# Patient Record
Sex: Male | Born: 1935 | Race: White | Hispanic: No | Marital: Married | Smoking: Former smoker
Health system: Southern US, Community
[De-identification: ages and names within clinical notes are randomized; demographics above are authoritative.]

## PROBLEM LIST (undated history)

## (undated) DIAGNOSIS — N189 Chronic kidney disease, unspecified: Secondary | ICD-10-CM

## (undated) DIAGNOSIS — C801 Malignant (primary) neoplasm, unspecified: Secondary | ICD-10-CM

## (undated) DIAGNOSIS — I10 Essential (primary) hypertension: Secondary | ICD-10-CM

## (undated) DIAGNOSIS — C7800 Secondary malignant neoplasm of unspecified lung: Secondary | ICD-10-CM

## (undated) DIAGNOSIS — C159 Malignant neoplasm of esophagus, unspecified: Secondary | ICD-10-CM

## (undated) DIAGNOSIS — C61 Malignant neoplasm of prostate: Secondary | ICD-10-CM

## (undated) DIAGNOSIS — Z5189 Encounter for other specified aftercare: Secondary | ICD-10-CM

## (undated) DIAGNOSIS — M1990 Unspecified osteoarthritis, unspecified site: Secondary | ICD-10-CM

## (undated) DIAGNOSIS — C649 Malignant neoplasm of unspecified kidney, except renal pelvis: Secondary | ICD-10-CM

## (undated) DIAGNOSIS — D649 Anemia, unspecified: Secondary | ICD-10-CM

## (undated) HISTORY — DX: Malignant neoplasm of esophagus, unspecified: C15.9

## (undated) HISTORY — PX: COLON SURGERY: SHX602

## (undated) HISTORY — PX: BLADDER SURGERY: SHX569

## (undated) HISTORY — PX: VASCULAR SURGERY: SHX849

## (undated) HISTORY — PX: JOINT REPLACEMENT: SHX530

## (undated) HISTORY — DX: Chronic kidney disease, unspecified: N18.9

## (undated) HISTORY — PX: APPENDECTOMY: SHX54

## (undated) HISTORY — DX: Malignant (primary) neoplasm, unspecified: C80.1

## (undated) HISTORY — PX: ARCUATE KERATECTOMY: SHX212

## (undated) HISTORY — DX: Encounter for other specified aftercare: Z51.89

## (undated) HISTORY — PX: NEPHRECTOMY: SHX65

## (undated) HISTORY — PX: PROSTATE BIOPSY: SHX241

## (undated) HISTORY — DX: Unspecified osteoarthritis, unspecified site: M19.90

## (undated) HISTORY — DX: Essential (primary) hypertension: I10

---

## 1999-03-30 ENCOUNTER — Encounter: Payer: Self-pay | Admitting: Orthopaedic Surgery

## 1999-04-02 ENCOUNTER — Inpatient Hospital Stay (HOSPITAL_COMMUNITY): Admission: RE | Admit: 1999-04-02 | Discharge: 1999-04-06 | Payer: Self-pay | Admitting: Orthopaedic Surgery

## 1999-04-06 ENCOUNTER — Inpatient Hospital Stay (HOSPITAL_COMMUNITY)
Admission: RE | Admit: 1999-04-06 | Discharge: 1999-04-11 | Payer: Self-pay | Admitting: Physical Medicine & Rehabilitation

## 1999-04-09 ENCOUNTER — Encounter: Payer: Self-pay | Admitting: Physical Medicine & Rehabilitation

## 1999-04-16 ENCOUNTER — Encounter
Admission: RE | Admit: 1999-04-16 | Discharge: 1999-05-25 | Payer: Self-pay | Admitting: Physical Medicine & Rehabilitation

## 1999-08-20 ENCOUNTER — Encounter: Payer: Self-pay | Admitting: Orthopaedic Surgery

## 1999-08-20 ENCOUNTER — Inpatient Hospital Stay (HOSPITAL_COMMUNITY): Admission: RE | Admit: 1999-08-20 | Discharge: 1999-08-26 | Payer: Self-pay | Admitting: Orthopaedic Surgery

## 1999-09-09 ENCOUNTER — Encounter: Admission: RE | Admit: 1999-09-09 | Discharge: 1999-10-07 | Payer: Self-pay | Admitting: Orthopaedic Surgery

## 2000-09-19 ENCOUNTER — Encounter: Payer: Self-pay | Admitting: Orthopaedic Surgery

## 2000-09-22 ENCOUNTER — Inpatient Hospital Stay (HOSPITAL_COMMUNITY): Admission: RE | Admit: 2000-09-22 | Discharge: 2000-09-27 | Payer: Self-pay | Admitting: Orthopaedic Surgery

## 2000-10-06 ENCOUNTER — Ambulatory Visit (HOSPITAL_COMMUNITY): Admission: RE | Admit: 2000-10-06 | Discharge: 2000-10-06 | Payer: Self-pay | Admitting: Orthopaedic Surgery

## 2001-03-30 ENCOUNTER — Other Ambulatory Visit: Admission: RE | Admit: 2001-03-30 | Discharge: 2001-03-30 | Payer: Self-pay | Admitting: Urology

## 2001-03-30 ENCOUNTER — Encounter (INDEPENDENT_AMBULATORY_CARE_PROVIDER_SITE_OTHER): Payer: Self-pay | Admitting: Specialist

## 2001-04-10 ENCOUNTER — Encounter: Admission: RE | Admit: 2001-04-10 | Discharge: 2001-04-10 | Payer: Self-pay | Admitting: Urology

## 2001-04-10 ENCOUNTER — Encounter: Payer: Self-pay | Admitting: Urology

## 2001-04-14 ENCOUNTER — Ambulatory Visit: Admission: RE | Admit: 2001-04-14 | Discharge: 2001-07-13 | Payer: Self-pay | Admitting: Radiation Oncology

## 2001-04-27 ENCOUNTER — Encounter: Admission: RE | Admit: 2001-04-27 | Discharge: 2001-04-27 | Payer: Self-pay | Admitting: Urology

## 2001-04-27 ENCOUNTER — Encounter: Payer: Self-pay | Admitting: Urology

## 2001-06-12 ENCOUNTER — Encounter: Payer: Self-pay | Admitting: Urology

## 2001-06-12 ENCOUNTER — Ambulatory Visit (HOSPITAL_COMMUNITY): Admission: RE | Admit: 2001-06-12 | Discharge: 2001-06-12 | Payer: Self-pay | Admitting: Urology

## 2001-06-27 ENCOUNTER — Encounter: Admission: RE | Admit: 2001-06-27 | Discharge: 2001-06-27 | Payer: Self-pay | Admitting: Urology

## 2001-06-27 ENCOUNTER — Encounter: Payer: Self-pay | Admitting: Urology

## 2001-07-28 ENCOUNTER — Ambulatory Visit (HOSPITAL_BASED_OUTPATIENT_CLINIC_OR_DEPARTMENT_OTHER): Admission: RE | Admit: 2001-07-28 | Discharge: 2001-07-28 | Payer: Self-pay | Admitting: Urology

## 2001-08-18 ENCOUNTER — Ambulatory Visit: Admission: RE | Admit: 2001-08-18 | Discharge: 2001-11-16 | Payer: Self-pay | Admitting: Radiation Oncology

## 2003-03-15 ENCOUNTER — Encounter: Payer: Self-pay | Admitting: Orthopaedic Surgery

## 2003-03-15 ENCOUNTER — Encounter: Admission: RE | Admit: 2003-03-15 | Discharge: 2003-03-15 | Payer: Self-pay | Admitting: Orthopaedic Surgery

## 2003-04-17 ENCOUNTER — Encounter: Payer: Self-pay | Admitting: Vascular Surgery

## 2003-04-18 ENCOUNTER — Ambulatory Visit (HOSPITAL_COMMUNITY): Admission: RE | Admit: 2003-04-18 | Discharge: 2003-04-18 | Payer: Self-pay | Admitting: Vascular Surgery

## 2003-04-26 ENCOUNTER — Encounter: Payer: Self-pay | Admitting: Vascular Surgery

## 2003-04-26 ENCOUNTER — Encounter (INDEPENDENT_AMBULATORY_CARE_PROVIDER_SITE_OTHER): Payer: Self-pay | Admitting: Specialist

## 2003-04-26 ENCOUNTER — Inpatient Hospital Stay (HOSPITAL_COMMUNITY): Admission: RE | Admit: 2003-04-26 | Discharge: 2003-05-02 | Payer: Self-pay | Admitting: Vascular Surgery

## 2003-04-27 ENCOUNTER — Encounter: Payer: Self-pay | Admitting: Vascular Surgery

## 2003-05-07 ENCOUNTER — Encounter: Payer: Self-pay | Admitting: Vascular Surgery

## 2003-05-07 ENCOUNTER — Inpatient Hospital Stay (HOSPITAL_COMMUNITY): Admission: EM | Admit: 2003-05-07 | Discharge: 2003-05-22 | Payer: Self-pay | Admitting: Emergency Medicine

## 2003-05-09 ENCOUNTER — Encounter: Payer: Self-pay | Admitting: Vascular Surgery

## 2003-05-11 ENCOUNTER — Encounter: Payer: Self-pay | Admitting: Vascular Surgery

## 2003-05-13 ENCOUNTER — Encounter: Payer: Self-pay | Admitting: *Deleted

## 2003-05-16 ENCOUNTER — Encounter: Payer: Self-pay | Admitting: Vascular Surgery

## 2003-05-17 ENCOUNTER — Encounter: Payer: Self-pay | Admitting: Vascular Surgery

## 2008-06-25 ENCOUNTER — Encounter (INDEPENDENT_AMBULATORY_CARE_PROVIDER_SITE_OTHER): Payer: Self-pay | Admitting: General Surgery

## 2008-06-25 ENCOUNTER — Inpatient Hospital Stay (HOSPITAL_COMMUNITY): Admission: RE | Admit: 2008-06-25 | Discharge: 2008-06-30 | Payer: Self-pay | Admitting: General Surgery

## 2008-07-24 ENCOUNTER — Encounter: Payer: Self-pay | Admitting: General Surgery

## 2008-07-25 ENCOUNTER — Ambulatory Visit (HOSPITAL_COMMUNITY): Admission: RE | Admit: 2008-07-25 | Discharge: 2008-07-25 | Payer: Self-pay | Admitting: General Surgery

## 2010-04-02 ENCOUNTER — Ambulatory Visit (HOSPITAL_COMMUNITY): Admission: RE | Admit: 2010-04-02 | Discharge: 2010-04-02 | Payer: Self-pay | Admitting: Urology

## 2010-05-05 ENCOUNTER — Ambulatory Visit: Payer: Self-pay | Admitting: Cardiovascular Disease

## 2010-05-25 ENCOUNTER — Ambulatory Visit (HOSPITAL_COMMUNITY): Admission: RE | Admit: 2010-05-25 | Discharge: 2010-05-25 | Payer: Self-pay | Admitting: Urology

## 2010-06-04 ENCOUNTER — Inpatient Hospital Stay (HOSPITAL_COMMUNITY): Admission: RE | Admit: 2010-06-04 | Discharge: 2010-06-11 | Payer: Self-pay | Admitting: Urology

## 2010-06-04 ENCOUNTER — Encounter (INDEPENDENT_AMBULATORY_CARE_PROVIDER_SITE_OTHER): Payer: Self-pay | Admitting: Urology

## 2010-10-15 ENCOUNTER — Other Ambulatory Visit: Payer: Self-pay | Admitting: Urology

## 2010-10-15 ENCOUNTER — Encounter (HOSPITAL_COMMUNITY): Payer: Medicare Other

## 2010-10-15 ENCOUNTER — Other Ambulatory Visit (HOSPITAL_COMMUNITY): Payer: Self-pay | Admitting: Urology

## 2010-10-15 ENCOUNTER — Ambulatory Visit (HOSPITAL_COMMUNITY)
Admission: RE | Admit: 2010-10-15 | Discharge: 2010-10-15 | Disposition: A | Discharge status: Home / Self Care | Payer: Medicare Other | Source: Ambulatory Visit | Attending: Urology | Admitting: Urology

## 2010-10-15 DIAGNOSIS — Z7982 Long term (current) use of aspirin: Secondary | ICD-10-CM | POA: Insufficient documentation

## 2010-10-15 DIAGNOSIS — I10 Essential (primary) hypertension: Secondary | ICD-10-CM | POA: Insufficient documentation

## 2010-10-15 DIAGNOSIS — Z8546 Personal history of malignant neoplasm of prostate: Secondary | ICD-10-CM | POA: Insufficient documentation

## 2010-10-15 DIAGNOSIS — Z01818 Encounter for other preprocedural examination: Secondary | ICD-10-CM | POA: Insufficient documentation

## 2010-10-15 DIAGNOSIS — Z8553 Personal history of malignant neoplasm of renal pelvis: Secondary | ICD-10-CM | POA: Insufficient documentation

## 2010-10-15 DIAGNOSIS — Z01811 Encounter for preprocedural respiratory examination: Secondary | ICD-10-CM

## 2010-10-15 DIAGNOSIS — Z79899 Other long term (current) drug therapy: Secondary | ICD-10-CM | POA: Insufficient documentation

## 2010-10-15 DIAGNOSIS — E7800 Pure hypercholesterolemia, unspecified: Secondary | ICD-10-CM | POA: Insufficient documentation

## 2010-10-15 DIAGNOSIS — Z01812 Encounter for preprocedural laboratory examination: Secondary | ICD-10-CM | POA: Insufficient documentation

## 2010-10-15 LAB — CBC
HCT: 40.2 % (ref 39.0–52.0)
MCV: 88.4 fL (ref 78.0–100.0)
RBC: 4.55 MIL/uL (ref 4.22–5.81)
RDW: 13.9 % (ref 11.5–15.5)
WBC: 6.5 10*3/uL (ref 4.0–10.5)

## 2010-10-15 LAB — BASIC METABOLIC PANEL
BUN: 18 mg/dL (ref 6–23)
Chloride: 104 mEq/L (ref 96–112)
Glucose, Bld: 92 mg/dL (ref 70–99)
Potassium: 4.2 mEq/L (ref 3.5–5.1)

## 2010-10-22 ENCOUNTER — Ambulatory Visit (HOSPITAL_COMMUNITY)
Admission: RE | Admit: 2010-10-22 | Discharge: 2010-10-22 | Disposition: A | Discharge status: Home / Self Care | Payer: Medicare Other | Source: Ambulatory Visit | Attending: Urology | Admitting: Urology

## 2010-10-22 ENCOUNTER — Other Ambulatory Visit: Payer: Self-pay | Admitting: Urology

## 2010-10-22 DIAGNOSIS — R82998 Other abnormal findings in urine: Secondary | ICD-10-CM | POA: Insufficient documentation

## 2010-10-22 DIAGNOSIS — I10 Essential (primary) hypertension: Secondary | ICD-10-CM | POA: Insufficient documentation

## 2010-10-22 DIAGNOSIS — Z8559 Personal history of malignant neoplasm of other urinary tract organ: Secondary | ICD-10-CM | POA: Insufficient documentation

## 2010-10-23 NOTE — Op Note (Signed)
[name], [name]              ACCOUNT NO.:  [id number]  MEDICAL RECORD NO.:  [id number]           PATIENT TYPE:  O  LOCATION:  DAYL                         FACILITY:  [facility]  PHYSICIAN:  [name], MD      DATE OF BIRTH:  [birth year]  DATE OF PROCEDURE:  10/22/2010 DATE OF DISCHARGE:                              OPERATIVE REPORT   PREOPERATIVE DIAGNOSES: 1. 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 2. Positive urine cytology.  POSTOPERATIVE DIAGNOSES: 1. 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 2. Positive urine cytology.  PROCEDURES: 1. Cystoscopy. 2. Right retrograde pyelography with interpretation. 3. Site selected bladder biopsies.  SURGEON:  [name], MD  ANESTHESIA:  General.  COMPLICATIONS:  None.  ESTIMATED BLOOD LOSS:  Minimal.  INTRAOPERATIVE FINDINGS:  Right retrograde pyelography demonstrated a normal-caliber ureter and renal collecting system without evidence for filling defects.  HISTORY/INDICATION:  [name] is an 77 year old gentleman with a history of high-grade urothelial carcinoma of the left renal pelvis status post a left nephroureterectomy.  He recently underwent surveillance evaluation with cystoscopy which did not appear to be remarkable and with no obvious recurrent bladder tumors.  However, his bladder washing for cytology was positive for malignancy.  I, therefore, recommended further evaluation at this time with the above procedures. The potential risks, complications, and alternative treatment options were discussed in detail and informed consent was obtained.  DESCRIPTION OF PROCEDURE:  The patient was taken to the operating room and general anesthetic was administered.  He was given preoperative antibiotics, placed in the dorsal lithotomy position, and prepped and draped in the usual sterile fashion.  Next, a preoperative time-out was performed.  Cystourethroscopy was then performed  which revealed a normal anterior urethra.  The prostatic urethra was notable for bilateral lateral lobe hypertrophy with some neovascularity within the prostatic urethra.  Inspection of the bladder systematically revealed no clear bladder tumors.  However, there was an area that did demonstrate a very small, approximately 0.5-cm area with possible early papillary tumor recurrence.  No other abnormal mucosal abnormalities were identified. Attention then turned to the right ureteral orifice which was in the normal anatomic position.  It was cannulated with a 6-French ureteral catheter and Omnipaque contrast was injected.  This revealed no abnormal findings of the ureteral or renal collecting system.  The left ureteral orifice was notably absent secondary to his prior nephroureterectomy. At this point, attention returned to the bladder.  The cold cup biopsy forceps were used to remove the previously mentioned small possible papillary recurrence.  Additional site selected biopsies were then performed from the left trigone near the site of his previous ureteral orifice, posterior bladder, and right bladder.  The papillary recurrence of the area of possible concern with papillary recurrence was noted on the left side of the bladder.  All of these areas were then carefully fulgurated with no remaining abnormal mucosa identified.  The bladder was emptied and reinspected and hemostasis continued to be excellent. The bladder was, therefore, emptied and the procedure ended.  He tolerated the procedure well and without complications.  He was able  to be awakened and transferred to the recovery unit in satisfactory condition.     [name], MD     LB/MEDQ  D:  10/22/2010  T:  10/23/2010  Job:  [id number]  Electronically Signed by [name] MD on 10/23/2010 11:14:58 PM

## 2010-10-28 LAB — CBC
HCT: 31.5 % — ABNORMAL LOW (ref 39.0–52.0)
HCT: 41.3 % (ref 39.0–52.0)
HCT: 43 % (ref 39.0–52.0)
Hemoglobin: 12.2 g/dL — ABNORMAL LOW (ref 13.0–17.0)
Hemoglobin: 14.7 g/dL (ref 13.0–17.0)
MCH: 30.8 pg (ref 26.0–34.0)
MCH: 31.3 pg (ref 26.0–34.0)
MCHC: 34 g/dL (ref 30.0–36.0)
MCV: 89.3 fL (ref 78.0–100.0)
MCV: 89.6 fL (ref 78.0–100.0)
Platelets: 251 10*3/uL (ref 150–400)
Platelets: 254 10*3/uL (ref 150–400)
RBC: 3.53 MIL/uL — ABNORMAL LOW (ref 4.22–5.81)
RBC: 3.97 MIL/uL — ABNORMAL LOW (ref 4.22–5.81)
RBC: 4.72 MIL/uL (ref 4.22–5.81)
RDW: 14 % (ref 11.5–15.5)
WBC: 4.1 10*3/uL (ref 4.0–10.5)
WBC: 5.1 10*3/uL (ref 4.0–10.5)
WBC: 7.1 10*3/uL (ref 4.0–10.5)

## 2010-10-28 LAB — COMPREHENSIVE METABOLIC PANEL
ALT: 24 U/L (ref 0–53)
ALT: 23 U/L (ref 0–53)
Alkaline Phosphatase: 51 U/L (ref 39–117)
BUN: 7 mg/dL (ref 6–23)
BUN: 9 mg/dL (ref 6–23)
CO2: 25 mEq/L (ref 19–32)
Calcium: 8.9 mg/dL (ref 8.4–10.5)
Chloride: 107 mEq/L (ref 96–112)
GFR calc non Af Amer: >60 mL/min (ref >60)
Glucose, Bld: 116 mg/dL — ABNORMAL HIGH (ref 70–99)
Glucose, Bld: 97 mg/dL (ref 70–99)
Potassium: 3.6 mEq/L (ref 3.5–5.1)
Sodium: 136 mEq/L (ref 135–145)
Sodium: 139 mEq/L (ref 135–145)
Total Bilirubin: 0.7 mg/dL (ref 0.3–1.2)
Total Protein: 4.4 g/dL — ABNORMAL LOW (ref 6.0–8.3)
Total Protein: 6.7 g/dL (ref 6.0–8.3)

## 2010-10-28 LAB — SURGICAL PCR SCREEN
MRSA, PCR: NEGATIVE
MRSA, PCR: NEGATIVE
Staphylococcus aureus: NEGATIVE

## 2010-10-28 LAB — BASIC METABOLIC PANEL
BUN: 13 mg/dL (ref 6–23)
BUN: 9 mg/dL (ref 6–23)
CO2: 26 mEq/L (ref 19–32)
CO2: 28 mEq/L (ref 19–32)
CO2: 28 mEq/L (ref 19–32)
CO2: 30 mEq/L (ref 19–32)
CO2: 32 mEq/L (ref 19–32)
Calcium: 8 mg/dL — ABNORMAL LOW (ref 8.4–10.5)
Calcium: 8.1 mg/dL — ABNORMAL LOW (ref 8.4–10.5)
Chloride: 101 mEq/L (ref 96–112)
Chloride: 103 mEq/L (ref 96–112)
Chloride: 104 mEq/L (ref 96–112)
Chloride: 105 mEq/L (ref 96–112)
Chloride: 93 mEq/L — ABNORMAL LOW (ref 96–112)
Chloride: 97 mEq/L (ref 96–112)
Chloride: 99 mEq/L (ref 96–112)
Creatinine, Ser: 1.2 mg/dL (ref 0.4–1.5)
Creatinine, Ser: 1.23 mg/dL (ref 0.4–1.5)
Creatinine, Ser: 1.26 mg/dL (ref 0.4–1.5)
GFR calc Af Amer: >60 mL/min (ref >60)
GFR calc Af Amer: >60 mL/min (ref >60)
GFR calc Af Amer: >60 mL/min (ref >60)
GFR calc Af Amer: >60 mL/min (ref >60)
GFR calc Af Amer: >60 mL/min (ref >60)
GFR calc non Af Amer: 50 mL/min — ABNORMAL LOW (ref >60)
GFR calc non Af Amer: >60 mL/min (ref >60)
Glucose, Bld: 114 mg/dL — ABNORMAL HIGH (ref 70–99)
Glucose, Bld: 132 mg/dL — ABNORMAL HIGH (ref 70–99)
Potassium: 3.6 mEq/L (ref 3.5–5.1)
Potassium: 3.6 mEq/L (ref 3.5–5.1)
Potassium: 4.2 mEq/L (ref 3.5–5.1)
Potassium: 4.2 mEq/L (ref 3.5–5.1)
Potassium: 4.4 mEq/L (ref 3.5–5.1)
Potassium: 4.1 mEq/L (ref 3.5–5.1)
Sodium: 130 mEq/L — ABNORMAL LOW (ref 135–145)
Sodium: 132 mEq/L — ABNORMAL LOW (ref 135–145)
Sodium: 132 mEq/L — ABNORMAL LOW (ref 135–145)
Sodium: 135 mEq/L (ref 135–145)
Sodium: 136 mEq/L (ref 135–145)
Sodium: 138 mEq/L (ref 135–145)

## 2010-10-28 LAB — ABO/RH: ABO/RH(D): A POS

## 2010-10-28 LAB — HEMOGLOBIN AND HEMATOCRIT, BLOOD
HCT: 35.6 % — ABNORMAL LOW (ref 39.0–52.0)
HCT: 36.9 % — ABNORMAL LOW (ref 39.0–52.0)
Hemoglobin: 12.2 g/dL — ABNORMAL LOW (ref 13.0–17.0)

## 2010-10-28 LAB — MAGNESIUM
Magnesium: 2 mg/dL (ref 1.5–2.5)
Magnesium: 2 mg/dL (ref 1.5–2.5)

## 2010-10-28 LAB — TYPE AND SCREEN: Antibody Screen: NEGATIVE

## 2010-10-28 LAB — POCT I-STAT 4, (NA,K, GLUC, HGB,HCT): Glucose, Bld: 114 mg/dL — ABNORMAL HIGH (ref 70–99)

## 2010-12-29 NOTE — Op Note (Signed)
NAMEYOSIEL, THIEME              ACCOUNT NO.:  [id number]   MEDICAL RECORD NO.:  [id number]          PATIENT TYPE:  INP   LOCATION:  5123                         FACILITY:  [facility]   PHYSICIAN:  Ollen Gross. Vernell Morgans, M.D. DATE OF BIRTH:  [birth year]   DATE OF PROCEDURE:  DATE OF DISCHARGE:                               OPERATIVE REPORT   PREOPERATIVE DIAGNOSIS:  Right colon mass x2.   POSTOPERATIVE DIAGNOSIS:  Right colon mass x2.   PROCEDURES:  Exploratory laparotomy, lysis of adhesions, right colectomy  with primary anastomosis.   SURGEON:  Ollen Gross. Vernell Morgans, MD   ASSISTANT:  1. Alfonse Ras, MD  2. Lorne Skeens. Hoxworth, MD   ANESTHESIA:  General endotracheal.   DESCRIPTION OF PROCEDURE:  After informed consent was obtained, the  patient was brought to the operating room and placed in the supine  position on the operating table.  After an induction of general  anesthesia, the patient's abdomen was prepped with Betadine and draped  in the usual sterile manner.  A midline incision was made with a 10-  blade knife through the patient's old incision.  This incision was  carried down through the skin and the subcutaneous tissue sharply with  electrocautery until the abdominal wall fascia was identified.  The  patient did have some small hernias along the most of the upper portion  of the incision.  The fascia was incised sharply with electrocautery.  The preperitoneal space was probed bluntly with hemostat until we were  able to identify the abdominal cavity.  We opened the peritoneum.  The  rest of the incision was then opened under direct vision.  The patient  had some omental adhesions up high, which were taken down sharply with  electrocautery.  Along the lower portion of the incision, the patient  had some small bowel adhesions, which were taken down sharply with  Metzenbaum scissors.  Once this was accomplished, we were able to then  identify the right colon.  The right  colon and small intestine were  mobilized by incising the retroperitoneal attachment along the white  line of Toldt.  Once we were able to do this, the patient also had some  distal small bowel adhesions down the pelvis.  These were also freed up  sharply with the Metzenbaum scissors.  Once this was accomplished, we  were able to bring the terminal ileum and right colon up into the wound.  The 2 areas were tattooed and the other area were identified by  palpation.  A site was chosen at the terminal ileum for division of the  small bowel.  The mesentery at this point was opened sharply with  electrocautery.  The GIA 75 staple was placed across the small bowel, at  this point clamped and fired thereby dividing the bowel between staple  lines.  A site was then chosen at the hepatic flexure for division of  the colon.  The mesentery at this point was opened sharply with  electrocautery.  GIA 75 stapler was placed across the colon at this  point, clamped and fired  thereby dividing the colon between staple  lines.  The mesentery to the right colon was then taken down by  combination with use of the LigaSure, and then the main vessels were  clamped with Kelly clamps, divided and doubly ligated with 2-0 silk  ties.  Once this was accomplished, a right colon was removed.  It was  taken on the back table and opened.  The 2 lesions were identified and  then it was sent to pathology for further evaluation.  The small bowel  and transverse colon approximated easily.  They were held in close  approximation with 2-0 silk stitches.  Small enterotomies were made at  the antimesenteric border of the small bowel and colon near the staple  lines.  These were opened with a hemostat.  Each limb of a GIA 75  stapler was then placed down the appropriate limb of small bowel and  colon and clamped and fired, thereby creating a nice widely patent  enteroenterostomy.  The common enterotomy was closed with a TA-60   stapler.  The mesenteric defect was closed with interrupted 2-0 silk  stitches.  The anastomosis was widely patent and appeared to be  hemostatic and healthy without any tension.  The abdomen was then  irrigated with copious amounts of saline.  At this point, the hernia  sacs were excised sharply with electrocautery.  The fascial edges were  identified.  The subcutaneous dissection was then performed on top of  the fascia to mobilize the fascial edges, so that they would meet at the  midline easily without any tension.  Once this was accomplished, the  fascia of the anterior abdominal wall was closed with 2 running #1  looped PDS sutures.  The subcutaneous tissue was irrigated with copious  amounts of saline and Betadine.  The subcutaneous tissue was then closed  with a running 2-0 Vicryl stitch, and the skin was closed with staples.  Betadine ointment and sterile dressings were applied.  The patient  tolerated the procedure well.  At the end of the case, all needle,  sponge, and instrument counts were correct.  The patient was then awaken  and taken to recovery room in stable condition.      Ollen Gross. Vernell Morgans, M.D.  Electronically Signed     PST/MEDQ  D:  06/25/2008  T:  06/26/2008  Job:  [id number]

## 2011-01-01 NOTE — Op Note (Signed)
NAME:  JEREMIE, GIANGRANDE NO.:  [id number]   MEDICAL RECORD NO.:  [id number]                   PATIENT TYPE:  INP   LOCATION:  2307                                 FACILITY:  [facility]   PHYSICIAN:  Quita Skye. Hart Rochester, M.D.               DATE OF BIRTH:  14-Sep-1926   DATE OF PROCEDURE:  04/26/2003  DATE OF DISCHARGE:                                 OPERATIVE REPORT   PREOPERATIVE DIAGNOSIS:  Infrarenal abdominal aortic aneurysm.   POSTOPERATIVE DIAGNOSIS:  Infrarenal abdominal aortic aneurysm.   OPERATION:  1. Resection and grafting of infrarenal abdominal aortic aneurysm with     insertion of an aorto-bi-common iliac graft using an 18 x 9 mm Hemashield     Dacron graft.  2. Reimplantation of accessory lower right renal artery into aortic graft.   SURGEON:  Quita Skye. Hart Rochester, M.D.   FIRST ASSISTANT:  Coral Ceo, P.A.   ANESTHESIA:  General endotracheal.   DESCRIPTION OF PROCEDURE:  The patient was taken to the operating room,  placed in the supine position, at which time satisfactory general  endotracheal anesthesia was administered.  A Swan-Ganz catheter and radial  arterial line were inserted by anesthesia.  The abdomen and groins were  prepped with Betadine scrub and solution and draped in the routine sterile  manner.  A midline incision was made in the abdomen and carried down from  the xiphoid to just above the pubis, carried down through subcutaneous  tissue and linea alba using the Bovie.  The peritoneal cavity was entered  and thoroughly explored.  The stomach, duodenum, small bowel, and colon were  unremarkable.  The liver was smooth with no palpable masses.  The  gallbladder appeared normal, and no stones were palpable.  The transverse  colon was elevated and the intestines were reflected to the right side,  retroperitoneum incised, exposing the aorta from the renal arteries to the  bifurcation.  The control of the aneurysm was obtained  proximal and distal  to the main renal arteries, and there was a second renal artery on the right  which arose from the aneurysm and was about 2.5 mm in size and was  preserved.  The neck of the aneurysm was very short with an extensive  prominent bulge extending to the patient's left side, extending up to within  a centimeter of the left renal artery.  Both common iliac arteries had some  mild posterior plaquing but were widely patent and not aneurysmal.  The  inferior mesenteric artery was occluded.  The patient was given 25 g of  mannitol and then heparinized.  The aorta was occluded distal to the main  renal arteries and both common iliac arteries occluded with vascular clamps.  The aneurysm was opened anteriorly, a large amount of laminated thrombus  removed, and the lumbars oversewn with 2-0 silk figure-of-eight sutures from  within.  The neck of the  aneurysm was transected, preserving the accessory  right renal artery for later reimplantation.  An 18 x 9 mm Hemashield Dacron  graft was anastomosed end-to-end to the aortic stump using continuous 3-0  Prolene, buttressing this with a strip of felt.  This was checked for leaks,  and none were present.  Following this, end-to-end anastomoses were done  between the limbs and the common iliac arteries bilaterally with 5-0  Prolene.  The left leg was opened initially, followed by the right leg, with  no significant hypotension.  The accessory right renal artery was then  slightly spatulated and anastomosed end-to-side to the anterolateral aspect  of the proximal portion of the aortic graft after opening it with an 11  blade and enlarging it with the 4 mm punch.  After this was completed,  clamps were released and there was an excellent pulse in all vessels.  Protamine was given to reverse the heparin.  Following adequate hemostasis,  wounds were irrigated  with saline, closed in layers with Vicryl and clips, sterile dressing  applied, and  the patient taken to the recovery room in satisfactory  condition.  The patient received three units of blood from the [facility],  no blood from the blood bank.  He had an excellent urinary output and was  stable hemodynamically throughout the case.                                               Quita Skye Hart Rochester, M.D.    JDL/MEDQ  D:  04/26/2003  T:  04/27/2003  Job:  [id number]

## 2011-01-01 NOTE — Procedures (Signed)
[place]  Patient:    [name], [name]                     MRN: [id number] Proc. Date: 09/22/00 Adm. Date:  [id number] Attending:  Randolm Idol CC:         [name]. [name], M.D.  Department of Anesthesia   Procedure Report  HISTORY:  [name] is a 77 year old white male with a diagnosis of severe degenerative joint disease, who is scheduled for a left total knee replacement and for whom epidural analgesia in the postoperative has been requested as part of medical-surgical management, explained, understood, and accepted preoperatively.  DESCRIPTION OF PROCEDURE:  At the termination of surgery, while still under general anesthesia, [name] is turned to the left lateral decubitus position.  His back was prepped with Betadine and draped in the usual sterile fashion.  Using midline approach and loss of resistance technique, the epidural tap was accomplished at the T12-L1 interspace with a 17-gauge Tuohy needle.  After aspiration for blood and CSF were negative, a total of 10 cc of 1% lidocaine with epinephrine and 100 mcg of fentanyl was introduced with no problems.  This was followed by the passage of a peridural catheter 10 cm cephalad.  The catheter was then checked for patency and affixed to the patients back.  The patient was then returned to the supine position, awakened, and brought to the postanesthesia care unit, where his peridural catheter will be connected to an infusion pump with a mixture of fentanyl 5 mcg/cc and Marcaine one-sixteenth percent at an initial rate of 12 cc/hr., to be adjusted as needed.  There were no complications.  He did well and will be followed in the usual fashion. DD:  09/22/00 TD:  09/23/00 Job: [id number]

## 2011-01-01 NOTE — H&P (Signed)
[place]. [facility]  Patient:    [name], [name]                  MRN: [id number] Adm. Date:  09/22/00 Attending:  [name]. [name], M.D. Dictator:   Arnoldo Morale, P.A.                         History and Physical  DATE OF BIRTH: 02/08/1927  CHIEF COMPLAINT: Left knee pain for the last two to three years.  HISTORY OF PRESENT ILLNESS: This patient is a 77 year old white male, a patient of Dr. Serita Grit, who presents with a history of a right total knee in August 2000 and right total hip in January 2001 by Dr. [name].  His current complaint is about a two to three year history of left knee pain.  He has no history of any injury or prior surgery on the left knee and the pain was kind of gradual in onset and has been getting progressively worse.  The pain is described as a grinding dull type of pain that is present with any weightbearing on the leg but otherwise intermittent.  The pain seems to be concentrated across the joint line and does not seem to radiate.  The pain increases with any weightbearing and nothing really seems to alleviate the pain.  He is currently taking Celebrex 400 mg q.d. with moderate relief of his pain.  The knee does pop and grind, and he does wear a pullover sleeve to give him some stability when he walks.  The knee feels like it might give way at times but otherwise it has no swelling, there is no night pain, or locking. He is currently riding an exercise bike to help strengthen his lower extremities.  He does not require the use of any assistive devices to walk. He has received a cortisone shot in the left knee in the past and that provided really no relief.  ALLERGIES:  1. PENICILLIN caused a rash.  2. MORPHINE caused extreme jitteryness.  CURRENT MEDICATIONS:  1. Norvasc 5 mg 1 tablet p.o. q.d.  2. Zocor 10 mg 1 tablet p.o. q.p.m.  3. Celebrex 200 mg 1 tablet p.o. b.i.d.  4. Vitamin E 400 IU 1 tablet p.o.  q.d.  5. Vitamin Allbee with C 1 tablet p.o. q.d.  PAST MEDICAL HISTORY:  1. He has been treated for hypercholesterolemia for several years.  2. He has been diagnosed with hypertension the last three to six months, and     that is being treated by Dr. Andrey Campanile.  3. He does have a history of osteoarthritis.  He denies any history of diabetes mellitus, thyroid disease, hiatal hernia, peptic ulcer disease, heart disease, asthma, or any other medical condition other than noted previously.  PAST SURGICAL HISTORY:  1. Appendectomy in 1939 by Dr. Corinda Gubler.  2. Right vein stripping in 1968 by Dr. Langston Masker.  3. 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 Janann August.  4. Left vein stripping in 1988 by Dr. Maple Hudson.  5. Right total knee arthroplasty by Dr. [name]. Whitfield in August 2000.  6. Right total hip arthroplasty by Dr. [name]. Whitfield in January 2001.  SOCIAL HISTORY: The patient has a 20-40 pack year history of cigarette smoking and quit in 1984.  He drinks alcohol, about one to two ounces of alcohol about four to five times a week.  He does not use  any drugs.  He is married and lives with his wife in a one-story house, with one to two steps into the main entrance.  He has one daughter alive and well.  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  His medical doctor is Dr. Margrett Rud (phone number [phone]).  FAMILY HISTORY: His mother died at the age of 6 with congestive heart failure.  His father died at the age of 92 with just generalized decline in health after a hip fracture.  He has one brother, age 101, alive and well, and has one brother age 67 who passed away due to complications from diabetes.  He has one daughter, age 19, alive and well with no major medical problems.  REVIEW OF SYSTEMS: He does wear glasses.  He has a partial plate on both the upper and lower jaw line.  He complains of frequent sinus congestion and  nasal drip.  He has had an elevated PSA several times in the past and most recently his PSA was elevated at 4.6.  He has seen Dr. Andrey Campanile for that and Dr. Andrey Campanile has referred him to Dr. Patsi Sears, who will see the patient in March 2002. He does complain of some osteoarthritis in the right thumb.  He denies any other problems and all other systems are negative and noncontributory at this time.  PHYSICAL EXAMINATION:  GENERAL: This patient is a well-developed, well-nourished, elderly white male in no acute distress.  He walks without a limp.  Mood and affect are appropriate.  He talks easily with the examiner.  He is accompanied by his wife.  VITAL SIGNS: Height 6 feet 0 inches.  Weight 227 pounds.  BMI 30.  Temperature 98.4 degrees Fahrenheit, pulse 96, respirations 16, blood pressure 136/96.  HEENT: Head normocephalic, atraumatic, without frontal or maxillary sinus tenderness to palpation.  Conjunctivae pink.  Sclerae anicteric.  Pupils are about 2 mm bilaterally, PERRLA.  EOMI.  Funduscopic examination shows visible red reflex with normal retinal vasculature, optic disc and cup.  No visible external ear deformities.  Hearing grossly intact.  Tympanic membranes pearly gray bilaterally with good light reflex.  Nose and nasal septum midline. Nasal mucosa pink and moist without exudate or polyp noted.  Buccal mucosa pink and moist, with good dentition.  Pharynx without erythema or exudate. Tongue and uvula are midline.  Tongue without fasciculations and uvula rises equally with phonation.  NECK: No visible masses or lesions noted.  Trachea midline.  No palpable lymphadenopathy.  No thyromegaly.  Carotids +2 bilaterally without bruits. Full range of motion.  Nontender to palpation along the cervical spine.  CARDIOVASCULAR: Heart rate and rhythm are regular.  S1 and S2 present without rubs, clicks, or murmurs noted.  RESPIRATORY: Respirations are even and unlabored.  Breath sounds are  clear to auscultation bilaterally without rales or wheezes noted.  ABDOMEN: Rounded abdominal contour.  Bowel sounds present x 4 quadrants.  Soft, nontender to palpation, without hepatosplenomegaly or CVA tenderness. Nontender to palpation along the entire length of the vertebral column. Femoral pulses are +2 bilaterally.  MUSCULOSKELETAL: No obvious deformities of the bilateral upper extremities, with full range of motion of these extremities without pain.  He does have some crepitus with range of motion at both shoulders.  Radial pulses are +2 bilaterally.  He has full range of motion of his ankles and toes bilaterally, with DP and PT pulses +2.  His right hip has a well-healed incision line and  he is nontender to palpation in the right groin.  He has full flexion and extension, and painless internal and external rotation of the right hip; this is full.  He has full range of motion of the left hip, with full extension. Pretty much equal and nontender internal and external rotation of the left hip.  No tenderness to palpation about the left groin.  The right knee has a well-healed midline incision without redness or ecchymosis noted.  He has full extension and is able to flex the right knee to about 120 degrees.  He is nontender to palpation along the joint line and has minimal crepitus with range of motion of the right knee.  The left knee has no obvious deformity of the skin.  He has minimal tenderness to palpation along the joint line and a moderate amount of crepitus with range of motion of the left knee.  There is no effusion at this time.  He has full extension and about 130 degrees of flexion of the left knee.  There is no instability of the collateral ligaments at this time.  NEUROLOGIC: The patient is alert and oriented x 3.  Cranial nerves 2-12 are grossly intact.  Strength is 5/5 in the bilateral upper and lower extremities, with normal muscle tone and bulk.  Sensation is  intact to light touch.  Deep tendon reflexes are 2+ in bilateral upper and lower extremities.  Rapid alternating movements intact.  RADIOLOGIC FINDINGS: X-rays taken of the left knee in December 2001 showed bone-on-bone contact in the medial compartment of the left knee, with large osteophytes noted even laterally.  It also appeared he had about five degrees of a varus deformity at that time.  IMPRESSION:  1. End-stage osteoarthritis of left knee, with history of right total knee     and right total hip replacement.  2. Hypertension.  3. Hypercholesterolemia.  4. Elevated prostatic specific antigen.  PLAN: Mr. Minor will be admitted to [facility] on September 22, 2000 and will undergo a left total knee arthroplasty by Dr. [name]. Whitfield.  He will undergo all the routine preoperative laboratory tests and studies prior to this procedure.  If he has any medical problems while he is hospitalized we will consult Dr. Corine Shelter office. DD:  09/20/00 TD:  09/21/00 Job: [id number]

## 2011-01-01 NOTE — Discharge Summary (Signed)
NAME:  [name], [name] NO.:  [id number]   MEDICAL RECORD NO.:  [id number]                   PATIENT TYPE:  INP   LOCATION:  2013                                 FACILITY:  [facility]   PHYSICIAN:  Quita Skye. Hart Rochester, M.D.               DATE OF BIRTH:  07/06/1927   DATE OF ADMISSION:  04/26/2003  DATE OF DISCHARGE:                                 DISCHARGE SUMMARY   ANTICIPATED DATE OF DISCHARGE:  May 02, 2003.   HISTORY OF THE PRESENT ILLNESS:  This is a 77 year old male who is referred  by Dr. Patsi Sears for evaluation of abdominal aortic aneurysm.  The patient  has a history of prostate cancer and has had an abdominal CT for follow up  at which time he was found to have an abdominal aortic aneurysm in September  2002 measuring 4.6 cm in diameter.  The patient has been asymptomatic.  He  has been followed with routine ultrasound.  Recently the size has increased  to 5.3 x 4.98 cm.  Aortogram revealed a large infrarenal abdominal aortic  aneurysm with three-vessel runoff.  Dr. Hart Rochester has recommended repair and he  is admitted this hospitalization for the procedure.   PAST MEDICAL HISTORY:  1. Prostate cancer status post radiation treatment with seed implants.  2. Hypertension.  3. Hypercholesterolemia.  4. Osteoarthritis.  5. Right torn rotator cuff.   PAST SURGICAL HISTORY:  1. Appendectomy.  2. Bilateral greater saphenous vein ligation and stripping.  3. Bilateral total knee replacement; right in 2000 and left in 2002.  4. Right hip replacement in 2001.  5. Lithotripsy.  6. Removal of a growth from his parotid gland in 1968.   CURRENT MEDICATIONS:  1. Norvasc 2.5 mg daily.  2. Zocor 10 mg daily.  3. Flomax 0.5 mg every other day.  4. Vitamin E 400 IU daily.  5. Vitamin C 500 mg daily.  6. Tylenol p.r.n.   ALLERGIES:  PENICILLIN causes a rash.   FAMILY HISTORY:  Please see the history and physical done at the time of  admission.   SOCIAL HISTORY:  Please see the history and physical done at the time of  admission.   REVIEW OF SYSTEMS:  Please see the history and physical done at the time of  admission.   PHYSICAL EXAMINATION:  Please see the history and physical done at the time  of admission.   HOSPITAL COURSE:  The patient was admitted electively and on April 26, 2003 was taken to the operating room where he underwent the following  procedures:  1. Resection and grafting of an infrarenal abdominal aortic aneurysm with     insertion of an aorto-bi common iliac graft using an 18 x 9 mm Hemashield     Dacron graft.  2. Reimplantation of accessory lower right renal artery into the aortic     graft.   The patient tolerated  the procedure well and was taken to the surgical  intensive care unit in stable condition.   Postoperative Hospital Course:  The patient initially had some postoperative  confusion in the intensive care unit, but this stabilized over time with the  limiting of analgesics.  He was extubated postoperatively without  difficulty.  Laboratory values have been monitored and he does have a very  mild anemia with most recent hemoglobin and hematocrit 10.8 and 30.9  respectively.  Electrolytes, BUN and creatinine have been stable.  He has  been slowly advanced with regards to diet.  His activity level has been  slowly advanced and he is responding well.  His incisions are healing well  without signs of infection.  His vascular exam is stable.  He is overall  felt to be tentatively stable for discharge in the morning of May 02, 2003 pending morning rounds reevaluation.   DISCHARGE MEDICATIONS:  Medications on discharge are as preoperatively.  Additionally, for pain Tylox 1-2 every four to six hours as needed.   DISCHARGE INSTRUCTIONS:  The patient will receive written instructions with  regards to medications, activities, diet, wound care, and follow up.   FOLLOW UP:  Follow up will  include Dr. Hart Rochester in two weeks.  Staple removal  at the [facility] next week.   DISCHARGE CONDITION:  Condition on discharge is stable and improving.   DISCHARGE DIAGNOSIS:  Abdominal aortic aneurysm now status post resection  and grafting with aorto-bi-iliac bypass.   OTHER DIAGNOSES:  1. Prostate cancer.  2. Hypertension.  3. Hypercholesterolemia.  4.     Osteoarthritis.  5. History of right torn rotator cuff.  6. Previous surgeries as mentioned.        Rowe Clack, P.A.-C.                    Quita Skye Hart Rochester, M.D.    Sherryll Burger  D:  05/01/2003  T:  05/01/2003  Job:  [id number]   cc:   Vale Haven. Andrey Campanile, M.D.  [address]  [place]  [place] [address]  Fax: [phone]

## 2011-01-01 NOTE — Op Note (Signed)
[place]  Patient:    [name]                      MRN: [id number] Proc. Date: 08/20/99 Adm. Date:  [id number] Attending:  Randolm Idol                           Operative Report  PREOPERATIVE DIAGNOSIS:  End-stage osteoarthritis, right hip.  POSTOPERATIVE DIAGNOSIS:  End-stage osteoarthritis, right hip.  PROCEDURE:  Right total hip replacement.  SURGEON:  [name]. [name], M.D.  ASSISTANT:  Jerolyn Shin. Tresa Res, M.D.; Arnoldo Morale, P.A.-C  ANESTHESIA:  General orotracheal.  COMPLICATIONS:  None.  COMPONENTS:  A 58 mm outer diameter Dupuy; 100 series acetabular shell with 10-degree polyethylene inner liner +4 Adept.  A 13.5 standard Prodigy femoral component, with a 28 mm hip ball +12 mm neck.  DESCRIPTION OF PROCEDURE:  With the patient lying comfortably on the operating table and under general orotracheal anesthesia, Foley catheter was inserted. The patient was then placed in lateral decubitus position with the right side up. he patient was secured to the operating table with Innomed hip system.  The right hip was then prepped with Dura-Prep at the iliac crest circumferentially to below the knees.  Sterile draping was performed.  A routine Southern incision was utilized in very sharp dissection carried down through the subcutaneous tissue; gross bleeders were Bovie coagulated.  The iliotibial bend was identified and incised along the length of the skin incision. Retractors were inserted deeper.  The hip was then internally rotated, the short external rotators were identified and incised from the attachment of the posterior aspect of the acetabulum.  ______ structures were tagged with 0 Tycron suture.  The capsule was identified and incised along the length of the femoral neck and  head.  There was approximately 5-7 cc of clear, yellow joint infusion.  There was also a moderate amount of  synovitis.  Synovectomy was performed.  The head was then dislocated posteriorly and an anastomotomy utilizing the Dupuy calcar angle.  The greater trochanteric notch was identified as ______ ______ ______ was performed to 13 mm to accept a 13.5 mm prosthesis.  Rasping was then performed sequentially to 13.5 mm medium-modified aspect; and then using the 12 and then he 13.5 mm standard femoral components.  The calcar reamer was used to append the appropriate calcar angle.  Acetabular retractors were inserted.  Labrum was resected.  Reaming was performed to 57 mm to accept a 58 mm prosthesis; we had circumferential bleeding.  There ere several large cysts that were curettaged and then filled with cancellous bone from the femoral head.  Large posterior osteophytes and inferior osteophytes were removed.  We trialed several acetabular liners and felt that the 58 was an excellent fit.  So, we tapped this in place with excellent position.  We then used the 10-degree standard polyethylene liner.  The femoral rasp was reinserted and we trialed several neck lengths; starting with 8 and progressing to a 12 and a 15.  We still had some toggling in the system. The patient was probably happened and short of preoperatively, so we had to make up  some length.  Accordingly the femoral neck and head were removed.  We tried the  deep acetabulum, which gave [place] an extra 4 mm in length; secured this to the acetabulum.  We then reinserted the femoral neck and head.  We still felt it was was unstable, and accordingly used the extra 15-degrees of anteversion with a 12 neck; still felt that we had some instability, particularly with full flexion internal rotation.  Accordingly, the femoral components were removed as well as the acetabular liner.  We then repositioned the acetabular shell in slightly more flexion to give [place] more buildup posteriorly.  Again, we had excellent fit, it was nice and  stable.  We again inserted the deepened acetabular liner (i.e., +4), and then reinserted the Prodigy femoral neck and head with the 28 mm ball at 12 mm length.  This was a perfectly stable construct and there was no impingement; did not toggle and we felt that we had reestablished equality in leg length.  All the trial components were removed.  The joint was copiously irrigated with saline and antibiotic solution.  The +4 acetabular liner was then impacted, preceded by the apex eliminator.  The Prodigy stem was then impacted, flushed in the calcar, and we trialed the +12 neck.  We felt that there was no toggling, it ws perfectly stable in flexion and extension.  Accordingly, the +12 neck with the 28 mm hip ball was impacted on the femoral neck. The entire construct was reduced, and again we checked full range of motion, flexion and extension, internal and external rotation -- felt there was excellent stability.  The wound was then irrigated with saline and antibiotic solution.  The capsule as closed anatomically with 0 Tycron.  The short external rotators were reestablished with the same material.  The iliotibial bend was closed with a running 0 Vicryl.  The subcutaneous tissue was closed in two layers with 0 and 2.0 Vicryl.  The skin was closed with skin clips.  A sterile, bulky dressing was applied.  The patient was then placed in he supine position.  Leg lengths appeared to be symmetrical.  The patient tolerated the procedure well without complications.DD:  08/20/99 TD:  08/20/99 Job: [id number]

## 2011-01-01 NOTE — Op Note (Signed)
[facility]  Patient:    [name], [name] Visit Number: [id number] MRN: [id number]          Service Type: NES Location: NESC Attending Physician:  Laqueta Jean Dictated by:   Vonzell Schlatter Patsi Sears, M.D. Proc. Date: 07/28/01 Admit Date:  07/28/2001   CC:         Billie Lade, M.D.   Operative Report  PREOPERATIVE DIAGNOSIS:  Adenocarcinoma of the prostate.  POSTOPERATIVE DIAGNOSIS:  Adenocarcinoma of the prostate plus incidental large bladder stone.  OPERATION:  Iodine seed implantation and cystoscopy and basket extraction of large bladder stone.  SURGEON:  Sigmund I. Patsi Sears, M.D.  ANESTHESIA:  General (LMA).  PREPARATION:  After appropriate preanesthesia, the patient was brought to the operating room and placed on the operating table in the dorsal supine position where general LMA was introduced.  He was then replaced in the dorsolithotomy position where the pubis was prepped with Betadine solution and draped in the usual fashion.  DESCRIPTION OF PROCEDURE:  The patient underwent implantation of I-125 seeds. The patient had 62 seeds implanted, but cystoscopy at the end of the case revealed a strand of four seeds within the bladder and urethra, and this was removed.  Cystoscopy also revealed a rather large bladder stone, measuring approximately 2 cm.  This was basket extracted, with difficulty, because the stent could not be brought out through the urethra well.  It was eventually manipulated through the urethra and following this extraction, a Foley catheter was placed.  The patient was awakened and taken to the recovery room after B&O suppository given and also IV Toradol given.  He was in stable condition. Dictated by:   Vonzell Schlatter Patsi Sears, M.D. Attending Physician:  Laqueta Jean DD:  07/28/01 TD:  07/28/01 Job: [id number]

## 2011-01-01 NOTE — H&P (Signed)
NAME:  [name], [name] NO.:  [id number]   MEDICAL RECORD NO.:  [id number]                   PATIENT TYPE:  INP   LOCATION:                                       FACILITY:  [facility]   PHYSICIAN:  Quita Skye. Hart Rochester, M.D.               DATE OF BIRTH:  [birth year]   DATE OF ADMISSION:  04/26/2003  DATE OF DISCHARGE:                                HISTORY & PHYSICAL   CHIEF COMPLAINT:  Abdominal aortic aneurysm.   HISTORY OF PRESENT ILLNESS:  The patient is a 77 year old white male who was  referred by Lynelle Smoke I. Patsi Sears, M.D. for evaluation of an abdominal  aortic aneurysm. The patient has a history of prostate cancer and is status  post radiation therapy as well as radioactive seed implants.  In 2002,  during a routine follow-up and screening CT of the abdomen and pelvis, he  was found to have a 4.6 cm diameter abdominal aortic aneurysm.  He was  referred to Dr. Hart Rochester at that time and has been followed with routine  serial ultrasounds.  Most recently, his ultrasound showed an aneurysm  measuring 5.3 x 4.98 cm which is an increase in size since his last  examination six months ago. He denies any abdominal pain, back pain,  hematochezia, melena, nausea, vomiting, diarrhea, or constipation.  Dr.  Hart Rochester recommended proceeding with an aortogram and this was performed on  April 18, 2003.  He was found to have a large infrarenal abdominal aortic  aneurysm with normal three vessel runoff.  It was recommended that in light  of the increase in the size of the aneurysm, that he proceed at this time  with surgical repair.   PAST MEDICAL HISTORY:  1. Prostate cancer, status post radiation therapy as well as radioactive     seed implants.  2. Hypertension.  3. Hypercholesterolemia.  4. Osteoarthritis.  5. Torn right rotator cuff, awaiting recovery from his aneurysm surgery so     he can proceed with repair.   PAST SURGICAL HISTORY:  1. Appendectomy in  1940.  2. Bilateral greater saphenous vein ligation and stripping of the right in     1975, the left in 1988.  3. Right total knee replacement in 2000.  4. Left total knee replacement in 2002.  5. Right hip replacement in 2001.  6. Lithotripsy.  7. Removal of a growth from his parotid gland in 1968.   CURRENT MEDICATIONS:  1. Norvasc 2.5 mg daily.  2. Zocor 10 mg daily.  3. Flomax 0.4 mg every other day.  4. Vitamin E 400 INTERNATIONAL UNITS daily.  5. Vitamin C daily.  6. Tylenol p.r.n.   ALLERGIES:  PENICILLIN which causes a rash.   FAMILY HISTORY:  His mother died at age 72 of congestive heart failure.  His  father died at age 72 with complications following a hip fracture. His  brother died  at age 47 of type 1 juvenile onset diabetes mellitus.   SOCIAL HISTORY:  He is married and has two children.  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  He smokes  one to two packs of cigarettes per day x30 years. He quit in 1984.  He  reports consuming three or four mixed alcoholic beverages per week.   REVIEW OF SYSTEMS:  See history of present illness for pertinent positives  and negatives. Also he denies fevers, chills, weight loss, visual changes,  amaurosis fugax, TIA symptoms, dysphagia, chest pain, palpitations,  shortness of breath, dyspnea on exertion, orthopnea, paroxysmal nocturnal  dyspnea, reflux symptoms, hematuria, dysuria, lower extremity edema,  claudication symptoms, depression, anxiety, intolerance to heat or cold.  He  does have a history of vertigo and has flare-ups periodically. He does have  some nocturia. He has joint pain from his osteoarthritis, especially in his  feet.   PHYSICAL EXAMINATION:  VITAL SIGNS: Blood pressure 138/84, pulse 84 and  regular, respirations 16 and unlabored.  GENERAL:  This is an obese white male in no acute distress.  HEENT:  Normocephalic and atraumatic.  Pupils equal, round, and reactive  to  light and accommodation.  Extraocular movements intact.  TM's and canals  clear.  Nares patent bilaterally.  Oropharynx is clear with upper and lower  partials in place.  NECK:  Supple without lymphadenopathy, thyromegaly, or carotid bruits.  HEART:  Regular rate and rhythm without murmurs, rubs, or gallops.  LUNGS:  Clear to auscultation.  ABDOMEN:  Soft, obese, nontender, and nondistended with active bowel sounds  in all quadrants. There is a pulsatile midline mass.  EXTREMITIES:  He has well-healed bilateral longitudinal knee scars from  previous knee replacements.  No cyanosis, clubbing, or edema.  He has 2+  femoral, dorsalis pedis, and posterior tibial pulses bilaterally.  NEUROLOGY:  Cranial nerves II-XII grossly intact.  Gait is within normal  limits. Motor and sensory are intact. He is alert and oriented x3.   ASSESSMENT:  This is a 77 year old white male who presents with an  asymptomatic, but enlarging, abdominal aortic aneurysm.  He will be admitted  to [facility] on April 26, 2003, and will undergo abdominal  aortic aneurysm repair by Quita Skye. Hart Rochester, M.D.      Coral Ceo, P.A.                        Quita Skye Hart Rochester, M.D.    GC/MEDQ  D:  04/24/2003  T:  04/24/2003  Job:  [id number]   cc:   Vale Haven. Andrey Campanile, M.D.  [address]  [place]  [place] [address]  Fax: [phone]   [name]. [name], M.D.  [address] [place]  [place] [address]  Fax: [phone]   Sigmund I. Patsi Sears, M.D.  [address]  [place]  [place] [address]  Fax: [phone]

## 2011-01-01 NOTE — Discharge Summary (Signed)
[place]. [facility]  Patient:    [name]                      MRN: [id number] Adm. Date:  [id number] Disc. Date: [id number] Attending:  Randolm Idol Dictator:   Jamelle Rushing, P.A.                           Discharge Summary  ADMISSION DIAGNOSES: 1. Osteoarthritis of right hip and left knee. 2. Hypercholesterolemia.  DISCHARGE DIAGNOSES: 1. Status post right total hip hemiarthroplasty. 2. Osteoarthritis of left knee. 3. Urinary retention. 4. Hypercholesterolemia.  HISTORY OF PRESENT ILLNESS:  This is a 77 year old white male who has been having right hip pain for approximately the last year. The right hip pain has significantly worsened after having his right total hip arthroplasty performed n August of 2000. The patient states he does have popping and grinding in the right hip. He describes the pain as an aching sensation deep into the groin with some  burning over the lateral aspect of the hip. The patient does have increased difficulty in ambulating with longer periods of time on his feet. The patient does have difficulty getting in and out of the car. The patient does have difficulty  going to sleep and is awakened frequently due to his hip pain. The patient denies any paresthesia in his lower extremities. The patient denies any injury to the ip. The patient states Celebrex does minimal amount of improvement.  DRUG ALLERGIES:  PENICILLIN causing a rash. MORPHINE causing extreme jitteriness.  CURRENT MEDICATIONS: 1. Zocor 10 mg p.o. q.d. 2. Celebrex 200 mg p.o. b.i.d. 3. Glucosamine 3000 mg p.o. q.d.  OPERATIVE PROCEDURES:  On January 4, the patient was taken to the OR by [name]. [name], M.D., assisted by Arnoldo Morale, P.A.C. and Jerolyn Shin. Tresa Res, M.D. Under general anesthesia, patient had a right total hip replacement performed without any complications. The patient tolerated the surgical procedure well and  was transferred to the recovery room in good condition.  COMPLICATIONS:  None.  CONSULTATIONS:  On January 4, the following routine consults were requested: Physical therapy, occupational therapy, rehab, care management, pharmacy for Coumadin protocol.  HOSPITAL COURSE:  On August 20, 1999, the patient was admitted to [facility] under the care of [name]. [name], M.D. The patient was taken to the OR where a right total hip arthroplasty was performed. There were no complications, and patient was transferred to the recovery room and then to the orthopedic floor in good condition.  Postop day #1 and 2, the patient progressed very nicely with just complaint of ain in his right hip being worse than when he had his right knee replaced. The patient denied any shortness of breath or chest pain, but the PCA pump did seem to be improving his discomfort. The patient was also using some oral Percocet p.r.n. he patients vital signs were stable. The patients tmax was 102.5. H&H was 11.5 and  33.3. The patients right hip dressing was intact, and distal leg was neuromotorvascularly intact. The patient was to start physical therapy at partial weightbearing. The patient did have his PCA discontinued on postop day #2 and was placed on OxyContin CR.  On postop day #3, the patient complained of unable to urinate. The patient did ave some increased temperature. It was 101.6 and had no other complaints of shortness of breath, chest pain. His H&H  remained stable. His wound had a moderate amount of serosanguineous discharge but no sign of erythema or infection in his hip. The patient was changed from Cleocin over to Levaquin, and a urinalysis was sent to  rule out any infection. The patient was placed on Urecholine 10 mg p.o. b.i.d. Postop day #4, the patient was complaining of feeling like he had no energy but  denied any shortness of breath, chest pain, nausea, fevers, or chills.  He was improving slightly on his urinary retention but still was having a little bit of problems. The patients temperature max was 100.5 and otherwise vital signs stable. Being that the patients symptoms did seem to be improving, we were going to continue him on the current treatment plan and continue with the physical therapy.  Postop day #5, the patient had absolutely no complaints. He was feeling quite a bit a better. He had very little leg or hip pain. Vital signs were stable. He was afebrile. His right hip dressing had a moderate amount of serosanguineous discharge but no erythema. No Homans signs and distal leg was neuromotorvascularly intact. The patient had been able to void without any difficulty, and his temperature had been remaining normal for at least the last 24 hours. The patient was going to continue on current plans for one more day and plan for discharge on postop day #6.  Postop day #6, the patient had absolutely no complaints. He was feeling a lot better. He had voided, had had a bowel movement. Vital signs were stable. His PT was 19.2, with a 2.2 INR. The patients wound was benign. His distal leg was neuromotorvascularly, and he was progressing very well with physical therapy so he was discharged to home on this date. The patient did have home health physical therapy and home health nurse to check wound and remove staples in one week and to draw PT blood times.  DISCHARGE INSTRUCTIONS: 1. Coumadin dosing per pharmacy. 2. Levaquin 500 mg p.o. q.d. x 4 more days. 3. OxyContin CR 10 mg take one tablet every 12 hours. 4. OxyIR 5 mg one or two tablets every four hours p.r.n. pain. 5. Colace 100 mg p.o. b.i.d.  WOUND CARE:  Keep wound clean and dry.  INSTRUCTIONS:  Home health nurse to draw prothrombin times on January 15, 22, and 29, and February 5. The patient is to receive home health physical therapy three times a week.  FOLLOW-UP:  The patient is to  call [phone] for a follow-up appointment with [name]. Whitfield, M.D. in two to three weeks.  LABORATORY DATA:  On admission, the patients CBC was within normal limits. January  5, WBCs were 11.7, hemoglobin of 11.5, hematocrit of 33.3, with 213,000 platelets. On January 7, WBCs were 12, hemoglobin 11.3, hematocrit of 32.5, with 255,000 platelets. PT on January 10 was 19.7, with 2.2 INR. Routine chemistries on admission were totally normal. Urinalysis on January 7 shows 21 to 50 RBCs and bacteria many. All other values normal. Urine culture was negative after one day.  DISCHARGE MEDICATIONS: 1. Coumadin per pharmacy dosing. 2. Urecholine 12.5 mg p.o. b.i.d. 3. Levaquin 500 mg p.o. q.d. 4. OxyContin CR 10 mg p.o. q.12h. 5. Zocor 10 mg p.o. q.d. 6. OxyIR 5 mg one to two tablets p.o. q.3-4h. p.r.n. pain.  CONDITION ON DISCHARGE:  Improved and good.  DISPOSITION:  Discharged to home. DD:  09/15/99 TD:  09/15/99 Job: [id number]

## 2011-01-01 NOTE — Discharge Summary (Signed)
[name]. [facility]  Patient:    [name], [place]                     MRN: [id number] Adm. Date:  [id number] Disc. Date: [id number] Attending:  Randolm Idol Dictator:   Arnoldo Morale, P.A. CC:         Vale Haven. Andrey Campanile, M.D.   Discharge Summary  ADMISSION DIAGNOSES: 1. End-stage osteoarthritis left knee, with history of right total knee, right    total hip replacements. 2. Hypertension. 3. Hypercholesterolemia. 4. Elevated prostate-specific antigen.  DISCHARGE DIAGNOSES: 1. End-stage osteoarthritis left knee, with history of right total knee, right    total hip replacements. 2. Hypertension. 3. Hypercholesterolemia. 4. Elevated prostate-specific antigen. 5. Postoperative constipation.  PROCEDURES:  On September 22, 2000, Mr. [name] underwent a left total knee arthroplasty by Dr. [name]. [place], assisted by Arnoldo Morale, P.A.C.  COMPLICATIONS:  None.  CONSULTATIONS: 1. Pharmacy consult for Coumadin therapy September 22, 2000. 2. Anesthesia and rehabilitation medicine consults on September 23, 2000, in    addition to PT consult. 3. Occupational therapy consult on September 26, 2000.  HISTORY OF PRESENT ILLNESS:  This 77 year old white male patient presented to Dr. [name] with a history of right total knee and right total hip in January 2001.  He has now had left knee pain for the last two to three years that has been getting progressively worse.  It is a grinding, dull type of pain present with any weightbearing but otherwise intermittent.  It is ______ across the joint line without radiation, and it increases with weightbearing, and nothing alleviates it.  The knee pops and grinds, and he has to wear a pullover sleeve to give him some stability when he walks, and it feels like the knee might give way.  He has failed conservative treatment at this time and is presenting for a left total knee arthroplasty.  HOSPITAL COURSE:  Mr. [name]  tolerated his surgical procedure well without immediate postoperative complications.  He was subsequently transferred to 5000.  On postoperative day #1 t-max was 100, vital signs were stable.  The pain was well controlled with epidural pain medicine.  Hemoglobin was 12, hematocrit 35.9, and his leg was neurovascularly intact.  He was started on PT per protocol.  On postoperative day #2 he remained afebrile, vital signs stable.  His epidural was discontinued, and he was started on p.o. pain medications.  His incision was well approximated with staples, and his Hemovac was discontinued. Hemoglobin 11.2, hematocrit 32.2.  He continued to make good progress, with a normal postoperative course over the next several days.  He had some difficulty with constipation, and that was treated with EOC and LOC.  On September 27, 2000, he was doing well enough for discharge home.  DISCHARGE INSTRUCTIONS: 1. He is to resume all prehospitalization medications and diet, except for    his Celebrex. 2. Additional medications include:    a. OxyIR 5 mg 1-2 p.o. q.4h. p.r.n. for pain, 45, with no refill.    b. Skelaxin 400 mg 1-2 p.o. q.6h. p.r.n. for spasm, 40, with no refill.    c. Coumadin 1 tablet p.o. q.d., with the dose to be adjusted per [facility]       Pharmacy.    d. He is to also use Senokot-S p.r.n. constipation. 3. He is to be partial weightbearing, 50% or less, on the left leg with use    of the walker.  He  may shower after no drainage from the knee for two days. 4. He is arranged for home health physical therapy, R.N., and pharmacy for    Coumadin monitoring per [facility]. 5. He is to notify Dr. [name] if temperature greater than 101.5, chills,    pain unrelieved by pain medications, or foul-smelling drainage from the    wound. 6. He is to follow up with Dr. [name] in our office in approximately 7-10    days, and he needs to call [phone] to set up that appointment.  He  stated good understanding of these instructions and was subsequently discharged home.  LABORATORY DATA:  On September 23, 2000, hemoglobin 12, hematocrit 35.9.  On September 24, 2000, hemoglobin 11.2, hematocrit 32.2.  On September 25, 2000, hemoglobin 10, hematocrit 28.7, and on September 26, 2000, hemoglobin 10.5, hematocrit 30.5.  On September 24, 2000, PT 15.5, INR 1.4.  On September 27, 2000, PT 17.7, INR 1.8.  On September 23, 2000, sodium 134, glucose 117, calcium 8.2.  On September 24, 2000, sodium 134, glucose 133, calcium 8.3.  On September 25, 2000, sodium 132, potassium 3.9, chloride 99, CO2 26, glucose 122, BUN 6, creatinine 0.8, and calcium 8.1.  All other laboratory studies were within normal limits. DD:  10/14/00 TD:  10/16/00 Job: [id number]

## 2011-01-01 NOTE — Op Note (Signed)
NAME:  [name], [name] NO.:  [id number]   MEDICAL RECORD NO.:  [id number]                   PATIENT TYPE:  OIB   LOCATION:  2899                                 FACILITY:  [facility]   PHYSICIAN:  Quita Skye. Hart Rochester, M.D.               DATE OF BIRTH:  1926/11/11   DATE OF PROCEDURE:  04/18/2003  DATE OF DISCHARGE:  04/18/2003                                 OPERATIVE REPORT   PREOPERATIVE DIAGNOSIS:  Infrarenal abdominal aortic aneurysm.   SURGEON:  Quita Skye. Hart Rochester, M.D.   PROCEDURE:  Biplane abdominal aortogram with bilateral lower extremity  runoff via right common femoral approach.   SURGEON:  Quita Skye. Hart Rochester, M.D.   ANESTHESIA:  Local Xylocaine.   CONTRAST:  175 mL.   COMPLICATIONS:  None.   DESCRIPTION OF PROCEDURE:  The patient was taken to the [facility]  Peripheral Endovascular Lab and placed in the supine position, at which time  both groins were prepped with Betadine scrub and solution and draped in a  routine sterile manner.  After infiltration with 1% Xylocaine with  epinephrine, the right common femoral artery was entered percutaneously.  A  guide wire was passed into the suprarenal aorta under fluoroscopic guidance.  A 5 French sheath and dilator were passed over the guide wire.  The dilator  was removed, and a standard graduated pigtail catheter positioned in the  suprarenal aorta.  A flush abdominal aortogram was performed, injecting 30  mL of contrast at 20 mL per second.  This revealed the aorta to be widely  patent with pared renal arteries bilaterally.  These arose at the same  level, and there was no evidence of any significant stenosis.  There was a  saccular infrarenal abdominal aortic aneurysm.  There was a prominent bulge  at the patient's left side, about 1 cm distal to the lowest renal artery and  a long neck below this with the aneurysm extending down to the bifurcation.  In the inferior mesenteric artery was noted to fill  very slowly.  Both  common, internal, and external iliac arteries appeared normal and widely  patent.  The lateral aortogram was then performed, injecting 30 mL of  contrast at 20 mL per second, and this revealed the superior mesenteric  artery to be widely patent proximally.  There was a prominent posterior  bulge in the infrarenal location as well with some slight anterior deviation  of the neck.  The catheter was then withdrawn into the terminal aorta and  bilateral lower extremity runoff performed, injecting 88 mL of contrast at 8  mL per second.  This revealed both external iliacs, common femorals,  profunda femorals, superficial femorals, and popliteal arteries to be large  and widely patent with no abnormalities.  On the right side, the anterior  tibial artery was widely patent.  There appeared to be a localized stenosis  at the level  of the tibioperoneal trunk on the right side at its origin.  The posterior tibial and peroneal arteries, however, were widely patent on  the right with good vessels distally.  On the left, there appeared to be  normal three-vessel runoff.  Having tolerated the procedure well, the  pigtail catheter was removed over the guide wire, the sheath removed, and  adequate compression applied.  No complications ensued.    FINDINGS:  1. Large infrarenal saccular abdominal aortic aneurysm.  2. Pared bilateral renal arteries.  3. Normal iliac arteries and distal runoff.                                               Quita Skye Hart Rochester, M.D.    JDL/MEDQ  D:  04/18/2003  T:  04/19/2003  Job:  [id number]

## 2011-01-01 NOTE — Discharge Summary (Signed)
[place]  Patient:    [name], [name]                     MRN: [id number] Adm. Date:  [id number] Disc. Date: [id number] Attending:  Randolm Idol Dictator:   Arnoldo Morale, P.A. CC:         Vale Haven. Andrey Campanile, M.D.   Discharge Summary  ADMITTING DIAGNOSES: 1. End-stage osteoarthritis left knee with history of right total knee and    right total hip replacement. 2. Hypertension. 3. Hypercholesterolemia. 4. Elevated prostatic specific antigen.  DISCHARGE DIAGNOSES: 1. End-stage osteoarthritis left knee with history of right total knee and    right total hip replacement. 2. Hypertension. 3. Hypercholesterolemia. 4. Elevated prostatic specific antigen. 5. Constipation.  SURGICAL PROCEDURE:  September 22, 2000 [name] underwent a left total knee arthroplasty by Dr. [name]. [place], assisted by Arnoldo Morale, P.A.C.  COMPLICATIONS:  None.  CONSULTATIONS: 1. Pharmacy consult for Coumadin therapy. Anesthesia consult for postop    epidural. 2. Rehab medicine and physical therapy consult September 23, 2000. 3. Occupational therapy consult September 26, 2000.  HISTORY OF PRESENT ILLNESS:  This 77 year old white male patient of Dr. Serita Grit presented with a two to three year history of left knee pain. He has a history of a right total hip and a right total knee in the last two years. The pain has been gradual in onset and getting progressively worse. It is a grinding dull type of pain present with any weight bearing on the leg, but otherwise intermittent. The pain is located across the joint line without radiation and increases with any weight bearing and nothing seems to alleviate the pain. The knee pops and grinds and he wears pull over sleeve to help support it, because it feels like it might give way at times. He has had no relief from p.o. anti-inflammatories, or even intra-articular cortisone shots. Because of this he is  presenting for a left total knee arthroplasty.  HOSPITAL COURSE:  [name] tolerated his surgical procedure well without immediate postoperative complications. A postop epidural was placed and he was subsequently transferred to 5000. Postop day one temperature maximum was 100, vital signs were stable. Hemoglobin was 12, hematocrit 35.9. He was started on PT per protocol. His postoperative course, from this time on, was completely within normal limits. He remained basically afebrile with a temperature maximum of 100.6. He was successfully switched from epidural to p.o. pain medications. He was continued on therapy and made very good progress. He was ready for discharge home on February 12. He did have some difficulty with constipation that was relieved with a laxative.  On February 12 temperature maximum was 100.1, vital signs stable, left knee incision approximated with staples without drainage. PT 17.7 and INR 1.8.  DISCHARGE INSTRUCTIONS: 1. He is to resume all pre-hospitalization medications and diet, except for    Celebrex. 2. Additional medications include, Oxy IR 5 mg one to two p.o. q.4h. p.r.n.    for pain 45 with no refill, Skelaxin 400 mg one to two tablets p.o. q.6h.    p.r.n. spasms 40 with no refill, Coumadin with the dose to be determined by    pharmacy one tablet p.o. q.d. 3. He is instructed he may use Senokot S as directed on the box, p.r.n. for    constipation. 4. Partial weight bearing 50% or less on the left leg with use of a walker. 5. He may shower after  he has had no drainage from his knee incision for two    days. 6. He is arranged for home health physical therapy and R.N. and pharmacy for    Coumadin monitoring per [facility] 7. He is to notify Dr. [name] of a temperature greater than 101.5, chills,    pain not relieved by pain medications or foul smelling drainage from the    wound.  FOLLOWUP:  He is to followup with Dr. [name] in  our office in about 7 to 10 days and he needs to call [phone] to set up that appointment.  LABORATORY DATA:  On September 23, 2000 hemoglobin was 12, hematocrit 35.9. On February 9 hemoglobin was 11.2, hematocrit 32.2. On February 10 hemoglobin was 10, hematocrit 28.6. On February 11 hemoglobin was 10.5, hematocrit 30.5. On September 19, 2000 PT was 12.6, INR 1 and PTT 24. On September 27, 2000 PT was 17.8, INR 1.8. On February 8 sodium was 134, potassium 4, glucose 117, calcium 8.2. On February 9 sodium was 134, potassium 2.5, glucose 133, calcium 8.3. On February 10 the sodium was 132, potassium 3.9, glucose 122 and calcium 8.1. All other laboratory studies were within normal limits.  Chest x-ray on September 19, 2000 showed a tortuous aorta and thoracic spondylosis. No other disease. DD:  09/27/00 TD:  09/27/00 Job: [id number]

## 2011-01-01 NOTE — Discharge Summary (Signed)
NAME:  [name], [name]                        ACCOUNT NO.:  [id number]   MEDICAL RECORD NO.:  [id number]                   PATIENT TYPE:  INP   LOCATION:  2025                                 FACILITY:  [facility]   PHYSICIAN:  Quita Skye. Hart Rochester, M.D.               DATE OF BIRTH:  April 13, 1927   DATE OF ADMISSION:  05/07/2003  DATE OF DISCHARGE:  05/22/2003                                 DISCHARGE SUMMARY   ADMISSION DIAGNOSES:  Ileus status post abdominal aortic aneurysm repair.   PAST MEDICAL HISTORY:  1. Prostate cancer status post radiation therapy as well as radioactive seed     implants.  2. Hypertension.  3. Hypercholesterolemia.  4. Osteoarthritis.  5. Torn rotator cuff.  6. Abdominal aortic aneurysm status post AAA repair on April 26, 2003.   PAST SURGICAL HISTORY:  1. Appendectomy.  2. Bilateral greater saphenous vein ligation and stripping of right in 1975     and left in 1988.  3. Right total knee replacement in 2000.  4. Left total knee replacement in 2002.  5. Right hip replacement in 2001.  6. Lithotripsy.  7. Removal of growth from his parotid gland in 1968.   ALLERGIES:  PENICILLIN which causes a rash.   DISCHARGE DIAGNOSES:  Ileus status post abdominal aortic aneurysm repair.   BRIEF HISTORY:  This is a 77 year old white male status post abdominal  aortic aneurysm repair by Dr. Hart Rochester on April 26, 2003. The patient was  discharged home in stable condition on May 02, 2003. The patient  presented on May 07, 2003 feeling poorly and complaining of weakness,  nausea, and vomiting. The patient had had a bowel movement that morning  after a suppository. The patient was evaluated by Dr. Arbie Cookey. On physical  exam, the patient was in no acute distress. The abdomen had hyperactive  bowel sounds, but the incision was stable. Femoral and popliteal pulses were  2+ bilaterally. KUB x-ray revealed an ileus versus early small-bowel  obstruction. It was Dr.  Bosie Helper opinion that the patient should be admitted  for IV hydration and kept NPO for resolution of an ileus. On hospital day  #3, the abdomen was distended and mildly tender. There were few bowel sounds  present. A NG tube was inserted, and two liters of gastric content were  removed. The patient was started on TNA as he had been kept NPO since  admission. On hospital day #7, the patient dislodged his NG tube. As the NG  drainage had continued to decrease, it was not reinserted. The patient's  diet was advanced to clear liquids on hospital day #8. An abdominal x-ray  taken the following day showed multiple loops of small bowel with air fluid  levels. The patient had not experienced any nausea or vomiting; however, the  patient was kept NPO at that time. The patient's diet tolerance continued to  improve daily. All lab values  remained within normal limits and stable  throughout the hospital stay. On the day prior to discharge, the patient was  afebrile, and vital signs were stable. He was feeling very well. He was  tolerating a liquid diet with no nausea. He was having flatus but no bowel  movement. Lungs were clear, and the patient was ambulating well in the  hallway. The ileus has slowly resolved. He was tolerating a soft diet at  this time. As long as the patient continues to progress and has no further  complications, we anticipate him to be stable for discharge on May 22, 2003.   LABORATORY DATA:  CBC on May 20, 2003:  White count 4.4, hemoglobin 10.8,  hematocrit 32.2, platelets 233. BNP on May 21, 2003:  Sodium 138,  potassium 4.0, BUN 11, creatinine 0.7, glucose 149. Prealbumin on May 21, 2003 is 15.7, magnesium 1.8, and phosphorous 3.9.   CONDITION ON DISCHARGE:  Improved.   DISCHARGE MEDICATIONS:  The patient is to resume his home medications of  Norvasc 2.5 mg q.d., Zocor 10 mg q.d.  Flomax 0.4 mg every other day, vitamin E 400 mg q.d. Vitamin C 500 mg q.d.    PAIN MANAGEMENT:  Tylenol 500 mg one to two p.o. q.4-6h. p.r.n. pain.   ACTIVITY:  No heavy lifting, pulling or pushing. No strenuous activity, and  the patient is to continue daily breathing and walking exercises.   DIET:  Resume home diet.   WOUND CARE:  Shower daily and clean the incision with soap and water.   SPECIAL INSTRUCTIONS:  The patient is to call the CVTS office with any  problems or questions.   FOLLOW UP:  Follow up with Dr. Hart Rochester on June 04, 2003 at 10:30 a.m.      Pecola Leisure, PA                      Quita Skye. Hart Rochester, M.D.    AY/MEDQ  D:  05/21/2003  T:  05/22/2003  Job:  [id number]   cc:   Vale Haven. Andrey Campanile, M.D.  [address]  [place]  [place] [address]  Fax: [phone]

## 2011-01-01 NOTE — Op Note (Signed)
[place]  Patient:    TODDY, BOYD                     MRN: [id number] Proc. Date: 09/22/00 Adm. Date:  [id number] Attending:  Randolm Idol                           Operative Report  PREOPERATIVE DIAGNOSIS:  Osteoarthritis, left knee.  POSTOPERATIVE DIAGNOSIS:  Osteoarthritis, left knee.  OPERATION PERFORMED:  Left total knee replacement.  SURGEON:  [name]. [name], M.D.  ASSISTANT:  Arnoldo Morale, P.A.  ANESTHESIA:  General orotracheal.  COMPLICATIONS:  None.  COMPONENTS:  Depuy LCS complete large cemented femoral component, large plus or #5 rotating tibial platform with a 12.5 mm bridging bearing and a cruciate design metal backed cemented patella.  DESCRIPTION OF PROCEDURE:  With the patient comfortable on the operating table and under general orotracheal anesthesia, a Foley catheter was inserted by the nursing staff.  A left lower extremity tourniquet was then applied along the proximal thigh.  The leg was then prepped with Betadine scrub and then DuraPrep and from the tourniquet to the midfoot.  Sterile draping was performed.  With the extremity still elevated, it was Esmarch exsanguinated with the proximal tourniquet at 350 mmHg.  A midline longitudinal incision was made centered over the patella by sharp dissection and carried down to subcutaneous tissue.  The first layer of capsule was incised.  There was a small prepatellar bursa that was excised. A median parapatellar incision was made for the deep capsule.  There was a clear yellow effusion.  The patella was everted 180 degrees, the knee flexed to 90 degrees.  There was complete absence of articular cartilage along the medial compartment involving both medial femoral and medial tibial plateau. There was a fixed varus position.  The medial release was performed which allowed the knee to be placed in neutral position.  Preoperatively, we had measured a large  femoral component and either a large + or a large ++, i.e. #4 or #5 tibial tray.  The #5 was confirmed intraoperatively.  The appropriate jigs were then applied to the femur and the tibia to make the appropriate femoral and tibial cuts.  MCL and LCL remained intact throughout the procedure.  PCL and ACL were sacrificed.  10 mm flexion extension gap was symmetrical.  4 degree distal femoral valgus cut was made.  Osteophytes were removed from the posterior femur as well as along the medial tibial plateau.  Lamina spreaders were inserted to remove medial and lateral menisci and remnants of ACL and PCL.  The tibial jig was then applied to obtain the center hole.  The trial components were then inserted including the #5 LCS complete rotating tibial platform.  We trialed the 10 mm bridging bearing and then followed by the femoral component.  We thought that we had a little bit of opening with varus and valgus stress.  Accordingly, the 12.5 mm bridging bearing was inserted at which point, we had perfectly symmetrical varus and valgus position.  The patella was then prepared by removing 11 mm of bone leaving 16 mm of patella thickness.  The cruciate backed jig was then applied to obtain the appropriate patellar cut.  The patellar component was applied without methacrylate with an excellent fit and tracked midline without need for lateral release.  The trial components were removed.  The joint was then copiously  irrigated with jet saline and then antibiotic solution.  Each of the components was then carefully applied with polymethyl methacrylate.  A patella clamp was applied to the patella.  Extraneous methacrylate was removed.  After complete maturation of the methacrylate, the joint was inspected.  There were a few areas of extraneous methacrylate that were removed with the osteotome.  The joint was again irrigated with jet saline antibiotic solution.  The tourniquet was deflated.  Gross  bleeders were Bovie coagulated.  Hemovac was inserted. Deep capsule was closed with interrupted #1 Tycron.  Superficial capsule closed with a running 0 Vicryl, the subcutaneous with 2-0 Vicryl and the skin closed with skin clips.  Sterile bulky dressing was applied followed by an Ace bandage and a knee immobilizer.  The patient is to have an epidural catheter postoperatively for pain control.  There were no problems during anesthesia. DD:  09/22/00 TD:  09/23/00 Job: [id number]

## 2011-01-01 NOTE — Discharge Summary (Signed)
NAMELORIK, GUO              ACCOUNT NO.:  [id number]   MEDICAL RECORD NO.:  [id number]          PATIENT TYPE:  INP   LOCATION:  5122                         FACILITY:  [facility]   PHYSICIAN:  Ollen Gross. Vernell Morgans, M.D. DATE OF BIRTH:  [birth year]   DATE OF ADMISSION:  06/25/2008  DATE OF DISCHARGE:  06/30/2008                               DISCHARGE SUMMARY   Mr. [name] is a 77 year old gentleman who had 2 right colon masses.  He  was brought to the operating room on June 25, 2008, and underwent a  right colectomy.  He tolerated the operation well.  He was on the  enteric protocol.  On postop day 1, he was allowed some sips of clears.  On postop day 2, his Foley was discontinued.  He was up, ambulating.  On  postop day 3, he appeared as though he may be having a little bit of an  ileus, and Reglan was added.  On postop day 4, his diet was advanced.  He continued to improve, and on postop day 5, he was tolerating his diet  and ambulating without difficulty.  His pain was under good control, and  he was ready for discharge home, as well as he was given a prescription  for pain medicine, and he was to resume his home meds.   ACTIVITIES:  No heavy lifting.   DIET:  As tolerated.   FINAL DIAGNOSIS:  Two right colon masses.   Follow up with Dr. Carolynne Edouard in a week for staple removal, and he is  discharged home.      Ollen Gross. Vernell Morgans, M.D.  Electronically Signed     PST/MEDQ  D:  08/22/2008  T:  08/22/2008  Job:  [id number]

## 2011-01-21 ENCOUNTER — Other Ambulatory Visit: Payer: Self-pay | Admitting: Urology

## 2011-01-21 ENCOUNTER — Encounter (HOSPITAL_COMMUNITY): Payer: Medicare Other

## 2011-01-21 LAB — BASIC METABOLIC PANEL
CO2: 27 mEq/L (ref 19–32)
Calcium: 9.6 mg/dL (ref 8.4–10.5)
Glucose, Bld: 89 mg/dL (ref 70–99)
Potassium: 4.4 mEq/L (ref 3.5–5.1)
Sodium: 132 mEq/L — ABNORMAL LOW (ref 135–145)

## 2011-01-25 ENCOUNTER — Other Ambulatory Visit: Payer: Self-pay | Admitting: Urology

## 2011-01-25 ENCOUNTER — Observation Stay (HOSPITAL_COMMUNITY)
Admission: RE | Admit: 2011-01-25 | Discharge: 2011-01-26 | Disposition: A | Discharge status: Home / Self Care | Payer: Medicare Other | Source: Ambulatory Visit | Attending: Urology | Admitting: Urology

## 2011-01-25 DIAGNOSIS — C649 Malignant neoplasm of unspecified kidney, except renal pelvis: Principal | ICD-10-CM | POA: Insufficient documentation

## 2011-01-25 DIAGNOSIS — N329 Bladder disorder, unspecified: Secondary | ICD-10-CM | POA: Insufficient documentation

## 2011-01-25 DIAGNOSIS — Z79899 Other long term (current) drug therapy: Secondary | ICD-10-CM | POA: Insufficient documentation

## 2011-01-25 DIAGNOSIS — I10 Essential (primary) hypertension: Secondary | ICD-10-CM | POA: Insufficient documentation

## 2011-01-25 DIAGNOSIS — Z01818 Encounter for other preprocedural examination: Secondary | ICD-10-CM | POA: Insufficient documentation

## 2011-02-04 NOTE — Discharge Summary (Signed)
  [name], [name] NO.:  [id number]  MEDICAL RECORD NO.:  [id number]  LOCATION:  1415                         FACILITY:  [facility]  PHYSICIAN:  [name], MD      DATE OF BIRTH:  September 09, 1926  DATE OF ADMISSION:  01/25/2011 DATE OF DISCHARGE:  01/26/2011                              DISCHARGE SUMMARY   ADMISSION DIAGNOSES: 1. 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 2. Positive urine cytology and hematuria with possible bladder mass.  DISCHARGE DIAGNOSES: 1. 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 2. Positive urine cytology and hematuria with possible bladder mass.  HISTORY AND PHYSICAL:  For full details, please see admission history and physical.  Briefly, Mr. [name] is an 77 year old gentleman with a history of urothelial carcinoma of the left renal pelvis, status post a left nephroureterectomy.  He recently has been found to have a positive urine cytology and intermittent gross hematuria.  He has undergone recent evaluation including cystoscopy, which demonstrated a possible recurrence within the bladder, near the bladder neck, and has been recommended to undergo transurethral resection of this area for further diagnostic and possible therapeutic purposes.  HOSPITAL COURSE:  On January 25, 2011, he was taken to the operating room and underwent cystoscopy and transurethral resection of the lesion within his bladder and prostatic urethra.  He subsequently was transferred to a regular hospital room and placed on continuous bladder irrigation overnight.  His continuous bladder irrigation was able to be quickly weaned off and his catheter was left indwelling overnight and removed the following morning.  He was then given a voiding trial. Although he was only able to void small amounts and a subsequent postvoid residual urine was elevated.  A Foley catheter was therefore replaced with 800 cc of grossly clear urine returned.   It was decided to let him be discharged from the hospital with his indwelling catheter.  DISPOSITION:  Home.  DISCHARGE MEDICATIONS:  He will resume his regular home medications.  He has been provided a prescription for ciprofloxacin for antibiotic prophylaxis for a couple of days.  FOLLOWUP:  He will follow up later this week for a voiding trial in the office.    [name], MD    LB/MEDQ  D:  01/27/2011  T:  01/27/2011  Job:  [id number]  Electronically Signed by [name] MD on 02/04/2011 11:34:18 PM

## 2011-02-04 NOTE — Op Note (Signed)
[name], [name] NO.:  [id number]  MEDICAL RECORD NO.:  [id number]  LOCATION:  DAYL                         FACILITY:  [facility]  PHYSICIAN:  [name], MD      DATE OF BIRTH:  27-Dec-1926  DATE OF PROCEDURE:  01/25/2011 DATE OF DISCHARGE:                              OPERATIVE REPORT   PREOPERATIVE DIAGNOSES: 1. Urothelial carcinoma of the left renal pelvis. 2. Bladder neck lesion.  POSTOPERATIVE DIAGNOSES: 1. Urothelial carcinoma of the left renal pelvis. 2. Bladder neck lesion.  PROCEDURES: 1. Cystoscopy. 2. Transurethral resection of bladder neck lesion.  SURGEON:  [name], MD  ANESTHESIA:  General.  COMPLICATIONS:  None.  ESTIMATED BLOOD LOSS:  Minimal.  INDICATIONS:  [name] is an 77 year old gentleman with a history of high-grade urothelial carcinoma of the left renal pelvis status post a left nephroureterectomy in November 2011.  In the spring of 2012, he had recurrent hematuria and underwent evaluation including cystoscopy with no identifiable lesions.  He did have atypical tumor markers with a suspicious cytology and positive FISH analysis prompting evaluation in the operating room including multiple bladder biopsies and right retrograde pyelography.  His evaluation at that time was unremarkable. He has continued to have some intermittent gross hematuria and on his most recent evaluation in the office he was noted to have possible tumor recurrence at the left bladder neck.  Based on his prior abnormal tumor markers, recurrent hematuria, and abnormal cystoscopic findings, it was recommended that he undergo transurethral resection of this area for further diagnostic and possible therapeutic purposes.  The potential risks, complications, and alternative treatment options were discussed in detail and informed consent obtained.  DESCRIPTION OF PROCEDURE:  The patient was taken to the operating room and a general anesthetic was  administered.  He was given preoperative antibiotics, placed in the dorsal lithotomy position, and prepped and draped in the usual sterile fashion.  Next a preoperative time-out was performed.  Cystourethroscopy was then performed which revealed a normal anterior urethra.  Prostatic urethra was notable for a mildly high bladder neck and bilobar hypertrophy.  Inspection of the bladder revealed the right ureteral orifice to be in its expected anatomic location.  At the left bladder neck from approximately 2 to 12 o'clock, there was an abnormal growth of tissue which was erythematous and possibly consistent with recurrence of his urothelial carcinoma.  In addition, there was edema extending from the bladder neck to the left hemitrigone where the ureter had previously been resected.  No other identifiable lesions were noted throughout the bladder.  The urethra was then serially dilated with Sissy Hoff sounds up to 30-French and a 28- French resectoscope sheath was placed into the bladder under direct vision.  Using loop resection, the aforementioned abnormal tissue and the left hemi trigone and bladder neck was resected in its entirety until no abnormal tissue was identified.  All of these chips were removed from the bladder.  The prostatic urethra and bladder neck was then examined and hemostasis was achieved with electrocautery.  The bladder was then emptied and reinspected to ensure hemostasis.  A 20- French three-way Foley catheter was then inserted into the bladder and  placed on continuous bladder irrigation.  The patient tolerated the procedure well and without complications.  He was able to be awakened and transferred to recovery in satisfactory condition.     [name], MD     LB/MEDQ  D:  01/25/2011  T:  01/25/2011  Job:  [id number]  Electronically Signed by [name] MD on 02/04/2011 11:34:15 PM

## 2011-02-11 ENCOUNTER — Other Ambulatory Visit: Payer: Self-pay | Admitting: Urology

## 2011-02-11 ENCOUNTER — Ambulatory Visit (HOSPITAL_COMMUNITY)
Admission: RE | Admit: 2011-02-11 | Discharge: 2011-02-11 | Disposition: A | Discharge status: Home / Self Care | Payer: Medicare Other | Source: Ambulatory Visit | Attending: Urology | Admitting: Urology

## 2011-02-11 DIAGNOSIS — Z905 Acquired absence of kidney: Secondary | ICD-10-CM | POA: Insufficient documentation

## 2011-02-11 DIAGNOSIS — C679 Malignant neoplasm of bladder, unspecified: Secondary | ICD-10-CM | POA: Insufficient documentation

## 2011-02-11 DIAGNOSIS — Z01812 Encounter for preprocedural laboratory examination: Secondary | ICD-10-CM | POA: Insufficient documentation

## 2011-02-11 LAB — BASIC METABOLIC PANEL
CO2: 27 mEq/L (ref 19–32)
Glucose, Bld: 96 mg/dL (ref 70–99)
Potassium: 4.6 mEq/L (ref 3.5–5.1)
Sodium: 136 mEq/L (ref 135–145)

## 2011-02-13 NOTE — Op Note (Signed)
  [name], [name] NO.:  [id number]  MEDICAL RECORD NO.:  [id number]  LOCATION:  DAYL                         FACILITY:  [facility]  PHYSICIAN:  [name], MD      DATE OF BIRTH:  04-25-1927  DATE OF PROCEDURE:  02/11/2011 DATE OF DISCHARGE:                              OPERATIVE REPORT   PREOPERATIVE DIAGNOSIS:  Urothelial carcinoma of the bladder.  POSTOPERATIVE DIAGNOSIS:  Urothelial carcinoma of the bladder.  PROCEDURES: 1. Cystoscopy. 2. Transurethral resection of bladder tumor (2.5 cm).  SURGEON:  [name], MD  ANESTHESIA:  General.  COMPLICATIONS:  None.  ESTIMATED BLOOD LOSS:  Minimal.  INDICATIONS:  [name] is an 77 year old gentleman who has a history of high-grade urothelial carcinoma of the left renal pelvis status post nephroureterectomy.  He was recently found to have a recurrence of high- grade urothelial carcinoma within the bladder and underwent transurethral resection which demonstrated a high-grade T1 urothelial carcinoma.  He is brought back to the operating room today for restaging purposes.  His initial tumor measured approximately 3 cm.  The potential risks, complications, and alternative treatment options were discussed in detail and informed consent was obtained.  DESCRIPTION OF PROCEDURE:  The patient was taken to the operating room and a general anesthetic was administered.  He was given preoperative antibiotics, placed in the dorsal lithotomy position, and prepped and draped in the usual sterile fashion.  Next preoperative time-out was performed.  Cystourethroscopy was then performed with the 12- and 70- degree lenses.  The patient's prior defect within the prostatic urethra at the area of the bladder neck and within the bladder on the left lateral wall was identified.  No other abnormalities were identified within the bladder.  Clear efflux of urine was seen from the right ureteral orifice.  The urethra was  then dilated serially with Sissy Hoff sounds up to 30-French and a 28-French resectoscope sheath was placed into the bladder under direct vision.  The previous tumor site was identified and was then resected with loop cutting resection with deep resections into the muscularis propria.  Hemostasis was achieved with electrocautery and all specimens were removed from the bladder.  The bladder was then emptied and reinspected and hemostasis appeared excellent.  It was not felt that the patient would require a urethral catheter.  He tolerated the procedure well and without complications.  He was able to be awakened and transferred to recovery unit in satisfactory condition.     [name], MD     LB/MEDQ  D:  02/11/2011  T:  02/11/2011  Job:  [id number]  Electronically Signed by [name] MD on 02/13/2011 10:18:26 PM

## 2011-05-19 LAB — DIFFERENTIAL
Basophils Absolute: 0
Basophils Absolute: 0.1
Basophils Relative: 0
Eosinophils Absolute: 0
Eosinophils Absolute: 0.2
Eosinophils Relative: 4
Lymphocytes Relative: 14
Lymphocytes Relative: 26
Lymphs Abs: 1
Lymphs Abs: 1
Monocytes Absolute: 0.7
Monocytes Absolute: 1.2 — ABNORMAL HIGH
Monocytes Relative: 17 — ABNORMAL HIGH
Neutro Abs: 4
Neutro Abs: 4.7
Neutrophils Relative %: 67

## 2011-05-19 LAB — COMPREHENSIVE METABOLIC PANEL
ALT: 23
AST: 30
Albumin: 4
Alkaline Phosphatase: 52
CO2: 28
Chloride: 103
Creatinine, Ser: 0.74
GFR calc Af Amer: >60
GFR calc non Af Amer: >60
Potassium: 4.2
Sodium: 138
Total Bilirubin: 1.2

## 2011-05-19 LAB — CBC
Hemoglobin: 11.6 — ABNORMAL LOW
Hemoglobin: 15
MCHC: 33.8
MCHC: 35.8
MCV: 92.9
Platelets: 185
RBC: 3.67 — ABNORMAL LOW
RBC: 4.7
WBC: 5.1
WBC: 6

## 2011-05-19 LAB — BASIC METABOLIC PANEL
BUN: 5 — ABNORMAL LOW
Calcium: 8 — ABNORMAL LOW
Calcium: 8.2 — ABNORMAL LOW
Chloride: 101
Creatinine, Ser: 0.82
GFR calc Af Amer: >60
GFR calc Af Amer: >60
GFR calc non Af Amer: >60
Potassium: 3.8
Sodium: 134 — ABNORMAL LOW

## 2011-05-19 LAB — CEA: CEA: 0.5

## 2011-05-21 LAB — CULTURE, ROUTINE-ABSCESS: Culture: NO GROWTH

## 2011-06-15 ENCOUNTER — Encounter (HOSPITAL_COMMUNITY): Payer: Medicare Other

## 2011-06-15 ENCOUNTER — Encounter (HOSPITAL_COMMUNITY): Payer: Self-pay

## 2011-06-15 DIAGNOSIS — N189 Chronic kidney disease, unspecified: Secondary | ICD-10-CM

## 2011-06-15 HISTORY — DX: Chronic kidney disease, unspecified: N18.9

## 2011-06-15 LAB — SURGICAL PCR SCREEN
MRSA, PCR: NEGATIVE
Staphylococcus aureus: NEGATIVE

## 2011-06-15 LAB — BASIC METABOLIC PANEL
CO2: 27 mEq/L (ref 19–32)
Calcium: 9.7 mg/dL (ref 8.4–10.5)
Sodium: 138 mEq/L (ref 135–145)

## 2011-06-15 LAB — CBC
MCH: 28.3 pg (ref 26.0–34.0)
Platelets: 296 10*3/uL (ref 150–400)
RBC: 4.7 MIL/uL (ref 4.22–5.81)
WBC: 8.6 10*3/uL (ref 4.0–10.5)

## 2011-06-21 ENCOUNTER — Other Ambulatory Visit: Payer: Self-pay | Admitting: Urology

## 2011-06-21 ENCOUNTER — Ambulatory Visit (HOSPITAL_COMMUNITY): Payer: Medicare Other | Admitting: Anesthesiology

## 2011-06-21 ENCOUNTER — Other Ambulatory Visit: Payer: Self-pay

## 2011-06-21 ENCOUNTER — Ambulatory Visit (HOSPITAL_COMMUNITY)
Admission: RE | Admit: 2011-06-21 | Discharge: 2011-06-22 | Disposition: A | Discharge status: Home / Self Care | Payer: Medicare Other | Source: Ambulatory Visit | Attending: Urology | Admitting: Urology

## 2011-06-21 ENCOUNTER — Encounter (HOSPITAL_COMMUNITY): Payer: Self-pay | Admitting: *Deleted

## 2011-06-21 ENCOUNTER — Encounter (HOSPITAL_COMMUNITY)
Admission: RE | Disposition: A | Discharge status: Home / Self Care | Payer: Self-pay | Source: Ambulatory Visit | Attending: Urology

## 2011-06-21 ENCOUNTER — Encounter (HOSPITAL_COMMUNITY): Payer: Self-pay | Admitting: Anesthesiology

## 2011-06-21 DIAGNOSIS — Z01810 Encounter for preprocedural cardiovascular examination: Secondary | ICD-10-CM | POA: Insufficient documentation

## 2011-06-21 DIAGNOSIS — C679 Malignant neoplasm of bladder, unspecified: Secondary | ICD-10-CM | POA: Insufficient documentation

## 2011-06-21 DIAGNOSIS — N35919 Unspecified urethral stricture, male, unspecified site: Secondary | ICD-10-CM | POA: Insufficient documentation

## 2011-06-21 DIAGNOSIS — C61 Malignant neoplasm of prostate: Secondary | ICD-10-CM | POA: Insufficient documentation

## 2011-06-21 DIAGNOSIS — I10 Essential (primary) hypertension: Secondary | ICD-10-CM | POA: Insufficient documentation

## 2011-06-21 DIAGNOSIS — Z01812 Encounter for preprocedural laboratory examination: Secondary | ICD-10-CM | POA: Insufficient documentation

## 2011-06-21 DIAGNOSIS — E7800 Pure hypercholesterolemia, unspecified: Secondary | ICD-10-CM | POA: Insufficient documentation

## 2011-06-21 DIAGNOSIS — Z79899 Other long term (current) drug therapy: Secondary | ICD-10-CM | POA: Insufficient documentation

## 2011-06-21 DIAGNOSIS — C659 Malignant neoplasm of unspecified renal pelvis: Secondary | ICD-10-CM | POA: Insufficient documentation

## 2011-06-21 DIAGNOSIS — Z7982 Long term (current) use of aspirin: Secondary | ICD-10-CM | POA: Insufficient documentation

## 2011-06-21 HISTORY — PX: TRANSURETHRAL RESECTION OF PROSTATE: SHX73

## 2011-06-21 HISTORY — PX: CYSTOSCOPY WITH URETHRAL DILATATION: SHX5125

## 2011-06-21 HISTORY — PX: TRANSURETHRAL RESECTION OF BLADDER TUMOR: SHX2575

## 2011-06-21 HISTORY — PX: CYSTOSCOPY: SHX5120

## 2011-06-21 SURGERY — CYSTOSCOPY
Anesthesia: General | Site: Urethra

## 2011-06-21 MED ORDER — LIDOCAINE HCL 1 % IJ SOLN
INTRAMUSCULAR | Status: DC | PRN
  Administered 2011-06-21: 100 mg via INTRADERMAL

## 2011-06-21 MED ORDER — PHENYLEPHRINE HCL 10 MG/ML IJ SOLN
INTRAMUSCULAR | Status: DC | PRN
  Administered 2011-06-21 (×4): 80 ug via INTRAVENOUS

## 2011-06-21 MED ORDER — PROMETHAZINE HCL 25 MG/ML IJ SOLN: 6.2500 mg | INTRAMUSCULAR | Status: DC | PRN

## 2011-06-21 MED ORDER — STERILE WATER FOR IRRIGATION IR SOLN
Status: DC | PRN
  Administered 2011-06-21: 30 mL

## 2011-06-21 MED ORDER — FENTANYL CITRATE 0.05 MG/ML IJ SOLN
INTRAMUSCULAR | Status: DC | PRN
  Administered 2011-06-21: 25 ug via INTRAVENOUS
  Administered 2011-06-21: 50 ug via INTRAVENOUS
  Administered 2011-06-21: 25 ug via INTRAVENOUS

## 2011-06-21 MED ORDER — CIPROFLOXACIN IN D5W 400 MG/200ML IV SOLN
INTRAVENOUS | Status: DC | PRN
  Administered 2011-06-21: 400 mg via INTRAVENOUS

## 2011-06-21 MED ORDER — INDIGOTINDISULFONATE SODIUM 8 MG/ML IJ SOLN
INTRAMUSCULAR | Status: DC | PRN
  Administered 2011-06-21 (×2): 5 mL via INTRAVENOUS

## 2011-06-21 MED ORDER — LACTATED RINGERS IV SOLN
INTRAVENOUS | Status: DC
  Administered 2011-06-21 (×2): via INTRAVENOUS

## 2011-06-21 MED ORDER — ONDANSETRON HCL 4 MG/2ML IJ SOLN
INTRAMUSCULAR | Status: DC | PRN
  Administered 2011-06-21: 4 mg via INTRAVENOUS

## 2011-06-21 MED ORDER — SODIUM CHLORIDE 0.9 % IR SOLN
Status: DC | PRN
  Administered 2011-06-21: 19:00:00
  Administered 2011-06-21: 6000 mL

## 2011-06-21 MED ORDER — GLYCINE 1.5 % IR SOLN
Status: DC | PRN
  Administered 2011-06-21: 6000 mL
  Administered 2011-06-21: 19:00:00

## 2011-06-21 MED ORDER — INDIGOTINDISULFONATE SODIUM 8 MG/ML IJ SOLN
INTRAMUSCULAR | Status: AC
  Filled 2011-06-21: qty 5

## 2011-06-21 MED ORDER — PROPOFOL 10 MG/ML IV EMUL
INTRAVENOUS | Status: DC | PRN
  Administered 2011-06-21: 180 mg via INTRAVENOUS

## 2011-06-21 MED ORDER — FENTANYL CITRATE 0.05 MG/ML IJ SOLN: 25.0000 ug | INTRAMUSCULAR | Status: DC | PRN

## 2011-06-21 MED ORDER — HYDROCODONE-ACETAMINOPHEN 5-500 MG PO CAPS: 1.0000 | ORAL_CAPSULE | ORAL | Status: AC | PRN

## 2011-06-21 SURGICAL SUPPLY — 27 items
BAG URINE DRAINAGE (UROLOGICAL SUPPLIES) ×6 IMPLANT
BAG URO CATCHER STRL LF (DRAPE) ×3 IMPLANT
BLADE SURG 15 STRL LF DISP TIS (BLADE) IMPLANT
BLADE SURG 15 STRL SS (BLADE)
CATH FOLEY 3WAY 30CC 22FR (CATHETERS) ×3 IMPLANT
CATH HEMA 3WAY 30CC 22FR COUDE (CATHETERS) ×3 IMPLANT
CLOTH BEACON ORANGE TIMEOUT ST (SAFETY) ×3 IMPLANT
DRAPE CAMERA CLOSED 9X96 (DRAPES) ×3 IMPLANT
ELECT REM PT RETURN 9FT ADLT (ELECTROSURGICAL) ×3
ELECTRODE REM PT RTRN 9FT ADLT (ELECTROSURGICAL) ×2 IMPLANT
EVACUATOR MICROVAS BLADDER (UROLOGICAL SUPPLIES) ×3 IMPLANT
GLOVE BIOGEL M STRL SZ7.5 (GLOVE) ×3 IMPLANT
GLOVE SURG SS PI 8.0 STRL IVOR (GLOVE) ×3 IMPLANT
GLYCINE 1.5% IRRIG UROMATIC (IV SOLUTION) ×18 IMPLANT
GOWN STRL NON-REIN LRG LVL3 (GOWN DISPOSABLE) ×6 IMPLANT
GUIDEWIRE STR DUAL SENSOR (WIRE) ×3 IMPLANT
HOLDER FOLEY CATH W/STRAP (MISCELLANEOUS) IMPLANT
KIT ASPIRATION TUBING (SET/KITS/TRAYS/PACK) ×3 IMPLANT
LOOP CUTTING STRAIGHT (MISCELLANEOUS) IMPLANT
LOOPS CUTTING 24FR (ELECTROSURGICAL) ×3 IMPLANT
LOOPS RESECTOSCOPE DISP (ELECTROSURGICAL) ×3 IMPLANT
MANIFOLD NEPTUNE II (INSTRUMENTS) ×3 IMPLANT
PACK CYSTO (CUSTOM PROCEDURE TRAY) ×3 IMPLANT
SUT ETHILON 3 0 PS 1 (SUTURE) IMPLANT
SYR 30ML LL (SYRINGE) ×3 IMPLANT
SYRINGE IRR TOOMEY STRL 70CC (SYRINGE) ×3 IMPLANT
TUBING CONNECTING 10 (TUBING) ×3 IMPLANT

## 2011-06-21 NOTE — Op Note (Addendum)
Preoperative diagnosis: 1. Bladder cancer  Postoperative diagnosis:  1. Bladder cancer 2. Urethral stricture  Procedure:  1. Cystoscopy 2. Transurethral resection of bladder tumor (bladder neck 2-5 cm) 3. Dilation of urethral stricture  Surgeon: Moody Bruins. M.D.  Anesthesia: General  Complications: None  Intraoperative findings 1. Erythema and edematous tissue at left bladder neck  EBL: Minimal  Specimens: 1. Bladder neck and prostate  Disposition of specimens: Pathology  Indication: Mr. [name] is a patient who has a history of bladder cancer. After reviewing the management options for treatment, he elected to proceed with the above surgical procedure(s). We have discussed the potential benefits and risks of the procedure, side effects of the proposed treatment, the likelihood of the patient achieving the goals of the procedure, and any potential problems that might occur during the procedure or recuperation. Informed consent has been obtained.  Description of procedure:  The patient was taken to the operating room and general anesthesia was induced.  The patient was placed in the dorsal lithotomy position, prepped and draped in the usual sterile fashion, and preoperative antibiotics were administered. A preoperative time-out was performed.   Cystourethroscopy was performed with a 22 Fr cystoscope.  The patient's urethra was examined and demonstrated non-healing tissue within the prostatic urethra and trabeculation throughout the bladder. There was noted to be edematous and erythematous tissue at the bladder neck on the left side concerning for possible tumor recurrence.   Attempts to place a resectoscope were initially unsuccessful due to a bulbar urethral stricture.  While the 22 Fr scope passed easily, a 28 Fr and then a 26 Fr were unable to be placed. The urethral stricture area was ballooned with an Ultraxx nephrostomy balloon dilator and attempts to replace the  26 Fr resectoscope were again unsuccessful.  Finally, a 0.38 guide wire was placed into the bladder and Heyman dilators were passed over the wire serially dilating the strictured area to 28 Fr. The 26 Fr resectoscope was then able to be placed into the bladder. The bladder was then re-examined after the resectoscope was placed.   Using loop cautery resection, the entire abnormal area of the bladder neck and the non-healing tissue within the prostatic urethra was resected and removed for permanent pathologic analysis.   Hemostasis was then achieved with the loop cautery and the bladder was emptied and reinspected with no further bleeding noted at the end of the procedure.  All specimens were confirmed to have been removed from the bladder.   The bladder was then emptied and a 22 Fr 3 way catheter was placed over a wire and the procedure ended.  The patient appeared to tolerate the procedure well and without complications.  The patient was able to be awakened and transferred to the recovery unit in satisfactory condition.

## 2011-06-21 NOTE — Anesthesia Preprocedure Evaluation (Addendum)
Anesthesia Evaluation  Patient identified by MRN, date of birth, ID band Patient awake    Reviewed: Allergy & Precautions, H&P , NPO status , Patient's Chart, lab work & pertinent test results, reviewed documented beta blocker date and time   Airway Mallampati: II TM Distance: >3 FB Neck ROM: Full    Dental  (+) Partial Upper, Partial Lower, Caps and Dental Advisory Given   Pulmonary neg pulmonary ROS,    + decreased breath sounds      Cardiovascular hypertension, Pt. on medications Regular Normal Denies cardiac symptoms   Neuro/Psych Negative Neurological ROS  Negative Psych ROS   GI/Hepatic negative GI ROS, Neg liver ROS,   Endo/Other  Negative Endocrine ROS  Renal/GU Renal diseaseBladder lesion S/p left nephrectomy  Genitourinary negative   Musculoskeletal negative musculoskeletal ROS (+)   Abdominal   Peds negative pediatric ROS (+)  Hematology negative hematology ROS (+)   Anesthesia Other Findings Front caps  Reproductive/Obstetrics negative OB ROS                           Anesthesia Physical Anesthesia Plan  ASA: II  Anesthesia Plan: General   Post-op Pain Management:    Induction: Intravenous  Airway Management Planned: LMA  Additional Equipment:   Intra-op Plan:   Post-operative Plan: Extubation in OR  Informed Consent: I have reviewed the patients History and Physical, chart, labs and discussed the procedure including the risks, benefits and alternatives for the proposed anesthesia with the patient or authorized representative who has indicated his/her understanding and acceptance.   Dental advisory given  Plan Discussed with: CRNA and Surgeon  Anesthesia Plan Comments:         Anesthesia Quick Evaluation

## 2011-06-21 NOTE — Anesthesia Postprocedure Evaluation (Signed)
  Anesthesia Post-op Note  Patient: [name]  Procedure(s) Performed:  CYSTOSCOPY; TRANSURETHRAL RESECTION OF BLADDER TUMOR (TURBT); TRANSURETHRAL RESECTION OF THE PROSTATE (TURP); CYSTOSCOPY WITH URETHRAL DILATATION  Patient Location: PACU  Anesthesia Type: General  Level of Consciousness: oriented and sedated  Airway and Oxygen Therapy: Patient Spontanous Breathing and Patient connected to nasal cannula oxygen  Post-op Pain: mild  Post-op Assessment: Patient's Cardiovascular Status Stable, Respiratory Function Stable and Patent Airway  Post-op Vital Signs: stable  Complications: No apparent anesthesia complications

## 2011-06-21 NOTE — H&P (Signed)
History of Present Illness  [name] is an 77 year old with the following urologic history:  1) Prostate cancer: He is s/p treatment with brachytherapy in December 2002. He has had no evidence for cancer recurrence since treatment.  2) Urothelial carcinoma of the left renal pelvis: He is s/p an open left nephroureterectomy on 06/04/10 for pT2 Nx Mx, high grade urothelial carcinoma of the left renal pelvis.  His baseline global renal function was normal with a creatinine of 0.9 and eGFR of 86 ml/min.  Feb 2012: Positive cytology Mar 2012: Bladder biopsies and right retrograde pyelography negative for malignancy June 2012: TUR of bladder neck lesion - High grade T1 urothelial carcinoma July 2012: Restaging TUR negative for malignancy  3) BPH: He does have gross hematuria intermittently felt to be in part related to BPH. Current treatment: Finasteride (began April 2012)  ** Pertinent medical history includes a history of a AAA repair and colon resection for a non-malignant polyp. 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 He has also undergone bilateral total knee replacements and a right total hip replacement. Both of his parents lived into their early to mid 16s. He has previously seen Dr. Leodis Sias in the past for cardiac evaluation prior to his AAA repair. He did smoke 1 PPD for 25 years and quit in 1984.   Past Medical History Problems  1. History of  Aneurysm Of The Abdominal Aorta 441.4 2. History of  Hypercholesterolemia 272.0 3. History of  Hypertension 401.9 4. History of  Nephrolithiasis V13.01 5. History of  Osteoarthritis V13.4 6. History of  Prostate Cancer V10.46 7. 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 History of  Aortic Aneurysm Repair 2. History of  Cystoscopy With Biopsy 3. History of  Cystoscopy With Fulguration Medium Lesion (2-5cm) 4. History of  Cystoscopy  With Fulguration Small Lesion (5-68mm) 5. History of  Cystoscopy With Insertion Of Ureteral Stent Left 6. History of  Cystoscopy With Pyeloscopy Left 7. History of  Knee Replacement Bilateral 8. History of  Lithotripsy Left 9. History of  Nephrectomy With Separate Ureterectomy 10. History of  Nephrectomy With Total Ureterectomy Left 11. History of  Partial Colectomy 12. History of  Prostate Place Interstitial Dev For Radiation Guide Multiple 13. History of  Total Hip Replacement Right  Current Meds 1. AmLODIPine Besylate 10 MG Oral Tablet; Therapy: (Recorded:27Oct2010) to 2. Aspirin 81 MG Oral Tablet; Therapy: (Recorded:27Oct2010) to 3. Detrol LA 4 MG Oral Capsule Extended Release 24 Hour; TAKE 1 CAPSULE DAILY; Therapy:  02Jul2012 to (Evaluate:01Aug2012)  Requested for: 02Jul2012; Last Rx:02Jul2012 4. Finasteride 5 MG Oral Tablet; Take 1 tablet by mouth every day; Therapy: 12Apr2012 to  (Evaluate:09Oct2012)  Requested for: 12Apr2012; Last Rx:12Apr2012 5. Fish Oil CAPS; Therapy: (Recorded:27Oct2010) to 6. PARoxetine HCl TB24; Therapy: (Recorded:27Oct2010) to 7. Vitamin B-12 TABS; Therapy: (Recorded:27Oct2010) to 8. Zocor 40 MG Oral Tablet; Therapy: (Recorded:27Oct2010) to  Allergies Medication  1. Penicillins  Family History Problems  1. Family history of  Father Deceased At Age ____ 90 2. Family history of  Mother Deceased At Age ____ 110 Denied  3. Family history of  Bladder Cancer 4. Family history of  Kidney Cancer 5. Family history of  Prostate Cancer  Social History Problems  1. Former Smoker smoked x 29yrs, quit 26 yrs ago 2. Marital History - Currently Married 3. Occupation: retired Denied  4. History of  Alcohol Use  Vitals Vital Signs [Data Includes: Last 1 Day]  19Oct2012 01:46PM  BMI Calculated: 30.8 BSA Calculated: 2.19 Height: 5 ft 11 in Weight: 220 lb  Blood Pressure: 125 / 74 Temperature: 97.8 F Heart Rate: 101  Physical Exam Constitutional:  Well nourished and well developed . No acute distress.  Pulmonary: No respiratory distress and normal respiratory rhythm and effort.  Cardiovascular: Heart rate and rhythm are normal . No peripheral edema.  Genitourinary: Examination of the penis demonstrates no lesions and a normal meatus.    Results/Data Urine [Data Includes: Last 1 Day]  19Oct2012  COLOR: YELLOW  Reference Range YELLOW APPEARANCE: CLEAR  Reference Range CLEAR SPECIFIC GRAVITY: 1.020  Reference Range 1.005-1.030 pH: 6.0  Reference Range 5.0-8.0 GLUCOSE: NEG mg/dL Reference Range NEG BILIRUBIN: NEG  Reference Range NEG KETONE: NEG mg/dL Reference Range NEG BLOOD: MOD  Abnormal Reference Range NEG PROTEIN: 30 mg/dL Abnormal Reference Range NEG UROBILINOGEN: 0.2 mg/dL Reference Range 1.6-1.0 NITRITE: NEG  Reference Range NEG LEUKOCYTE ESTERASE: SMALL  Abnormal Reference Range NEG SQUAMOUS EPITHELIAL/HPF: FEW  Reference Range RARE WBC: 11-20 WBC/hpf Abnormal Reference Range <4 RBC: 4-6 RBC/hpf Abnormal Reference Range <4 BACTERIA: FEW  Abnormal Reference Range RARE CRYSTALS: NONE SEEN  Reference Range NEG CASTS: NONE SEEN  Reference Range NEG  Procedure  Procedure: Cystoscopy  Indication: History of Urothelial Carcinoma.  Informed Consent: Risks, benefits, and potential adverse events were discussed and informed consent was obtained from the patient.  Prep: The patient was prepped with betadine.  Anesthesia:. Local anesthesia was administered intraurethrally with 2% lidocaine jelly.  Antibiotic prophylaxis: Ciprofloxacin.  Procedure Note:  Urethral meatus:. No abnormalities.  Anterior urethra: No abnormalities.  Prostatic urethra:. His prostatic urethra demonstrates friable tissue consistent with non-healing urothelium related to his prior resection.  Bladder: Visulization was obscured due to cloudy urine. The left ureteral orifice was absent. A systematic exam was performed of the bladder. There was noted to be  significant edema toward the left bladder neck extending anteriorly toward the lateral aspect of the bladder neck on the left side. There was some dystrophic calcification toward the posterior bladder neck extending into the prostate. No definite tumors were seen but visualization was somewhat obscured by cloudy urine. The patient tolerated the procedure well.  Complications: None.    Assessment Assessed  1. Bladder Cancer 188.9 2. Prostate Cancer 185 3. 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 Urothelial carcinoma: At this time, I do think it would be important to go back to the operating room for cystoscopy and transurethral resection of the abnormal areas near the bladder neck to exclude the possibility of malignancy. This also might help his nonhealing areas to heal better and help improve his voiding symptoms. We have reviewed the potential risks and complications associated with cystoscopy and transurethral resection of the bladder/prostate. I likely will keep him overnight with the catheter indwelling.    Verified Results URINE CULTURE1 19Oct2012 02:15PM1 Lyda Perone  Source : CLEAN CATCH SPECIMEN TYPE: CLEAN CATCH   Test Name Result Flag Reference  CULTURE, URINE1 Culture, Urine1    ===== COLONY COUNT: =====  NO GROWTH   FINAL REPORT: NO GROWTH

## 2011-06-21 NOTE — Preoperative (Signed)
Beta Blockers   Reason not to administer Beta Blockers:Not Applicable 

## 2011-06-21 NOTE — Transfer of Care (Signed)
Immediate Anesthesia Transfer of Care Note  Patient: [name]  Procedure(s) Performed:  CYSTOSCOPY; TRANSURETHRAL RESECTION OF BLADDER TUMOR (TURBT); TRANSURETHRAL RESECTION OF THE PROSTATE (TURP); CYSTOSCOPY WITH URETHRAL DILATATION  Patient Location: PACU  Anesthesia Type: General  Level of Consciousness: awake, alert , oriented, patient cooperative and responds to stimulation  Airway & Oxygen Therapy: Patient Spontanous Breathing and Patient connected to face mask oxygen  Post-op Assessment: Report given to PACU RN, Post -op Vital signs reviewed and stable and Patient moving all extremities  Post vital signs: stable  Complications: No apparent anesthesia complications

## 2011-06-22 MED ORDER — DIPHENHYDRAMINE HCL 50 MG/ML IJ SOLN: 12.5000 mg | Freq: Four times a day (QID) | INTRAMUSCULAR | Status: DC | PRN

## 2011-06-22 MED ORDER — DOCUSATE SODIUM 100 MG PO CAPS
100.0000 mg | ORAL_CAPSULE | Freq: Two times a day (BID) | ORAL | Status: DC
  Administered 2011-06-22: 100 mg via ORAL
  Filled 2011-06-22 (×2): qty 1

## 2011-06-22 MED ORDER — CIPROFLOXACIN IN D5W 400 MG/200ML IV SOLN
400.0000 mg | Freq: Two times a day (BID) | INTRAVENOUS | Status: DC
  Administered 2011-06-22: 400 mg via INTRAVENOUS
  Filled 2011-06-22 (×3): qty 200

## 2011-06-22 MED ORDER — ONDANSETRON HCL 4 MG/2ML IJ SOLN: 4.0000 mg | INTRAMUSCULAR | Status: DC | PRN

## 2011-06-22 MED ORDER — ZOLPIDEM TARTRATE 5 MG PO TABS: 5.0000 mg | ORAL_TABLET | Freq: Every evening | ORAL | Status: DC | PRN

## 2011-06-22 MED ORDER — DIPHENHYDRAMINE HCL 12.5 MG/5ML PO ELIX: 12.5000 mg | ORAL_SOLUTION | Freq: Four times a day (QID) | ORAL | Status: DC | PRN

## 2011-06-22 MED ORDER — BELLADONNA ALKALOIDS-OPIUM 16.2-60 MG RE SUPP: 1.0000 | Freq: Four times a day (QID) | RECTAL | Status: DC | PRN

## 2011-06-22 MED ORDER — HYDROCODONE-ACETAMINOPHEN 5-325 MG PO TABS
1.0000 | ORAL_TABLET | ORAL | Status: DC | PRN
  Administered 2011-06-22: 1 via ORAL
  Filled 2011-06-22: qty 1

## 2011-06-22 NOTE — Progress Notes (Signed)
Post-op note  Subjective: The patient is doing well.  No complaints.  Objective: Vital signs in last 24 hours: Temp:  [97.4 F (36.3 C)-99.2 F (37.3 C)] 99.2 F (37.3 C) (11/06 0532) Pulse Rate:  [76-88] 76  (11/06 0532) Resp:  [10-18] 18  (11/06 0532) BP: (115-147)/(64-77) 115/64 mmHg (11/06 0532) SpO2:  [96 %-100 %] 96 % (11/06 0532) Weight:  [102.1 kg (225 lb 1.4 oz)] 225 lb 1.4 oz (102.1 kg) (11/05 2113)  Intake/Output from previous day: 11/05 0701 - 11/06 0700 In: 4880 [P.O.:480; I.V.:2000] Out: 5850 [Urine:5750; Blood:100] Intake/Output this shift:    Physical Exam:  General: Alert and oriented. Abdomen: Soft, Nondistended. Incisions: Clean and dry. Urine: Clear with CBI gtt off.  Lab Results:   Assessment/Plan: POD#1  1) D/C home with catheter.   Rolly Salter, Montez Hageman. MD   LOS: 1 day   Isola Mehlman,LES 06/22/2011, 7:37 AM

## 2011-06-22 NOTE — Discharge Summary (Signed)
Physician Discharge Summary  Patient ID: [name] MRN: [id number] DOB/AGE: October 20, 1926 77 y.o.  Admit date: 06/21/2011 Discharge date: 06/22/2011  Admission Diagnoses: Bladder cancer  Discharge Diagnoses: Bladder Cancer and urethral stricture  Discharged Condition: Good  Hospital Course: He tolerated the procedure well.  He was monitored overnight and his urine remained clear.  CBI was discontinued and his urine remained clear.   Treatments: TURBT/prostate and urethral dilation  Disposition: Home or Self Care   Current Discharge Medication List    START taking these medications   Details  hydrocodone-acetaminophen (LORCET-HD) 5-500 MG per capsule Take 1 capsule by mouth every 4 (four) hours as needed for pain. Qty: 30 capsule, Refills: 0      CONTINUE these medications which have NOT CHANGED   Details  acetaminophen (TYLENOL) 500 MG tablet Take 500-1,000 mg by mouth every 6 (six) hours as needed.      amLODipine (NORVASC) 10 MG tablet Take 10 mg by mouth every morning.      finasteride (PROSCAR) 5 MG tablet Take 5 mg by mouth daily after lunch daily after lunch.      simvastatin (ZOCOR) 80 MG tablet Take 40 mg by mouth at bedtime.         Follow-up Information    Follow up with Crecencio Mc, MD. (Will call to schedule)    Contact information:   [address] [place] [address], [facility] [place] [address] [phone]          Signed: Crecencio Mc 06/22/2011, 7:41 AM

## 2011-06-29 ENCOUNTER — Encounter (HOSPITAL_COMMUNITY): Payer: Self-pay | Admitting: Urology

## 2011-06-30 ENCOUNTER — Telehealth: Payer: Self-pay | Admitting: Oncology

## 2011-06-30 NOTE — Telephone Encounter (Signed)
gv pt appt for 11/21 @ 10:30 am w/FS.

## 2011-07-02 ENCOUNTER — Other Ambulatory Visit: Payer: Self-pay | Admitting: Oncology

## 2011-07-02 DIAGNOSIS — C679 Malignant neoplasm of bladder, unspecified: Secondary | ICD-10-CM

## 2011-07-07 ENCOUNTER — Other Ambulatory Visit (HOSPITAL_BASED_OUTPATIENT_CLINIC_OR_DEPARTMENT_OTHER): Payer: Medicare Other

## 2011-07-07 ENCOUNTER — Ambulatory Visit: Payer: Medicare Other

## 2011-07-07 ENCOUNTER — Telehealth: Payer: Self-pay | Admitting: Oncology

## 2011-07-07 ENCOUNTER — Ambulatory Visit (HOSPITAL_BASED_OUTPATIENT_CLINIC_OR_DEPARTMENT_OTHER): Payer: Medicare Other | Admitting: Oncology

## 2011-07-07 VITALS — BP 120/77 | HR 93 | Temp 98.6°F | Ht 71.0 in | Wt 222.0 lb

## 2011-07-07 DIAGNOSIS — C679 Malignant neoplasm of bladder, unspecified: Secondary | ICD-10-CM

## 2011-07-07 DIAGNOSIS — Z8546 Personal history of malignant neoplasm of prostate: Secondary | ICD-10-CM

## 2011-07-07 DIAGNOSIS — R599 Enlarged lymph nodes, unspecified: Secondary | ICD-10-CM

## 2011-07-07 DIAGNOSIS — R918 Other nonspecific abnormal finding of lung field: Secondary | ICD-10-CM

## 2011-07-07 LAB — CBC WITH DIFFERENTIAL/PLATELET
Basophils Absolute: 0 10*3/uL (ref 0.0–0.1)
EOS%: 3.8 % (ref 0.0–7.0)
LYMPH%: 15.9 % (ref 14.0–49.0)
MCH: 28.4 pg (ref 27.2–33.4)
MCV: 85.1 fL (ref 79.3–98.0)
MONO%: 10.1 % (ref 0.0–14.0)
RBC: 4.35 10*6/uL (ref 4.20–5.82)
RDW: 14.6 % (ref 11.0–14.6)

## 2011-07-07 LAB — COMPREHENSIVE METABOLIC PANEL
AST: 14 U/L (ref 0–37)
Albumin: 3.7 g/dL (ref 3.5–5.2)
Alkaline Phosphatase: 58 U/L (ref 39–117)
BUN: 17 mg/dL (ref 6–23)
Potassium: 4.3 mEq/L (ref 3.5–5.3)
Sodium: 137 mEq/L (ref 135–145)
Total Bilirubin: 0.6 mg/dL (ref 0.3–1.2)

## 2011-07-07 NOTE — Progress Notes (Signed)
Note Dictated

## 2011-07-07 NOTE — Telephone Encounter (Signed)
gve the pt his dec 2012 appt calendar along with the pet scan appt. °

## 2011-07-12 NOTE — Progress Notes (Signed)
CC:   [name], MD [name], MD  PRINCIPAL DIAGNOSIS:  This is a 77 year old gentleman, native of [place] and retired from [employer] in the [employer] for the last 25 years.  This is a gentleman with a past medical history significant for an abdominal aortic aneurysm repair.  He also has a history of prostate cancer, status post brachytherapy in 2002.  He continues to have no evidence of disease at this time.  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  He underwent an open left nephroureterectomy done on June 04, 2010.  His pathology from that showed [phone], showed that he had a papillary invasive high-grade urothelial carcinoma with negative urinary bladder cuff margins.  The tumor was 3.8 cm.  No lymphovascular invasion.  Again, was a T2 NX disease.  The patient has done relatively well.  However, on followup imaging studies, as well as cystoscopy showed that he had a positive cytology in February 2012.  He subsequently in June 2012 had a TUR of a bladder neck lesion that showed a T1 urothelial carcinoma.  He had another restaging TUR that was negative for malignancy, however, most recently, on November 5th had a repeat cystoscopy that showed a bladder neck tumor, again with high- grade invasive urothelial carcinoma with muscle invasion.  He had a staging workup done including CT scan of the chest, abdomen, and pelvis. CT scan of chest showed that there are 2 pulmonary nodules in the left lung in the left lower lobe measuring 1.1 cm, in the left upper lobe measuring 0.7 cm.  The CT scan of the abdomen and pelvis showed lower abdominal lymph node.  There is a 1.3-cm left periaortic lymph node that has increased actually in size from 0.7 to 1.3 and a lymph node in the common iliac measuring 0.8, previously was 0.3 cm.  Clinically Mr. Wiebelhaus is really  asymptomatic.  He did not report any abdominal pain. He had not reported any chest pain.  Had not reported any hematuria. Had not reported any flank pain.  Had not reported really any major changes in his performance status or activity level at this time.  He has continued to perform activities of daily living without any hindrance or decline.  He has continued to drive and attends to again activities of daily living without problems at this point.  REVIEW OF SYSTEMS:  Does not report any headaches, blurry vision, double vision.  Does not report any motor or sensory neuropathy.  Does not report any alteration in mental status.  Does not report psychiatric issues or depression.  Does not report any fever, chills, sweats.  Does not report any cough, hemoptysis, hematemesis.  No nausea or vomiting. No abdominal pain.  No hematochezia, no melena.  No genitourinary complaints.  Rest of review of systems is unremarkable.  PAST MEDICAL HISTORY:  Significant for hyperlipidemia and hypertension. History of nephrolithiasis, osteoarthritis.  History of prostate cancer. He is also status post 2 knee replacements, hip replacement.  Had lithotripsy in the past.  MEDICATIONS:  He is on amlodipine, aspirin, Detrol, finasteride, fish oil, paroxetine, vitamin B12, and Zocor.  ALLERGIES:  To penicillin.  FAMILY HISTORY:  Really unremarkable for any genitourinary malignancy. Both his parents died of old age in their 90s.  SOCIAL HISTORY:  He is married.  He has 2 children.  Denied any alcohol or tobacco abuse.  He quit smoking about 26  years ago after smoking for 30 years.  PHYSICAL EXAMINATION:  General:  Alert, awake gentleman, appeared in no active distress.  Vital Signs:  Blood pressure is 120/77, pulse is 93, respirations 18, temperature is 98.6.  ECOG performance status is 1. HEENT:  Head is normocephalic, atraumatic.  Pupils equal, round, reactive to light.  Oral mucosa moist and pink.  Neck:   Supple without lymphadenopathy.  Heart:  Regular rate and rhythm, S1 and S2.  Lungs: Clear to auscultation without rhonchi, wheezes, or dullness to percussion.  Abdomen:  Soft, nontender.  No hepatosplenomegaly. Extremities:  No clubbing, cyanosis, or edema.  Neurologically:  Intact motor, sensory, and deep tendon reflexes.  LABORATORY DATA:  Showed a hemoglobin of 12.4, white cell count of 8.2, platelet count of 342.  ASSESSMENT AND PLAN:  This is a pleasant 77 year old gentleman with the following issues: 1. 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     He had a documented invasive tumor of the left renal pelvis, status     post left nephroureterectomy, now with recurrence into the bladder     neck area, biopsy proven to be muscle invasive.  He has also had     imaging studies that showed possible lymphadenopathy, as well as     lung nodules.  I had a lengthy discussion today, discussing really     the treatment option at this point available for Mr. Sharron.  At     this point, if he does have indeed stage IV disease with metastatic     lymphadenopathy and lung nodules, then obviously that the only     thing we have to offer him is palliative systemic chemotherapy.     However, given the small nature of these lung nodules and these     lymph nodes, this certainly could be benign in etiology, and if     they are indeed benign, then all our options are open at this time,     including radical cystectomy plus-or-minus a neoadjuvant     chemotherapy.  I do not think a palliative or alternative bladder-     preserving approach with radiation therapy is an option, given his     seed implant history.  So at this point, it is critical really to     document whether he has metastatic disease or not.  If he indeed     that does have metastatic disease, then I think the upfront     chemotherapy would be the way to go.  To document that, I would     like to obtain a  PET-CT scan to see if we can detect these     pulmonary nodules and to see whether he has indeed a primary lung     malignancy, which also could be a possibility.  I will also would     like to have him presented in the Multidisciplinary Tumor Board and     if it is indeed feasible to biopsy some of these nodule, I think     that would be ideal.  However, if we could not prove or disprove     malignancy in these nodules, I presume we can potentially treat him     neoadjuvantly with chemotherapy and look for a response and maybe     if he had a complete response, we could certainly assess the     possibility of salvage cystectomy at that time.  I think the  plan     at this point is to obtain a PET-CT scan, discuss his case in the     Multidisciplinary Tumor Board, and I will ask him to come back and     follow up with me the 1st week of December, probably discuss     chemotherapy neoadjuvantly at this time.  I think he is medically     fit.  We will check his creatinine to make sure it is adequate and     will make sure he is a cisplatin adequate candidate at this time. 2. History of prostate cancer with possible seed implant.  He has had     pelvic radiation due to that and again, I do not see any evidence     of cancer recurrence, but that will play a role in his treatment     option down the line.    ______________________________ Benjiman Core, M.D. FNS/MEDQ  D:  07/07/2011  T:  07/07/2011  Job:  [id number]

## 2011-07-15 ENCOUNTER — Encounter: Payer: Self-pay | Admitting: *Deleted

## 2011-07-20 ENCOUNTER — Encounter (HOSPITAL_COMMUNITY)
Admission: RE | Admit: 2011-07-20 | Discharge: 2011-07-20 | Disposition: A | Discharge status: Home / Self Care | Payer: Medicare Other | Source: Ambulatory Visit | Attending: Oncology | Admitting: Oncology

## 2011-07-20 ENCOUNTER — Encounter (HOSPITAL_COMMUNITY): Payer: Self-pay

## 2011-07-20 DIAGNOSIS — R918 Other nonspecific abnormal finding of lung field: Secondary | ICD-10-CM

## 2011-07-20 DIAGNOSIS — Z8551 Personal history of malignant neoplasm of bladder: Secondary | ICD-10-CM | POA: Insufficient documentation

## 2011-07-20 DIAGNOSIS — R222 Localized swelling, mass and lump, trunk: Secondary | ICD-10-CM | POA: Insufficient documentation

## 2011-07-20 DIAGNOSIS — C3490 Malignant neoplasm of unspecified part of unspecified bronchus or lung: Secondary | ICD-10-CM | POA: Insufficient documentation

## 2011-07-20 MED ORDER — FLUDEOXYGLUCOSE F - 18 (FDG) INJECTION
18.4000 | Freq: Once | INTRAVENOUS | Status: AC | PRN
  Administered 2011-07-20: 18.4 via INTRAVENOUS

## 2011-07-27 ENCOUNTER — Ambulatory Visit (HOSPITAL_BASED_OUTPATIENT_CLINIC_OR_DEPARTMENT_OTHER): Payer: Medicare Other | Admitting: Oncology

## 2011-07-27 DIAGNOSIS — C801 Malignant (primary) neoplasm, unspecified: Secondary | ICD-10-CM

## 2011-07-27 DIAGNOSIS — C675 Malignant neoplasm of bladder neck: Secondary | ICD-10-CM

## 2011-07-27 DIAGNOSIS — Z8546 Personal history of malignant neoplasm of prostate: Secondary | ICD-10-CM

## 2011-07-27 DIAGNOSIS — C7800 Secondary malignant neoplasm of unspecified lung: Secondary | ICD-10-CM

## 2011-07-27 DIAGNOSIS — C679 Malignant neoplasm of bladder, unspecified: Secondary | ICD-10-CM

## 2011-07-27 MED ORDER — PROCHLORPERAZINE MALEATE 10 MG PO TABS: 10.0000 mg | ORAL_TABLET | Freq: Four times a day (QID) | ORAL | Status: DC | PRN

## 2011-07-27 MED ORDER — ONDANSETRON HCL 8 MG PO TABS: ORAL_TABLET | ORAL | Status: DC

## 2011-07-27 MED ORDER — PROCHLORPERAZINE 25 MG RE SUPP: 25.0000 mg | Freq: Two times a day (BID) | RECTAL | Status: DC | PRN

## 2011-07-27 MED ORDER — LORAZEPAM 0.5 MG PO TABS: 0.5000 mg | ORAL_TABLET | Freq: Four times a day (QID) | ORAL | Status: DC | PRN

## 2011-07-27 MED ORDER — DEXAMETHASONE 4 MG PO TABS: ORAL_TABLET | ORAL | Status: DC

## 2011-07-27 NOTE — Progress Notes (Signed)
Hematology and Oncology Follow Up Visit  [name] [id number] 08/22/1926 77 y.o. 07/27/2011 4:48 PM   CC: Brian Purpura, MD  Brian Kindle, MD   Principle Diagnosis: This is a pleasant 77 year old gentleman with the following issues:  1. 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 Now has metastatic disease with lung nodules. 2. Prostate cancer: He is s/p treatment with brachytherapy in December 2002. He has had no evidence for cancer recurrence since treatment.    Prior Therapy: He underwent an open left nephroureterectomy done on June 04, 2010. His pathology from that showed [phone], showed that he had a papillary invasive high-grade urothelial carcinoma with negative urinary bladder cuff margins. The tumor was 3.8 cm. No lymphovascular invasion. Again, was a T2 NX disease.   Current therapy: Under consideration for chemotherapy.  Interim History: [name] presents for a follow up visit since his last visit on 11/21. He is really asymptomatic. He did not report any abdominal pain. He had not reported any chest pain. Had not reported any hematuria. Had not reported any flank pain. Had not reported really any major changes in his performance status or activity level at this time. He has continued to perform activities of daily living without any hindrance or decline. He has continued to drive and attends to again activities of daily living without problems at this point. He still has a foley catheter in place.   Medications: I have reviewed the patient's current medications. Current outpatient prescriptions:acetaminophen (TYLENOL) 500 MG tablet, Take 500-1,000 mg by mouth every 6 (six) hours as needed.  , Disp: , Rfl: ;  amLODipine (NORVASC) 10 MG tablet, Take 10 mg by mouth every morning.  , Disp: , Rfl: ;  finasteride (PROSCAR) 5 MG tablet, Take 5 mg by mouth daily after lunch daily after  lunch.  , Disp: , Rfl: ;  simvastatin (ZOCOR) 80 MG tablet, Take 40 mg by mouth at bedtime.  , Disp: , Rfl:   Allergies:  Allergies  Allergen Reactions  . Morphine And Related Anxiety  . Penicillins Rash    Past Medical History, Surgical history, Social history, and Family History were reviewed and updated.  Review of Systems: Constitutional:  Negative for fever, chills, night sweats, anorexia, weight loss, pain. Cardiovascular: no chest pain or dyspnea on exertion Respiratory: no cough, shortness of breath, or wheezing Neurological: no TIA or stroke symptoms Dermatological: negative ENT: negative Skin: Negative. Gastrointestinal: no abdominal pain, change in bowel habits, or black or bloody stools Genito-Urinary: no dysuria, trouble voiding, or hematuria Hematological and Lymphatic: negative Breast: negative Musculoskeletal: negative Remaining ROS negative. Physical Exam: Blood pressure 117/74, pulse 100, temperature 97.7 F (36.5 C), temperature source Oral, height 5\' 11"  (1.803 m), weight 223 lb 8 oz (101.379 kg). ECOG: 1 General appearance: alert Head: Normocephalic, without obvious abnormality, atraumatic Neck: no adenopathy, no carotid bruit, no JVD, supple, symmetrical, trachea midline and thyroid not enlarged, symmetric, no tenderness/mass/nodules Lymph nodes: Cervical, supraclavicular, and axillary nodes normal. Heart:regular rate and rhythm, S1, S2 normal, no murmur, click, rub or gallop Lung:chest clear, no wheezing, rales, normal symmetric air entry Abdomin: soft, non-tender, without masses or organomegaly EXT:no erythema, induration, or nodules   Lab Results: Lab Results  Component Value Date   WBC 8.2 07/07/2011   HGB 12.4* 07/07/2011   HCT 37.0* 07/07/2011   MCV 85.1 07/07/2011   PLT 342 07/07/2011     Chemistry  Component Value Date/Time   NA 137 07/07/2011 1037   K 4.3 07/07/2011 1037   CL 103 07/07/2011 1037   CO2 24 07/07/2011 1037   BUN  17 07/07/2011 1037   CREATININE 1.18 07/07/2011 1037      Component Value Date/Time   CALCIUM 8.7 07/07/2011 1037   ALKPHOS 58 07/07/2011 1037   AST 14 07/07/2011 1037   ALT 8 07/07/2011 1037   BILITOT 0.6 07/07/2011 1037     PET scan on 07/07/2011.  Comparison: CT on 06/30/2011  Findings: The left upper and lower lobe pulmonary nodules seen on  recent CT show intense hypermetabolic activity, with maximum SUV  these measuring 5.1 and 16.7 cm respectively. The posterior left  upper lobe nodule has increased in size from 7 mm previously to 13  mm on today's exam, and the left lower lobe nodule has increased in  size of 11 mm to 18 mm on today's study. No hypermetabolic  adenopathy is seen within the thorax.  No hypermetabolic masses or lymphadenopathy are identified within  the neck or abdomen.  In the pelvis, there is a hypermetabolic activity in the bladder  base surrounding a Foley catheter, which is felt to be due to urine  activity. No hypermetabolic lymphadenopathy or other soft tissue  masses identified within the pelvis.  IMPRESSION:  1. Enlarging hypermetabolic pulmonary nodules in the left upper  and lower lobes, suspicious for metastatic disease. Synchronous  bronchogenic carcinoma cannot definitely be excluded.  2. No other signs of malignancy identified.   Impression and Plan:This is a pleasant 77 year old gentleman with the  following issues:  1. 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 He had a documented invasive tumor of the left renal pelvis, status post left nephroureterectomy, now with recurrence into the bladder neck area, biopsy proven to be muscle invasive. He has also had imaging studies that showed possible lymphadenopathy, as well as lung nodules. PET scan showed positive FDG uptake for malignancy. This is likely represent stage IV disease. Risks and benefits of chemotherapy discussed today in details: given his age and performance  status I have elected to use Gemzar/Carboplatin combination. Toxicities include myelosuppression, Nausea, vomiting, hair loss among other discussed today. He is agreeable to proceed.  He understands the goal would be palliative not curative at this time. I will refer him to chemotherapy education class. I also dicussed with him the possible need for a port-a-cath in the future. He wants to start ASAP, we plan to do that very soon. I have also  Given him a Rx for Zofran for nausea prophylaxis.   2. History of prostate cancer: not active at this point.  3. Follow up: Next week with the start of chemotherapy.      Eli Hose, MD 12/11/20124:48 PM

## 2011-07-28 ENCOUNTER — Other Ambulatory Visit: Payer: Self-pay | Admitting: *Deleted

## 2011-07-28 ENCOUNTER — Other Ambulatory Visit: Payer: Self-pay | Admitting: Oncology

## 2011-07-28 ENCOUNTER — Encounter: Payer: Self-pay | Admitting: *Deleted

## 2011-07-28 DIAGNOSIS — C801 Malignant (primary) neoplasm, unspecified: Secondary | ICD-10-CM

## 2011-07-28 MED ORDER — ONDANSETRON HCL 8 MG PO TABS: ORAL_TABLET | ORAL | Status: DC

## 2011-08-02 ENCOUNTER — Other Ambulatory Visit: Payer: Medicare Other

## 2011-08-05 ENCOUNTER — Ambulatory Visit (HOSPITAL_BASED_OUTPATIENT_CLINIC_OR_DEPARTMENT_OTHER): Payer: Medicare Other

## 2011-08-05 ENCOUNTER — Other Ambulatory Visit (HOSPITAL_BASED_OUTPATIENT_CLINIC_OR_DEPARTMENT_OTHER): Payer: Medicare Other | Admitting: Lab

## 2011-08-05 ENCOUNTER — Ambulatory Visit (HOSPITAL_BASED_OUTPATIENT_CLINIC_OR_DEPARTMENT_OTHER): Payer: Medicare Other | Admitting: Oncology

## 2011-08-05 VITALS — BP 127/69 | HR 96 | Temp 97.3°F | Ht 71.0 in | Wt 221.5 lb

## 2011-08-05 DIAGNOSIS — C7919 Secondary malignant neoplasm of other urinary organs: Secondary | ICD-10-CM

## 2011-08-05 DIAGNOSIS — C679 Malignant neoplasm of bladder, unspecified: Secondary | ICD-10-CM

## 2011-08-05 DIAGNOSIS — C7800 Secondary malignant neoplasm of unspecified lung: Secondary | ICD-10-CM

## 2011-08-05 DIAGNOSIS — C801 Malignant (primary) neoplasm, unspecified: Secondary | ICD-10-CM

## 2011-08-05 DIAGNOSIS — C50919 Malignant neoplasm of unspecified site of unspecified female breast: Secondary | ICD-10-CM

## 2011-08-05 DIAGNOSIS — Z5111 Encounter for antineoplastic chemotherapy: Secondary | ICD-10-CM

## 2011-08-05 LAB — COMPREHENSIVE METABOLIC PANEL
AST: 14 U/L (ref 0–37)
Alkaline Phosphatase: 60 U/L (ref 39–117)
BUN: 15 mg/dL (ref 6–23)
Calcium: 9.3 mg/dL (ref 8.4–10.5)
Chloride: 103 mEq/L (ref 96–112)
Creatinine, Ser: 1.16 mg/dL (ref 0.50–1.35)

## 2011-08-05 LAB — CBC WITH DIFFERENTIAL/PLATELET
BASO%: 1 % (ref 0.0–2.0)
EOS%: 3.3 % (ref 0.0–7.0)
LYMPH%: 18.9 % (ref 14.0–49.0)
MCHC: 32.3 g/dL (ref 32.0–36.0)
MCV: 84.5 fL (ref 79.3–98.0)
MONO%: 9.3 % (ref 0.0–14.0)
Platelets: 311 10*3/uL (ref 140–400)
RBC: 4.44 10*6/uL (ref 4.20–5.82)
nRBC: 0 % (ref 0–0)

## 2011-08-05 MED ORDER — SODIUM CHLORIDE 0.9 % IV SOLN
471.0000 mg | Freq: Once | INTRAVENOUS | Status: AC
  Administered 2011-08-05: 470 mg via INTRAVENOUS
  Filled 2011-08-05: qty 47

## 2011-08-05 MED ORDER — ONDANSETRON 16 MG/50ML IVPB (CHCC)
16.0000 mg | Freq: Once | INTRAVENOUS | Status: AC
  Administered 2011-08-05: 16 mg via INTRAVENOUS

## 2011-08-05 MED ORDER — SODIUM CHLORIDE 0.9 % IV SOLN
Freq: Once | INTRAVENOUS | Status: AC
  Administered 2011-08-05: 13:00:00 via INTRAVENOUS

## 2011-08-05 MED ORDER — SODIUM CHLORIDE 0.9 % IV SOLN
1000.0000 mg/m2 | Freq: Once | INTRAVENOUS | Status: AC
  Administered 2011-08-05: 2242 mg via INTRAVENOUS
  Filled 2011-08-05: qty 59

## 2011-08-05 MED ORDER — DEXAMETHASONE SODIUM PHOSPHATE 4 MG/ML IJ SOLN
20.0000 mg | Freq: Once | INTRAMUSCULAR | Status: AC
  Administered 2011-08-05: 20 mg via INTRAVENOUS

## 2011-08-05 NOTE — Progress Notes (Signed)
D/C home at 1520 with wife in good condition. Tolerated treatment without adverse event. Ambulatory upon discharge.

## 2011-08-05 NOTE — Patient Instructions (Addendum)
[facility] Discharge Instructions for Patients Receiving Chemotherapy  Today you received the following chemotherapy agents : Gemzar and Carboplatin  To help prevent nausea and vomiting after your treatment, we encourage you to take your nausea medication. Start Zofran 8 mg twice daily X 3 days as directed by MD . Decadron 8 mg twice daily for 3 days as directed by MD May continue Zofran 8 mg twice daily as needed for nausea. Fill script for Compazine and take every 6 hours if needed.  Avoid any heavy,spicy meals tonight. Eat small frequent meals for nausea prevention. Push po fluids (try to limit caffeine intake).    If you develop nausea and vomiting that is not controlled by your nausea medication, call the clinic. If it is after clinic hours your family physician or the after hours number for the clinic or go to the Emergency Department.   BELOW ARE SYMPTOMS THAT SHOULD BE REPORTED IMMEDIATELY:  *FEVER GREATER THAN 100.5 F  *CHILLS WITH OR WITHOUT FEVER  NAUSEA AND VOMITING THAT IS NOT CONTROLLED WITH YOUR NAUSEA MEDICATION  *UNUSUAL SHORTNESS OF BREATH  *UNUSUAL BRUISING OR BLEEDING  TENDERNESS IN MOUTH AND THROAT WITH OR WITHOUT PRESENCE OF ULCERS  *URINARY PROBLEMS  *BOWEL PROBLEMS  UNUSUAL RASH Items with * indicate a potential emergency and should be followed up as soon as possible.  One of the nurses will contact you 24 hours after your treatment. Please let the nurse know about any problems that you may have experienced. Feel free to call the clinic you have any questions or concerns. The clinic phone number is [phone].   I have been informed and understand all the instructions given to me. I know to contact the clinic, my physician, or go to the Emergency Department if any problems should occur. I do not have any questions at this time, but understand that I may call the clinic during office hours   should I have any questions or need  assistance in obtaining follow up care.    __________________________________________  _____________  __________ Signature of Patient or Authorized Representative            Date                   Time    __________________________________________ Nurse's Signature

## 2011-08-05 NOTE — Progress Notes (Signed)
Hematology and Oncology Follow Up Visit  [name] [id number] 1927/04/30 77 y.o. 08/05/2011 11:58 AM   CC: Brian Purpura, MD  Brian Kindle, MD   Principle Diagnosis: This is a pleasant 77 year old gentleman with the following issues:  1. 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 Now has metastatic disease with lung nodules. 2. Prostate cancer: He is s/p treatment with brachytherapy in December 2002. He has had no evidence for cancer recurrence since treatment.    Prior Therapy: He underwent an open left nephroureterectomy done on June 04, 2010. His pathology from that showed [phone], showed that he had a papillary invasive high-grade urothelial carcinoma with negative urinary bladder cuff margins. The tumor was 3.8 cm. No lymphovascular invasion. Again, was a T2 NX disease.   Current therapy: Patient to start day 1, cycle 1 of chemotherapy of Carboplatin and Gemzar.  Interim History: [name] presents for a follow up visit since his last visit on 11/21. He is really asymptomatic. He did not report any abdominal pain. He had not reported any chest pain. Had not reported any hematuria. Had not reported any flank pain. Had not reported really any major changes in his performance status or activity level at this time. He has continued to perform activities of daily living without any hindrance or decline. He has continued to drive and attends to again activities of daily living without problems at this point. He still has a foley catheter in place. He is ready to proceed with his first cycle of chemotherapy. No complications since his last visit.   Medications: I have reviewed the patient's current medications. Current outpatient prescriptions:acetaminophen (TYLENOL) 500 MG tablet, Take 500-1,000 mg by mouth every 6 (six) hours as needed.  , Disp: , Rfl: ;  amLODipine (NORVASC) 10 MG tablet,  Take 10 mg by mouth every morning.  , Disp: , Rfl: ;  dexamethasone (DECADRON) 4 MG tablet, Take 2 tablets by mouth two times a day starting the day after chemotherapy for 3 days., Disp: 30 tablet, Rfl: 1 finasteride (PROSCAR) 5 MG tablet, Take 5 mg by mouth daily after lunch daily after lunch.  , Disp: , Rfl: ;  LORazepam (ATIVAN) 0.5 MG tablet, Take 1 tablet (0.5 mg total) by mouth every 6 (six) hours as needed (Nausea or vomiting)., Disp: 30 tablet, Rfl: 0 ondansetron (ZOFRAN) 8 MG tablet, Take 1 tab two times a day starting the day after chemo for 3 days. Then take 1 tab two times a day as needed for nausea or vomiting. , Disp: 30 tablet, Rfl: 1;  prochlorperazine (COMPAZINE) 10 MG tablet, Take 1 tablet (10 mg total) by mouth every 6 (six) hours as needed (Nausea or vomiting)., Disp: 30 tablet, Rfl: 1 prochlorperazine (COMPAZINE) 25 MG suppository, Place 1 suppository (25 mg total) rectally every 12 (twelve) hours as needed for nausea., Disp: 12 suppository, Rfl: 3;  simvastatin (ZOCOR) 80 MG tablet, Take 40 mg by mouth at bedtime.  , Disp: , Rfl:   Allergies:  Allergies  Allergen Reactions  . Morphine And Related Anxiety  . Penicillins Rash    Past Medical History, Surgical history, Social history, and Family History were reviewed and updated.  Review of Systems: Constitutional:  Negative for fever, chills, night sweats, anorexia, weight loss, pain. Cardiovascular: no chest pain or dyspnea on exertion Respiratory: no cough, shortness of breath, or wheezing Neurological: no TIA or stroke symptoms Dermatological: negative ENT:  negative Skin: Negative. Gastrointestinal: no abdominal pain, change in bowel habits, or black or bloody stools Genito-Urinary: no dysuria, trouble voiding, or hematuria Hematological and Lymphatic: negative Breast: negative Musculoskeletal: negative Remaining ROS negative. Physical Exam: Blood pressure 127/69, pulse 96, temperature 97.3 F (36.3 C),  temperature source Oral, height 5\' 11"  (1.803 m), weight 221 lb 8 oz (100.472 kg). ECOG: 1 General appearance: alert Head: Normocephalic, without obvious abnormality, atraumatic Neck: no adenopathy, no carotid bruit, no JVD, supple, symmetrical, trachea midline and thyroid not enlarged, symmetric, no tenderness/mass/nodules Lymph nodes: Cervical, supraclavicular, and axillary nodes normal. Heart:regular rate and rhythm, S1, S2 normal, no murmur, click, rub or gallop Lung:chest clear, no wheezing, rales, normal symmetric air entry Abdomin: soft, non-tender, without masses or organomegaly EXT:no erythema, induration, or nodules   Lab Results: Lab Results  Component Value Date   WBC 8.8 08/05/2011   HGB 12.1* 08/05/2011   HCT 37.5* 08/05/2011   MCV 84.5 08/05/2011   PLT 311 08/05/2011     Chemistry      Component Value Date/Time   NA 137 07/07/2011 1037   K 4.3 07/07/2011 1037   CL 103 07/07/2011 1037   CO2 24 07/07/2011 1037   BUN 17 07/07/2011 1037   CREATININE 1.18 07/07/2011 1037      Component Value Date/Time   CALCIUM 8.7 07/07/2011 1037   ALKPHOS 58 07/07/2011 1037   AST 14 07/07/2011 1037   ALT 8 07/07/2011 1037   BILITOT 0.6 07/07/2011 1037     PET scan on 07/07/2011.  Comparison: CT on 06/30/2011  Findings: The left upper and lower lobe pulmonary nodules seen on  recent CT show intense hypermetabolic activity, with maximum SUV  these measuring 5.1 and 16.7 cm respectively. The posterior left  upper lobe nodule has increased in size from 7 mm previously to 13  mm on today's exam, and the left lower lobe nodule has increased in  size of 11 mm to 18 mm on today's study. No hypermetabolic  adenopathy is seen within the thorax.  No hypermetabolic masses or lymphadenopathy are identified within  the neck or abdomen.  In the pelvis, there is a hypermetabolic activity in the bladder  base surrounding a Foley catheter, which is felt to be due to urine  activity. No  hypermetabolic lymphadenopathy or other soft tissue  masses identified within the pelvis.  IMPRESSION:  1. Enlarging hypermetabolic pulmonary nodules in the left upper  and lower lobes, suspicious for metastatic disease. Synchronous  bronchogenic carcinoma cannot definitely be excluded.  2. No other signs of malignancy identified.   Impression and Plan:This is a pleasant 76 year old gentleman with the  following issues:  1. 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 He had a documented invasive tumor of the left renal pelvis, status post left nephroureterectomy, now with recurrence into the bladder neck area, biopsy proven to be muscle invasive. He has also had imaging studies that showed possible lymphadenopathy, as well as lung nodules. PET scan showed positive FDG uptake for malignancy. This is likely represent stage IV disease.  He is ready to proceed with first cycle of chemotherapy. All of his questions are answered today. He has Zofran incase of nausea.  2. History of prostate cancer: not active at this point.  3. Follow up: He will receive day 8 of gemzar on 08/12/2011. I will see him on day one of cycle 2 on 08/26/2011.      Dawan Farney, MD 12/20/201211:58 AM

## 2011-08-06 ENCOUNTER — Telehealth: Payer: Self-pay | Admitting: *Deleted

## 2011-08-06 NOTE — Telephone Encounter (Signed)
Patient having no adverse reaction to his treatment. Taking antiemetic as directed. Able to eat regular diet, bowels moving, energy is normal. No pain. Knows to call if he has any problems. Reports some sinus congestion and asking if OK to take OTC sinus med. Told him OK to take whatever OTC sinus med has worked well for him in the past.

## 2011-08-06 NOTE — Telephone Encounter (Signed)
Left VM for patient to return call to triage nurse with status update.

## 2011-08-12 ENCOUNTER — Ambulatory Visit: Payer: Medicare Other

## 2011-08-12 ENCOUNTER — Ambulatory Visit (HOSPITAL_BASED_OUTPATIENT_CLINIC_OR_DEPARTMENT_OTHER): Payer: Medicare Other

## 2011-08-12 ENCOUNTER — Other Ambulatory Visit (HOSPITAL_BASED_OUTPATIENT_CLINIC_OR_DEPARTMENT_OTHER): Payer: Medicare Other | Admitting: Lab

## 2011-08-12 VITALS — BP 119/80 | HR 93 | Temp 97.9°F

## 2011-08-12 DIAGNOSIS — C801 Malignant (primary) neoplasm, unspecified: Secondary | ICD-10-CM

## 2011-08-12 DIAGNOSIS — C679 Malignant neoplasm of bladder, unspecified: Secondary | ICD-10-CM

## 2011-08-12 DIAGNOSIS — Z5111 Encounter for antineoplastic chemotherapy: Secondary | ICD-10-CM

## 2011-08-12 LAB — CBC WITH DIFFERENTIAL/PLATELET
Basophils Absolute: 0.1 10*3/uL (ref 0.0–0.1)
EOS%: 1.4 % (ref 0.0–7.0)
Eosinophils Absolute: 0.1 10*3/uL (ref 0.0–0.5)
HCT: 35.4 % — ABNORMAL LOW (ref 38.4–49.9)
HGB: 11.6 g/dL — ABNORMAL LOW (ref 13.0–17.1)
MCH: 27 pg — ABNORMAL LOW (ref 27.2–33.4)
NEUT%: 64.2 % (ref 39.0–75.0)
lymph#: 1.1 10*3/uL (ref 0.9–3.3)

## 2011-08-12 LAB — COMPREHENSIVE METABOLIC PANEL
Albumin: 3.6 g/dL (ref 3.5–5.2)
BUN: 23 mg/dL (ref 6–23)
CO2: 24 mEq/L (ref 19–32)
Calcium: 8.6 mg/dL (ref 8.4–10.5)
Chloride: 102 mEq/L (ref 96–112)
Glucose, Bld: 100 mg/dL — ABNORMAL HIGH (ref 70–99)
Potassium: 4.7 mEq/L (ref 3.5–5.3)

## 2011-08-12 MED ORDER — SODIUM CHLORIDE 0.9 % IV SOLN
1000.0000 mg/m2 | Freq: Once | INTRAVENOUS | Status: AC
  Administered 2011-08-12: 2242 mg via INTRAVENOUS
  Filled 2011-08-12: qty 59

## 2011-08-12 MED ORDER — SODIUM CHLORIDE 0.9 % IV SOLN
Freq: Once | INTRAVENOUS | Status: AC
  Administered 2011-08-12: 10:00:00 via INTRAVENOUS

## 2011-08-12 MED ORDER — PROCHLORPERAZINE MALEATE 10 MG PO TABS
10.0000 mg | ORAL_TABLET | Freq: Once | ORAL | Status: AC
  Administered 2011-08-12: 10 mg via ORAL

## 2011-08-12 NOTE — Patient Instructions (Addendum)
08/12/11-1120- Pt discharged ambulatory with next appointment confirmed.  Pt aware to call with any questions or concerns.

## 2011-08-15 ENCOUNTER — Telehealth: Payer: Self-pay | Admitting: Oncology

## 2011-08-15 ENCOUNTER — Encounter (HOSPITAL_COMMUNITY): Payer: Self-pay

## 2011-08-15 ENCOUNTER — Emergency Department (HOSPITAL_COMMUNITY): Payer: Medicare Other

## 2011-08-15 ENCOUNTER — Inpatient Hospital Stay (HOSPITAL_COMMUNITY)
Admission: EM | Admit: 2011-08-15 | Discharge: 2011-08-20 | DRG: 698 | Disposition: A | Discharge status: Home / Self Care | Payer: Medicare Other | Attending: Internal Medicine | Admitting: Internal Medicine

## 2011-08-15 DIAGNOSIS — T451X5A Adverse effect of antineoplastic and immunosuppressive drugs, initial encounter: Secondary | ICD-10-CM | POA: Diagnosis present

## 2011-08-15 DIAGNOSIS — R509 Fever, unspecified: Secondary | ICD-10-CM | POA: Diagnosis present

## 2011-08-15 DIAGNOSIS — D649 Anemia, unspecified: Secondary | ICD-10-CM

## 2011-08-15 DIAGNOSIS — I10 Essential (primary) hypertension: Secondary | ICD-10-CM

## 2011-08-15 DIAGNOSIS — N390 Urinary tract infection, site not specified: Secondary | ICD-10-CM | POA: Diagnosis present

## 2011-08-15 DIAGNOSIS — Z905 Acquired absence of kidney: Secondary | ICD-10-CM

## 2011-08-15 DIAGNOSIS — T83511A Infection and inflammatory reaction due to indwelling urethral catheter, initial encounter: Secondary | ICD-10-CM | POA: Diagnosis present

## 2011-08-15 DIAGNOSIS — E785 Hyperlipidemia, unspecified: Secondary | ICD-10-CM | POA: Diagnosis present

## 2011-08-15 DIAGNOSIS — D709 Neutropenia, unspecified: Secondary | ICD-10-CM | POA: Diagnosis present

## 2011-08-15 DIAGNOSIS — Z87440 Personal history of urinary (tract) infections: Secondary | ICD-10-CM

## 2011-08-15 DIAGNOSIS — N183 Chronic kidney disease, stage 3 unspecified: Secondary | ICD-10-CM | POA: Diagnosis present

## 2011-08-15 DIAGNOSIS — C7800 Secondary malignant neoplasm of unspecified lung: Secondary | ICD-10-CM | POA: Diagnosis present

## 2011-08-15 DIAGNOSIS — I129 Hypertensive chronic kidney disease with stage 1 through stage 4 chronic kidney disease, or unspecified chronic kidney disease: Secondary | ICD-10-CM | POA: Diagnosis present

## 2011-08-15 DIAGNOSIS — Y846 Urinary catheterization as the cause of abnormal reaction of the patient, or of later complication, without mention of misadventure at the time of the procedure: Secondary | ICD-10-CM | POA: Diagnosis present

## 2011-08-15 DIAGNOSIS — D696 Thrombocytopenia, unspecified: Secondary | ICD-10-CM | POA: Diagnosis present

## 2011-08-15 DIAGNOSIS — C659 Malignant neoplasm of unspecified renal pelvis: Secondary | ICD-10-CM | POA: Diagnosis present

## 2011-08-15 DIAGNOSIS — C801 Malignant (primary) neoplasm, unspecified: Secondary | ICD-10-CM

## 2011-08-15 DIAGNOSIS — C649 Malignant neoplasm of unspecified kidney, except renal pelvis: Secondary | ICD-10-CM | POA: Diagnosis present

## 2011-08-15 DIAGNOSIS — Z8546 Personal history of malignant neoplasm of prostate: Secondary | ICD-10-CM

## 2011-08-15 DIAGNOSIS — L259 Unspecified contact dermatitis, unspecified cause: Secondary | ICD-10-CM | POA: Diagnosis present

## 2011-08-15 DIAGNOSIS — N189 Chronic kidney disease, unspecified: Secondary | ICD-10-CM | POA: Diagnosis present

## 2011-08-15 HISTORY — DX: Anemia, unspecified: D64.9

## 2011-08-15 MED ORDER — SODIUM CHLORIDE 0.9 % IV BOLUS (SEPSIS)
1000.0000 mL | Freq: Once | INTRAVENOUS | Status: AC
  Administered 2011-08-16 (×2): 1000 mL via INTRAVENOUS

## 2011-08-15 MED ORDER — ACETAMINOPHEN 325 MG PO TABS
650.0000 mg | ORAL_TABLET | Freq: Once | ORAL | Status: AC
  Administered 2011-08-16: 650 mg via ORAL
  Filled 2011-08-15: qty 2

## 2011-08-15 NOTE — Telephone Encounter (Signed)
On call: daughter Remer Macho ([phone]) called as patient has temp >101, tho feeling ok and no localizing symptoms. Not neutropenic 12-27, but is post first cycle of carbo/gemzar. Recommended eval at ED including repeat blood counts. Daughter to take him to [facility].

## 2011-08-15 NOTE — ED Notes (Signed)
fever 101.6 oral this afternoon. Called oncologist was told to come here for evaluation.  Pt current temp is 99.6 oral with no other complaints

## 2011-08-16 ENCOUNTER — Encounter (HOSPITAL_COMMUNITY): Payer: Self-pay | Admitting: Family Medicine

## 2011-08-16 DIAGNOSIS — T83511A Infection and inflammatory reaction due to indwelling urethral catheter, initial encounter: Secondary | ICD-10-CM | POA: Diagnosis present

## 2011-08-16 DIAGNOSIS — C649 Malignant neoplasm of unspecified kidney, except renal pelvis: Secondary | ICD-10-CM | POA: Diagnosis present

## 2011-08-16 DIAGNOSIS — D696 Thrombocytopenia, unspecified: Secondary | ICD-10-CM | POA: Diagnosis present

## 2011-08-16 DIAGNOSIS — E785 Hyperlipidemia, unspecified: Secondary | ICD-10-CM | POA: Diagnosis present

## 2011-08-16 DIAGNOSIS — D709 Neutropenia, unspecified: Secondary | ICD-10-CM | POA: Diagnosis present

## 2011-08-16 DIAGNOSIS — I10 Essential (primary) hypertension: Secondary | ICD-10-CM

## 2011-08-16 DIAGNOSIS — C7800 Secondary malignant neoplasm of unspecified lung: Secondary | ICD-10-CM | POA: Diagnosis present

## 2011-08-16 DIAGNOSIS — D649 Anemia, unspecified: Secondary | ICD-10-CM

## 2011-08-16 DIAGNOSIS — R509 Fever, unspecified: Secondary | ICD-10-CM | POA: Diagnosis present

## 2011-08-16 DIAGNOSIS — Z8546 Personal history of malignant neoplasm of prostate: Secondary | ICD-10-CM

## 2011-08-16 DIAGNOSIS — N189 Chronic kidney disease, unspecified: Secondary | ICD-10-CM | POA: Diagnosis present

## 2011-08-16 HISTORY — DX: Anemia, unspecified: D64.9

## 2011-08-16 LAB — PROCALCITONIN: Procalcitonin: <0.1 ng/mL

## 2011-08-16 LAB — POCT I-STAT, CHEM 8
Calcium, Ion: 1.11 mmol/L — ABNORMAL LOW (ref 1.12–1.32)
Hemoglobin: 9.2 g/dL — ABNORMAL LOW (ref 13.0–17.0)
Sodium: 137 mEq/L (ref 135–145)
TCO2: 23 mmol/L (ref 0–100)

## 2011-08-16 LAB — URINE MICROSCOPIC-ADD ON

## 2011-08-16 LAB — COMPREHENSIVE METABOLIC PANEL
AST: 18 U/L (ref 0–37)
Albumin: 2.9 g/dL — ABNORMAL LOW (ref 3.5–5.2)
Alkaline Phosphatase: 60 U/L (ref 39–117)
Chloride: 101 mEq/L (ref 96–112)
Potassium: 3.9 mEq/L (ref 3.5–5.1)
Total Bilirubin: 0.3 mg/dL (ref 0.3–1.2)

## 2011-08-16 LAB — URINALYSIS, ROUTINE W REFLEX MICROSCOPIC
Bilirubin Urine: NEGATIVE
Nitrite: NEGATIVE
Specific Gravity, Urine: 1.025 (ref 1.005–1.030)
pH: 5.5 (ref 5.0–8.0)

## 2011-08-16 LAB — DIFFERENTIAL
Basophils Relative: 0 % (ref 0–1)
Eosinophils Relative: 1 % (ref 0–5)
Lymphocytes Relative: 24 % (ref 12–46)
Neutrophils Relative %: 75 % (ref 43–77)

## 2011-08-16 LAB — CBC
Hemoglobin: 9.5 g/dL — ABNORMAL LOW (ref 13.0–17.0)
RBC: 3.46 MIL/uL — ABNORMAL LOW (ref 4.22–5.81)

## 2011-08-16 MED ORDER — FINASTERIDE 5 MG PO TABS
5.0000 mg | ORAL_TABLET | Freq: Every day | ORAL | Status: DC
  Administered 2011-08-16 – 2011-08-20 (×5): 5 mg via ORAL
  Filled 2011-08-16 (×5): qty 1

## 2011-08-16 MED ORDER — DIPHENHYDRAMINE HCL 25 MG PO CAPS
25.0000 mg | ORAL_CAPSULE | Freq: Four times a day (QID) | ORAL | Status: DC
  Administered 2011-08-16 – 2011-08-18 (×6): 25 mg via ORAL
  Filled 2011-08-16 (×12): qty 1

## 2011-08-16 MED ORDER — SODIUM CHLORIDE 0.9 % IV SOLN
INTRAVENOUS | Status: DC
  Administered 2011-08-16 (×3): via INTRAVENOUS
  Administered 2011-08-17: 1000 mL via INTRAVENOUS
  Administered 2011-08-18 – 2011-08-19 (×2): via INTRAVENOUS

## 2011-08-16 MED ORDER — VANCOMYCIN HCL 1000 MG IV SOLR
750.0000 mg | Freq: Two times a day (BID) | INTRAVENOUS | Status: DC
  Administered 2011-08-16 (×2): 750 mg via INTRAVENOUS
  Filled 2011-08-16 (×5): qty 750

## 2011-08-16 MED ORDER — SIMVASTATIN 40 MG PO TABS: 40.0000 mg | ORAL_TABLET | Freq: Every day | ORAL | Status: DC

## 2011-08-16 MED ORDER — AMLODIPINE BESYLATE 10 MG PO TABS
10.0000 mg | ORAL_TABLET | Freq: Every day | ORAL | Status: DC
  Administered 2011-08-16: 10 mg via ORAL
  Filled 2011-08-16 (×2): qty 1

## 2011-08-16 MED ORDER — CIPROFLOXACIN IN D5W 400 MG/200ML IV SOLN
400.0000 mg | Freq: Two times a day (BID) | INTRAVENOUS | Status: DC
  Administered 2011-08-16 (×2): 400 mg via INTRAVENOUS
  Filled 2011-08-16 (×4): qty 200

## 2011-08-16 MED ORDER — ROSUVASTATIN CALCIUM 10 MG PO TABS
10.0000 mg | ORAL_TABLET | Freq: Every day | ORAL | Status: DC
  Administered 2011-08-16 – 2011-08-19 (×4): 10 mg via ORAL
  Filled 2011-08-16 (×5): qty 1

## 2011-08-16 MED ORDER — ACETAMINOPHEN 500 MG PO TABS
500.0000 mg | ORAL_TABLET | Freq: Four times a day (QID) | ORAL | Status: DC | PRN
  Administered 2011-08-16 – 2011-08-18 (×3): 500 mg via ORAL
  Filled 2011-08-16 (×3): qty 1

## 2011-08-16 MED ORDER — PROCHLORPERAZINE MALEATE 10 MG PO TABS
10.0000 mg | ORAL_TABLET | Freq: Four times a day (QID) | ORAL | Status: DC | PRN
  Filled 2011-08-16: qty 1

## 2011-08-16 MED ORDER — CIPROFLOXACIN IN D5W 400 MG/200ML IV SOLN
400.0000 mg | Freq: Once | INTRAVENOUS | Status: AC
  Administered 2011-08-16: 400 mg via INTRAVENOUS
  Filled 2011-08-16: qty 200

## 2011-08-16 MED ORDER — HYDROCORTISONE 1 % EX CREA
TOPICAL_CREAM | Freq: Two times a day (BID) | CUTANEOUS | Status: DC
  Administered 2011-08-16 – 2011-08-19 (×6): via TOPICAL
  Filled 2011-08-16: qty 28

## 2011-08-16 MED ORDER — LORAZEPAM 0.5 MG PO TABS
0.5000 mg | ORAL_TABLET | Freq: Four times a day (QID) | ORAL | Status: DC | PRN
  Administered 2011-08-16: 0.5 mg via ORAL
  Filled 2011-08-16: qty 1

## 2011-08-16 NOTE — H&P (Signed)
History and Physical Examination  Date: 08/16/2011  Patient name: [name] Medical record number: [id number] Date of birth: 18-Apr-1927 Age: 77 y.o. Gender: male PCP: Ginette Otto, MD, MD  Attending physician: Cristal Ford  Chief Complaint: Fever  History of Present Illness: [name] is an 77 y.o. 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 Now has metastatic disease with lung nodules. He has a history of Prostate cancer: He is s/p treatment with brachytherapy in December 2002. He has had no evidence for prostate cancer recurrence since treatment.  He underwent an open left nephroureterectomy done on June 04, 2010. His pathology from that showed [phone], showed that he had a papillary invasive high-grade urothelial carcinoma with negative urinary bladder cuff margins. The tumor was 3.8 cm. No lymphovascular invasion. Again, was a T2 NX disease.  Current therapy: Patient to started cycle 1 of chemotherapy of Carboplatin and Gemzar.  Pt was sent  To the emergency department by his oncologist after patient reported persistent elevated temperature greater than 101.  The patient had chemotherapy treatment last week.  His last blood count was tested on December 27 prior to presenting to the emergency department today.  The patient reports that he has had his indwelling catheter in for approximately 6 weeks.  He has a history of difficult to treat urinary tract infections.  He denies cough, headache, neck pain, rash, visual changes, nausea vomiting and diarrhea.  He denies constipation.  In the emergency department he was evaluated and found to have an elevated white blood cell count and bacteria in the urine concerning for significant infection.  Hospital admission was requested for patient to be monitored and treated for the urinary tract infection with IV antibiotics.  Urine culture  has been requested.  The patient is immunocompromised because of receiving chemotherapy at this time.    Past Medical History  Diagnosis Date  . Hypertension   . Chronic kidney disease 06/15/11    bladder cancer  . Blood transfusion   . Arthritis   . Cancer     bladder cancer, hx prostate cancer  . Anemia 08/16/2011    Home Meds: Prior to Admission medications   Medication Sig Start Date End Date Taking? Authorizing Provider  acetaminophen (TYLENOL) 500 MG tablet Take 500-1,000 mg by mouth every 6 (six) hours as needed.     Yes Historical Provider, MD  amLODipine (NORVASC) 10 MG tablet Take 10 mg by mouth every morning.     Yes Historical Provider, MD  dexamethasone (DECADRON) 4 MG tablet Take 2 tablets by mouth two times a day starting the day after chemotherapy for 3 days. 07/27/11 07/26/12 Yes Eli Hose, MD  finasteride (PROSCAR) 5 MG tablet Take 5 mg by mouth daily after lunch daily after lunch.     Yes Historical Provider, MD  LORazepam (ATIVAN) 0.5 MG tablet Take 1 tablet (0.5 mg total) by mouth every 6 (six) hours as needed (Nausea or vomiting). 07/27/11 01/23/12 Yes Eli Hose, MD  ondansetron (ZOFRAN) 8 MG tablet Take 1 tab two times a day starting the day after chemo for 3 days. Then take 1 tab two times a day as needed for nausea or vomiting.  07/28/11 07/27/12 Yes Eli Hose, MD  prochlorperazine (COMPAZINE) 10 MG tablet Take 1 tablet (10 mg total) by mouth every 6 (six) hours as needed (Nausea or vomiting). 07/27/11 07/26/12 Yes Eli Hose, MD  prochlorperazine (COMPAZINE) 25 MG suppository Place 1 suppository (25 mg total) rectally every 12 (twelve) hours as needed for nausea. 07/27/11 07/26/12 Yes Eli Hose, MD  simvastatin (ZOCOR) 80 MG tablet Take 40 mg by mouth at bedtime.     Yes Historical Provider, MD    Allergies: Morphine and related and Penicillins  Social History:  History   Social History  . Marital Status: Married    Spouse Name: N/A     Number of Children: N/A  . Years of Education: N/A   Occupational History  . Not on file.   Social History Main Topics  . Smoking status: Not on file  . Smokeless tobacco: Former Neurosurgeon    Quit date: 08/16/1982  . Alcohol Use: No  . Drug Use:   . Sexually Active:    Other Topics Concern  . Not on file   Social History Narrative  . No narrative on file   Family History: No family history on file. Past Surgical History:  Past Surgical History  Procedure Date  . Colon surgery   . Appendectomy   . Joint replacement   . Vascular surgery     aortic anurysm repair  . Nephrectomy     left  . Arcuate keratectomy   . Bladder surgery     Bx of bladder x4  . Prostate biopsy     Prostate seeds/radiation  . Cystoscopy 06/21/2011    Procedure: CYSTOSCOPY;  Surgeon: Crecencio Mc, MD;  Location: [facility];  Service: Urology;  Laterality: N/A;  . Transurethral resection of bladder tumor 06/21/2011    Procedure: TRANSURETHRAL RESECTION OF BLADDER TUMOR (TURBT);  Surgeon: Crecencio Mc, MD;  Location: [facility];  Service: Urology;  Laterality: N/A;  . Transurethral resection of prostate 06/21/2011    Procedure: TRANSURETHRAL RESECTION OF THE PROSTATE (TURP);  Surgeon: Crecencio Mc, MD;  Location: [facility];  Service: Urology;  Laterality: N/A;  . Cystoscopy with urethral dilatation 06/21/2011    Procedure: CYSTOSCOPY WITH URETHRAL DILATATION;  Surgeon: Crecencio Mc, MD;  Location: [facility];  Service: Urology;  Laterality: N/A;    Review of Systems: A comprehensive review of systems was negative.   Physical Exam: Blood pressure 109/74, pulse 115, temperature 100.9 F (38.3 C), temperature source Oral, resp. rate 20, SpO2 98.00%. BP 109/74  Pulse 115  Temp(Src) 100.9 F (38.3 C) (Oral)  Resp 20  SpO2 98% General appearance: alert, cooperative, appears stated age and no distress Head: Normocephalic, without obvious abnormality, atraumatic Eyes: negative Nose: Nares normal. Septum midline. Mucosa normal.  No drainage or sinus tenderness., no discharge Throat: lips, mucosa, and tongue normal; teeth and gums normal Neck: no adenopathy, no carotid bruit, no JVD, supple, symmetrical, trachea midline and thyroid not enlarged, symmetric, no tenderness/mass/nodules Lungs: clear to auscultation bilaterally Heart: S1, S2 normal Abdomen: soft, non-tender; bowel sounds normal; no masses,  no organomegaly Extremities: extremities normal, atraumatic, no cyanosis or edema Pulses: 2+ and symmetric Neurologic: Grossly normal  Lab  And Imaging results:  Results for orders placed during the hospital encounter of 08/15/11 (from the past 48 hour(s))  URINALYSIS, ROUTINE W REFLEX MICROSCOPIC     Status: Abnormal   Collection Time   08/15/11 11:36 PM      Component Value Range Comment   Color, Urine AMBER (*) YELLOW  BIOCHEMICALS MAY BE AFFECTED BY COLOR   APPearance TURBID (*) CLEAR     Specific Gravity, Urine 1.025  1.005 - 1.030     pH 5.5  5.0 -  8.0     Glucose, UA NEGATIVE  NEGATIVE (mg/dL)    Hgb urine dipstick TRACE (*) NEGATIVE     Bilirubin Urine NEGATIVE  NEGATIVE     Ketones, ur NEGATIVE  NEGATIVE (mg/dL)    Protein, ur 161 (*) NEGATIVE (mg/dL)    Urobilinogen, UA 0.2  0.0 - 1.0 (mg/dL)    Nitrite NEGATIVE  NEGATIVE     Leukocytes, UA MODERATE (*) NEGATIVE    URINE MICROSCOPIC-ADD ON     Status: Abnormal   Collection Time   08/15/11 11:36 PM      Component Value Range Comment   Squamous Epithelial / LPF FEW (*) RARE     WBC, UA TOO NUMEROUS TO COUNT  <3 (WBC/hpf)    RBC / HPF 11-20  <3 (RBC/hpf)    Bacteria, UA MANY (*) RARE    CBC     Status: Abnormal   Collection Time   08/15/11 11:50 PM      Component Value Range Comment   WBC 3.6 (*) 4.0 - 10.5 (K/uL)    RBC 3.46 (*) 4.22 - 5.81 (MIL/uL)    Hemoglobin 9.5 (*) 13.0 - 17.0 (g/dL)    HCT 09.6 (*) 04.5 - 52.0 (%)    MCV 82.9  78.0 - 100.0 (fL)    MCH 27.5  26.0 - 34.0 (pg)    MCHC 33.1  30.0 - 36.0 (g/dL)    RDW 40.9  81.1 - 91.4  (%)    Platelets 71 (*) 150 - 400 (K/uL) PLATELET CLUMPS NOTED ON SMEAR, COUNT APPEARS DECREASED  DIFFERENTIAL     Status: Abnormal   Collection Time   08/15/11 11:50 PM      Component Value Range Comment   Neutrophils Relative 75  43 - 77 (%)    Lymphocytes Relative 24  12 - 46 (%)    Monocytes Relative 0 (*) 3 - 12 (%)    Eosinophils Relative 1  0 - 5 (%)    Basophils Relative 0  0 - 1 (%)    Neutro Abs 2.7  1.7 - 7.7 (K/uL)    Lymphs Abs 0.9  0.7 - 4.0 (K/uL)    Monocytes Absolute 0.0 (*) 0.1 - 1.0 (K/uL)    Eosinophils Absolute 0.0  0.0 - 0.7 (K/uL)    Basophils Absolute 0.0  0.0 - 0.1 (K/uL)    RBC Morphology OVALOCYTES      WBC Morphology TOXIC GRANULATION   DOHLE BODIES   Smear Review PLATELET COUNT CONFIRMED BY SMEAR   PLATELET CLUMPS NOTED ON SMEAR, COUNT APPEARS DECREASED  COMPREHENSIVE METABOLIC PANEL     Status: Abnormal   Collection Time   08/15/11 11:50 PM      Component Value Range Comment   Sodium 135  135 - 145 (mEq/L)    Potassium 3.9  3.5 - 5.1 (mEq/L)    Chloride 101  96 - 112 (mEq/L)    CO2 25  19 - 32 (mEq/L)    Glucose, Bld 94  70 - 99 (mg/dL)    BUN 20  6 - 23 (mg/dL)    Creatinine, Ser 7.82  0.50 - 1.35 (mg/dL)    Calcium 8.3 (*) 8.4 - 10.5 (mg/dL)    Total Protein 6.1  6.0 - 8.3 (g/dL)    Albumin 2.9 (*) 3.5 - 5.2 (g/dL)    AST 18  0 - 37 (U/L)    ALT 18  0 - 53 (U/L)    Alkaline Phosphatase 60  39 - 117 (U/L)    Total Bilirubin 0.3  0.3 - 1.2 (mg/dL)    GFR calc non Af Amer 57 (*) >90 (mL/min)    GFR calc Af Amer 66 (*) >90 (mL/min)   LACTIC ACID, PLASMA     Status: Normal   Collection Time   08/15/11 11:50 PM      Component Value Range Comment   Lactic Acid, Venous 2.0  0.5 - 2.2 (mmol/L)   PROCALCITONIN     Status: Normal   Collection Time   08/15/11 11:50 PM      Component Value Range Comment   Procalcitonin <0.10      Dg Chest 2 View  08/16/2011  *RADIOLOGY REPORT*  Clinical Data: Fever and weakness  CHEST - 2 VIEW  Comparison:  10/15/2010  Findings: Normal heart size and pulmonary vascularity.  Slight fibrosis in the lung bases.  Mild emphysematous changes.  No focal airspace consolidation.  There is a small focal nodular opacity measuring about 9 mm in the left midlung that was not present on the prior study.  CT is recommended to exclude a significant nodule.  Tortuous aorta.  Degenerative changes in the thoracic spine.  Anterior compression of T9 and L1 vertebrae, stable since previous studies.  IMPRESSION: No evidence of active consolidation in the lungs.  New appearance of 9 mm nodule in the left midlung.  CT recommended to exclude significant nodules.  Original Report Authenticated By: Marlon Pel, M.D.   EKG Results:  Orders placed in visit on 06/21/11  . EKG 12-LEAD     Impression  Active Problems:  Renal cell cancer  Neutropenia  Fever  Indwelling urinary catheter infection or inflammation  Lung metastases  History of prostate cancer  Thrombocytopenia  Anemia  CKD (chronic kidney disease)  HTN (hypertension)  Hyperlipemia UTI  Plan  Patient has been admitted and started on IV ciprofloxacin.  Urine cultures pending at this time.  Recommend placing a new Foley catheter.  Monitor CBC, neutropenia precautions, resume home medications for other chronic medical conditions and oncology and urology consultation later today.  Please see orders.   Kacper Cartlidge 08/16/2011, 2:46 AM [phone]

## 2011-08-16 NOTE — ED Provider Notes (Signed)
History     CSN: [id number]  Arrival date & time 08/15/11  2037   First MD Initiated Contact with Patient 08/15/11 2310      Chief Complaint  Patient presents with  . Fever    (Consider location/radiation/quality/duration/timing/severity/associated sxs/prior treatment) Patient is a 77 y.o. male presenting with fever. The history is provided by the patient.  Fever Primary symptoms of the febrile illness include fever. Primary symptoms do not include headaches, shortness of breath, abdominal pain, dysuria or rash. The current episode started today. This is a new problem.  Associated with: chemo 3 days ago, has indwelling foley. Risk factors for febrile illness include immunodeficiency. temp over 101 at home, no cough, no ABD pain, no rash , no dysuria or change in UOP in his foley. Mod in severity. Called his oncologist who recommended ED evaluation.   Past Medical History  Diagnosis Date  . Hypertension   . Chronic kidney disease 06/15/11    bladder cancer  . Blood transfusion   . Arthritis   . Cancer     bladder cancer, hx prostate cancer  . Anemia 08/16/2011    Past Surgical History  Procedure Date  . Colon surgery   . Appendectomy   . Joint replacement   . Vascular surgery     aortic anurysm repair  . Nephrectomy     left  . Arcuate keratectomy   . Bladder surgery     Bx of bladder x4  . Prostate biopsy     Prostate seeds/radiation  . Cystoscopy 06/21/2011    Procedure: CYSTOSCOPY;  Surgeon: Crecencio Mc, MD;  Location: [facility];  Service: Urology;  Laterality: N/A;  . Transurethral resection of bladder tumor 06/21/2011    Procedure: TRANSURETHRAL RESECTION OF BLADDER TUMOR (TURBT);  Surgeon: Crecencio Mc, MD;  Location: [facility];  Service: Urology;  Laterality: N/A;  . Transurethral resection of prostate 06/21/2011    Procedure: TRANSURETHRAL RESECTION OF THE PROSTATE (TURP);  Surgeon: Crecencio Mc, MD;  Location: [facility];  Service: Urology;  Laterality: N/A;  . Cystoscopy  with urethral dilatation 06/21/2011    Procedure: CYSTOSCOPY WITH URETHRAL DILATATION;  Surgeon: Crecencio Mc, MD;  Location: [facility];  Service: Urology;  Laterality: N/A;    History reviewed. No pertinent family history.  History  Substance Use Topics  . Smoking status: Not on file  . Smokeless tobacco: Former Neurosurgeon    Quit date: 08/16/1982  . Alcohol Use: No      Review of Systems  Constitutional: Positive for fever. Negative for chills.  HENT: Negative for neck pain and neck stiffness.   Eyes: Negative for pain.  Respiratory: Negative for shortness of breath.   Cardiovascular: Negative for chest pain.  Gastrointestinal: Negative for abdominal pain.  Genitourinary: Negative for dysuria.  Musculoskeletal: Negative for back pain.  Skin: Negative for rash.  Neurological: Negative for headaches.  All other systems reviewed and are negative.    Allergies  Morphine and related and Penicillins  Home Medications   Current Outpatient Rx  Name Route Sig Dispense Refill  . ACETAMINOPHEN 500 MG PO TABS Oral Take 500-1,000 mg by mouth every 6 (six) hours as needed.      Marland Kitchen AMLODIPINE BESYLATE 10 MG PO TABS Oral Take 10 mg by mouth every morning.      Marland Kitchen DEXAMETHASONE 4 MG PO TABS  Take 2 tablets by mouth two times a day starting the day after chemotherapy for 3 days. 30 tablet 1  . FINASTERIDE 5  MG PO TABS Oral Take 5 mg by mouth daily after lunch daily after lunch.      Marland Kitchen LORAZEPAM 0.5 MG PO TABS Oral Take 1 tablet (0.5 mg total) by mouth every 6 (six) hours as needed (Nausea or vomiting). 30 tablet 0  . ONDANSETRON HCL 8 MG PO TABS  Take 1 tab two times a day starting the day after chemo for 3 days. Then take 1 tab two times a day as needed for nausea or vomiting.  30 tablet 1    1 every 8 hours  As needed for nausea/vomiting  . PROCHLORPERAZINE MALEATE 10 MG PO TABS Oral Take 1 tablet (10 mg total) by mouth every 6 (six) hours as needed (Nausea or vomiting). 30 tablet 1  .  PROCHLORPERAZINE 25 MG RE SUPP Rectal Place 1 suppository (25 mg total) rectally every 12 (twelve) hours as needed for nausea. 12 suppository 3  . SIMVASTATIN 80 MG PO TABS Oral Take 40 mg by mouth at bedtime.        BP 109/74  Pulse 115  Temp(Src) 100.9 F (38.3 C) (Oral)  Resp 20  SpO2 98%  Physical Exam  Constitutional: He is oriented to person, place, and time. He appears well-developed and well-nourished.  HENT:  Head: Normocephalic and atraumatic.  Eyes: Conjunctivae and EOM are normal. Pupils are equal, round, and reactive to light.  Neck: Trachea normal. Neck supple. No thyromegaly present.  Cardiovascular: Normal rate, regular rhythm, S1 normal, S2 normal and normal pulses.     No systolic murmur is present   No diastolic murmur is present  Pulses:      Radial pulses are 2+ on the right side, and 2+ on the left side.  Pulmonary/Chest: Effort normal and breath sounds normal. He has no wheezes. He has no rhonchi. He has no rales. He exhibits no tenderness.  Abdominal: Soft. Normal appearance and bowel sounds are normal. There is no tenderness. There is no CVA tenderness and negative Murphy's sign.  Musculoskeletal:       BLE:s Calves nontender, no cords or erythema, negative Homans sign  Neurological: He is alert and oriented to person, place, and time. He has normal strength. No cranial nerve deficit or sensory deficit. GCS eye subscore is 4. GCS verbal subscore is 5. GCS motor subscore is 6.  Skin: Skin is warm and dry. No rash noted. He is not diaphoretic.  Psychiatric: His speech is normal.       Cooperative and appropriate    ED Course  Procedures (including critical care time)  Labs Reviewed  CBC - Abnormal; Notable for the following:    WBC 3.6 (*)    RBC 3.46 (*)    Hemoglobin 9.5 (*)    HCT 28.7 (*)    Platelets 71 (*) PLATELET CLUMPS NOTED ON SMEAR, COUNT APPEARS DECREASED   All other components within normal limits  DIFFERENTIAL - Abnormal; Notable for  the following:    Monocytes Relative 0 (*)    Monocytes Absolute 0.0 (*)    All other components within normal limits  COMPREHENSIVE METABOLIC PANEL - Abnormal; Notable for the following:    Calcium 8.3 (*)    Albumin 2.9 (*)    GFR calc non Af Amer 57 (*)    GFR calc Af Amer 66 (*)    All other components within normal limits  URINALYSIS, ROUTINE W REFLEX MICROSCOPIC - Abnormal; Notable for the following:    Color, Urine AMBER (*) BIOCHEMICALS MAY BE  AFFECTED BY COLOR   APPearance TURBID (*)    Hgb urine dipstick TRACE (*)    Protein, ur 100 (*)    Leukocytes, UA MODERATE (*)    All other components within normal limits  URINE MICROSCOPIC-ADD ON - Abnormal; Notable for the following:    Squamous Epithelial / LPF FEW (*)    Bacteria, UA MANY (*)    All other components within normal limits  LACTIC ACID, PLASMA  PROCALCITONIN  I-STAT, CHEM 8  URINE CULTURE   Dg Chest 2 View  08/16/2011  *RADIOLOGY REPORT*  Clinical Data: Fever and weakness  CHEST - 2 VIEW  Comparison: 10/15/2010  Findings: Normal heart size and pulmonary vascularity.  Slight fibrosis in the lung bases.  Mild emphysematous changes.  No focal airspace consolidation.  There is a small focal nodular opacity measuring about 9 mm in the left midlung that was not present on the prior study.  CT is recommended to exclude a significant nodule.  Tortuous aorta.  Degenerative changes in the thoracic spine.  Anterior compression of T9 and L1 vertebrae, stable since previous studies.  IMPRESSION: No evidence of active consolidation in the lungs.  New appearance of 9 mm nodule in the left midlung.  CT recommended to exclude significant nodules.  Original Report Authenticated By: Marlon Pel, M.D.     1. Cancer   2. Fever   3. Neutropenia   4. UTI (lower urinary tract infection)    IV ABX for UTI. U Cx sent. MED c/s for admit.admitting MD felt IV cipro was appropriate at this time without further ABx recommendations.     MDM   fever. Low WBC. UTI. Admit        Sunnie Nielsen, MD 08/16/11 [phone]

## 2011-08-16 NOTE — Progress Notes (Addendum)
ANTIBIOTIC CONSULT NOTE - INITIAL  Pharmacy Consult for  Vancomycin/Cipro Indication:  Fever in immunocompromised pt.    Allergies  Allergen Reactions  . Morphine And Related Anxiety  . Penicillins Rash    Patient Measurements: Height: 5\' 11"  (180.3 cm) Weight: 221 lb 5.5 oz (100.4 kg) IBW/kg (Calculated) : 75.3    Vital Signs: Temp: 98.6 F (37 C) (12/31 0606) Temp src: Oral (12/31 0606) BP: 102/64 mmHg (12/31 0606) Pulse Rate: 78  (12/31 0606) Intake/Output from previous day: 12/30 0701 - 12/31 0700 In: 1000 [I.V.:1000] Out: -  Intake/Output from this shift: Total I/O In: 360 [P.O.:360] Out: 250 [Urine:250]  Labs:  [facility] 08/16/11 08/15/11 2350  WBC -- 3.6*  HGB 9.2* 9.5*  PLT -- 71*  LABCREA -- --  CREATININE 1.20 1.15   Estimated Creatinine Clearance: 57.3 ml/min (by C-G formula based on Cr of 1.2). No results found for this basename: VANCOTROUGH:2,VANCOPEAK:2,VANCORANDOM:2,GENTTROUGH:2,GENTPEAK:2,GENTRANDOM:2,TOBRATROUGH:2,TOBRAPEAK:2,TOBRARND:2,AMIKACINPEAK:2,AMIKACINTROU:2,AMIKACIN:2, in the last 72 hours   Microbiology: No results found for this or any previous visit (from the past 720 hour(s)).  Medical History: Past Medical History  Diagnosis Date  . Hypertension   . Chronic kidney disease 06/15/11    bladder cancer  . Blood transfusion   . Arthritis   . Cancer     bladder cancer, hx prostate cancer  . Anemia 08/16/2011    Medications:  Scheduled:    . acetaminophen  650 mg Oral Once  . amLODipine  10 mg Oral Daily  . ciprofloxacin  400 mg Intravenous Once  . finasteride  5 mg Oral QPC lunch  . rosuvastatin  10 mg Oral QHS  . sodium chloride  1,000 mL Intravenous Once  . vancomycin  750 mg Intravenous Q12H  . DISCONTD: simvastatin  40 mg Oral QHS   Infusions:    . 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  Pt is s/p Chemo 3 days ago. Pt. Also Has a hx of difficult to treat UTI's with indwelling catheter.  Urine clx pending Wt = 100 kg CrCl 57 ml/min (Normalized = 48 ml/min) Cultures: 12/31 Urine pending  Goal of Therapy:  Vancomycin trough 15-20  Plan:  1.) Vancomycin 750 mg iv q12h, first dose now; Cirpofloxacin 400 mg IV q12h.  2.) Monitor renal fnx 3.) f/u clx 4.) Check vancomycin trough when appropriate.   Clydene Fake PharmD 10:25 AM 08/16/2011

## 2011-08-16 NOTE — ED Notes (Signed)
MD at bedside. 

## 2011-08-16 NOTE — ED Notes (Signed)
[name] young  Daughter [phone]

## 2011-08-16 NOTE — Progress Notes (Signed)
Subjective: C/o Itchy eruption over back and upper trunk. Developed overnight.Otherwise, feels better today.  Objective: Vital signs in last 24 hours: Temp:  [98.6 F (37 C)-100.9 F (38.3 C)] 98.8 F (37.1 C) (12/31 1145) Pulse Rate:  [76-115] 80  (12/31 1145) Resp:  [18-20] 20  (12/31 1145) BP: (102-117)/(61-74) 117/61 mmHg (12/31 1145) SpO2:  [97 %-100 %] 100 % (12/31 1145) Weight:  [100.4 kg (221 lb 5.5 oz)] 221 lb 5.5 oz (100.4 kg) (12/31 0919) Weight change:     Intake/Output from previous day: 12/30 0701 - 12/31 0700 In: 1000 [I.V.:1000] Out: -  Total I/O In: 360 [P.O.:360] Out: 250 [Urine:250]   Physical Exam: General: Comfortable, alert, communicative, fully oriented, not short of breath at rest.  HEENT:  Mild clinical pallor, no jaundice, no conjunctival injection or discharge. NECK:  Supple, JVP not seen, no carotid bruits, no palpable lymphadenopathy, no palpable goiter. Hydration is satisfactory. CHEST:  Clinically clear to auscultation, no wheezes, no crackles. HEART:  Sounds 1 and 2 heard, normal, regular, no murmurs. ABDOMEN:  Full, soft, non-tender, no palpable organomegaly, no palpable masses, normal bowel sounds. GENITALIA:  Not examined. LOWER EXTREMITIES:  No pitting edema, palpable peripheral pulses. MUSCULOSKELETAL SYSTEM:  Generalized osteoarthritic changes, otherwise, normal. CENTRAL NERVOUS SYSTEM:  No focal neurologic deficit on gross examination. SKIN: Patient has a pruritic, macular rash, with excoriations, over back and upper chest.  Lab Results:  [facility] 08/16/11 08/15/11 2350  WBC -- 3.6*  HGB 9.2* 9.5*  HCT 27.0* 28.7*  PLT -- 71*    Basename 08/16/11 08/15/11 2350  NA 137 135  K 4.0 3.9  CL 104 101  CO2 -- 25  GLUCOSE 95 94  BUN 18 20  CREATININE 1.20 1.15  CALCIUM -- 8.3*   No results found for this or any previous visit (from the past 240 hour(s)).   Studies/Results: Dg Chest 2 View  08/16/2011  *RADIOLOGY REPORT*   Clinical Data: Fever and weakness  CHEST - 2 VIEW  Comparison: 10/15/2010  Findings: Normal heart size and pulmonary vascularity.  Slight fibrosis in the lung bases.  Mild emphysematous changes.  No focal airspace consolidation.  There is a small focal nodular opacity measuring about 9 mm in the left midlung that was not present on the prior study.  CT is recommended to exclude a significant nodule.  Tortuous aorta.  Degenerative changes in the thoracic spine.  Anterior compression of T9 and L1 vertebrae, stable since previous studies.  IMPRESSION: No evidence of active consolidation in the lungs.  New appearance of 9 mm nodule in the left midlung.  CT recommended to exclude significant nodules.  Original Report Authenticated By: Marlon Pel, M.D.    Medications: Scheduled Meds:   . acetaminophen  650 mg Oral Once  . amLODipine  10 mg Oral Daily  . ciprofloxacin  400 mg Intravenous Once  . ciprofloxacin  400 mg Intravenous Q12H  . finasteride  5 mg Oral QPC lunch  . rosuvastatin  10 mg Oral QHS  . sodium chloride  1,000 mL Intravenous Once  . vancomycin  750 mg Intravenous Q12H  . DISCONTD: simvastatin  40 mg Oral QHS   Continuous Infusions:   . sodium chloride 50 mL/hr at 08/16/11 1157   PRN Meds:.acetaminophen, LORazepam, prochlorperazine  Assessment/Plan:  Active Problems:  1. Urothelial malignancy: Patient is status post left nephroureterectomy in 10/20112. Now has lung lesions consistent with metastatic disease, and is on active chemotherapy.  2. Neutropenia: has mild leukopenia,  but absolute neutrophil count is not in the neurtopenic range, although this might change, given recent chemotherapy.  3. Fever: Patient has a positive urinary sediment, c/w UTI, which appears to be the likely culprit. Now on Day# 2 Ciprofloxacin. No recorded pyrexia overnight, and patient does not appear toxic. Patient is on Vancomycin day#1. I suspect this is overkill, but will continue, pending  culture results.  4. CKD (chronic kidney disease): Renal; indices are stable.  5. HTN (hypertension): BP is controlled.  6. Pruritic Rash: This developed overnight, ad appears to affect areas covered by hospital gown, with sparing of arms, neck and head. Patient has experienced this before, and contact dermatitis has to be suspected. Will manage with antihistaminics, topical steroids, and a change in hospital gown, to more hypoallergenic material.  Comment: Dr Alver Fisher office has been informed of hospitalization.   LOS: 1 day   [name],CHRISTOPHER 08/16/2011, 1:27 PM

## 2011-08-16 NOTE — Progress Notes (Signed)
ED CM spoke with Dr Francisco Capuchin about admission status clarification

## 2011-08-16 NOTE — ED Notes (Signed)
Patient ate 90% of meal.

## 2011-08-17 DIAGNOSIS — R509 Fever, unspecified: Secondary | ICD-10-CM

## 2011-08-17 LAB — DIFFERENTIAL
Band Neutrophils: 0 % (ref 0–10)
Basophils Absolute: 0 10*3/uL (ref 0.0–0.1)
Basophils Relative: 0 % (ref 0–1)
Eosinophils Absolute: 0 10*3/uL (ref 0.0–0.7)
Eosinophils Relative: 0 % (ref 0–5)
Lymphocytes Relative: 38 % (ref 12–46)
Lymphs Abs: 0.6 10*3/uL — ABNORMAL LOW (ref 0.7–4.0)
Monocytes Absolute: 0 10*3/uL — ABNORMAL LOW (ref 0.1–1.0)
Monocytes Relative: 1 % — ABNORMAL LOW (ref 3–12)
Neutro Abs: 1 10*3/uL — ABNORMAL LOW (ref 1.7–7.7)
Neutrophils Relative %: 61 % (ref 43–77)

## 2011-08-17 LAB — COMPREHENSIVE METABOLIC PANEL
ALT: 16 U/L (ref 0–53)
Alkaline Phosphatase: 52 U/L (ref 39–117)
BUN: 16 mg/dL (ref 6–23)
CO2: 26 mEq/L (ref 19–32)
Chloride: 100 mEq/L (ref 96–112)
GFR calc Af Amer: 64 mL/min — ABNORMAL LOW (ref >90)
GFR calc non Af Amer: 56 mL/min — ABNORMAL LOW (ref >90)
Glucose, Bld: 95 mg/dL (ref 70–99)
Potassium: 4 mEq/L (ref 3.5–5.1)
Total Bilirubin: 0.4 mg/dL (ref 0.3–1.2)
Total Protein: 5.5 g/dL — ABNORMAL LOW (ref 6.0–8.3)

## 2011-08-17 LAB — CBC
HCT: 26 % — ABNORMAL LOW (ref 39.0–52.0)
Hemoglobin: 8.6 g/dL — ABNORMAL LOW (ref 13.0–17.0)
MCHC: 33.1 g/dL (ref 30.0–36.0)
RBC: 3.15 MIL/uL — ABNORMAL LOW (ref 4.22–5.81)

## 2011-08-17 LAB — URINE CULTURE

## 2011-08-17 MED ORDER — SODIUM CHLORIDE 0.9 % IV SOLN
1.0000 g | INTRAVENOUS | Status: DC
  Administered 2011-08-17 – 2011-08-20 (×4): 1 g via INTRAVENOUS
  Filled 2011-08-17 (×4): qty 1

## 2011-08-17 MED ORDER — AMLODIPINE BESYLATE 5 MG PO TABS
5.0000 mg | ORAL_TABLET | Freq: Every day | ORAL | Status: DC
  Administered 2011-08-18 – 2011-08-20 (×3): 5 mg via ORAL
  Filled 2011-08-17 (×3): qty 1

## 2011-08-17 NOTE — Progress Notes (Signed)
This office note has been dictated.

## 2011-08-17 NOTE — Progress Notes (Signed)
Subjective: Patient still had fever of 101.1 last night.  No other specific complaints.  Objective: Vital signs in last 24 hours: Filed Vitals:   08/16/11 2315 08/17/11 0607 08/17/11 1101 08/17/11 1448  BP:  97/47 108/70 116/64  Pulse:  76 83 86  Temp: 99.5 F (37.5 C) 98.9 F (37.2 C) 98.4 F (36.9 C) 98.6 F (37 C)  TempSrc:  Oral Oral   Resp:  18 18 20   Height:      Weight:      SpO2:  98%  94%   Weight change:   Intake/Output Summary (Last 24 hours) at 08/17/11 1725 Last data filed at 08/17/11 1530  Gross per 24 hour  Intake 2180.5 ml  Output   3851 ml  Net -1670.5 ml    Physical Exam: General: Awake, Oriented, No acute distress. HEENT: EOMI. Neck: Supple CV: S1 and S2 Lungs: Clear to ascultation bilaterally Abdomen: Soft, Nontender, Nondistended, +bowel sounds. Ext: Good pulses. Trace edema.  Lab Results:  [facility] 08/17/11 0350 08/16/11 08/15/11 2350  NA 132* 137 --  K 4.0 4.0 --  CL 100 104 --  CO2 26 -- 25  GLUCOSE 95 95 --  BUN 16 18 --  CREATININE 1.18 1.20 --  CALCIUM 8.2* -- 8.3*  MG -- -- --  PHOS -- -- --    Basename 08/17/11 0350 08/15/11 2350  AST 14 18  ALT 16 18  ALKPHOS 52 60  BILITOT 0.4 0.3  PROT 5.5* 6.1  ALBUMIN 2.5* 2.9*   No results found for this basename: LIPASE:2,AMYLASE:2 in the last 72 hours  Basename 08/17/11 0350 08/16/11 08/15/11 2350  WBC 1.6* -- 3.6*  NEUTROABS 1.0* -- 2.7  HGB 8.6* 9.2* --  HCT 26.0* 27.0* --  MCV 82.5 -- 82.9  PLT PLATELET CLUMPS NOTED ON SMEAR, COUNT APPEARS DECREASED -- 71*   No results found for this basename: CKTOTAL:3,CKMB:3,CKMBINDEX:3,TROPONINI:3 in the last 72 hours No components found with this basename: POCBNP:3 No results found for this basename: DDIMER:2 in the last 72 hours No results found for this basename: HGBA1C:2 in the last 72 hours No results found for this basename: CHOL:2,HDL:2,LDLCALC:2,TRIG:2,CHOLHDL:2,LDLDIRECT:2 in the last 72 hours No results found for this  basename: TSH,T4TOTAL,FREET3,T3FREE,THYROIDAB in the last 72 hours No results found for this basename: VITAMINB12:2,FOLATE:2,FERRITIN:2,TIBC:2,IRON:2,RETICCTPCT:2 in the last 72 hours  Micro Results: Recent Results (from the past 240 hour(s))  URINE CULTURE     Status: Normal   Collection Time   08/16/11  3:50 AM      Component Value Range Status Comment   Specimen Description URINE, CATHETERIZED   Final    Special Requests NONE   Final    Setup Time [id number]   Final    Colony Count NO GROWTH   Final    Culture NO GROWTH   Final    Report Status 08/17/2011 FINAL   Final     Studies/Results: Dg Chest 2 View  08/16/2011  *RADIOLOGY REPORT*  Clinical Data: Fever and weakness  CHEST - 2 VIEW  Comparison: 10/15/2010  Findings: Normal heart size and pulmonary vascularity.  Slight fibrosis in the lung bases.  Mild emphysematous changes.  No focal airspace consolidation.  There is a small focal nodular opacity measuring about 9 mm in the left midlung that was not present on the prior study.  CT is recommended to exclude a significant nodule.  Tortuous aorta.  Degenerative changes in the thoracic spine.  Anterior compression of T9 and L1 vertebrae, stable since  previous studies.  IMPRESSION: No evidence of active consolidation in the lungs.  New appearance of 9 mm nodule in the left midlung.  CT recommended to exclude significant nodules.  Original Report Authenticated By: Marlon Pel, M.D.    Medications: I have reviewed the patient's current medications. Scheduled Meds:   . amLODipine  5 mg Oral Daily  . diphenhydrAMINE  25 mg Oral Q6H  . ertapenem  1 g Intravenous Q24H  . finasteride  5 mg Oral QPC lunch  . hydrocortisone cream   Topical BID  . rosuvastatin  10 mg Oral QHS  . DISCONTD: amLODipine  10 mg Oral Daily  . DISCONTD: ciprofloxacin  400 mg Intravenous Q12H  . DISCONTD: vancomycin  750 mg Intravenous Q12H   Continuous Infusions:   . sodium chloride 1,000 mL  (08/17/11 1154)   PRN Meds:.acetaminophen, LORazepam, prochlorperazine  Assessment/Plan: 1.  Urinary tract infection in the setting of chronic indwelling Foley and recurrent UTIs.  Urine culture from 08/16/2011 showed no growth to date.  Given the patient has been having fevers change antibiotics from vancomycin and ciprofloxacin to ertapenem to cover for ESBL.  If the patient is afebrile the transition from ertapenem to ciprofloxacin at the time of discharge.  Urologist Dr. Laverle Patter  2.  Urothelial cell carcinoma, status post left nephroureterectomy in October of 2012.  With metastatic disease to the lungs is on chemotherapy under the care of Dr. Clelia Croft.  3. Pancytopenia, Leukopenia, anemia, thrombocytopenia with neutropenia.  Likely due to chemotherapy.  Continue to monitor.  Anemia due to chronic disease.  4. Fever.  Management as indicated above.  5. Chronic kidney disease stage III status post nephroureterectomy.  Stable.  6. Hypertension.  Blood pressure soft, decrease dose of amlodipine to 5 mg a day.  7. Hyperlipemia.  Continue rosuvastatin.  8.  Pruritic rash.  Improved.  9.  Disposition.  Pending.   LOS: 2 days  Charma Mocarski A, MD 08/17/2011, 5:25 PM

## 2011-08-17 NOTE — Progress Notes (Signed)
Pt [place] of [address]. Has 10 mg norvasc ordered. MD notified. Reports he will change order in epic.

## 2011-08-18 LAB — DIFFERENTIAL
Band Neutrophils: 0 % (ref 0–10)
Basophils Relative: 0 % (ref 0–1)
Eosinophils Absolute: 0 10*3/uL (ref 0.0–0.7)
Lymphocytes Relative: 0 % — ABNORMAL LOW (ref 12–46)
Lymphs Abs: 0 10*3/uL — ABNORMAL LOW (ref 0.7–4.0)
Monocytes Absolute: 0 10*3/uL — ABNORMAL LOW (ref 0.1–1.0)
Neutrophils Relative %: 0 % — ABNORMAL LOW (ref 43–77)

## 2011-08-18 LAB — BASIC METABOLIC PANEL
CO2: 26 mEq/L (ref 19–32)
Calcium: 8.4 mg/dL (ref 8.4–10.5)
Creatinine, Ser: 1.28 mg/dL (ref 0.50–1.35)
GFR calc non Af Amer: 50 mL/min — ABNORMAL LOW (ref >90)
Sodium: 135 mEq/L (ref 135–145)

## 2011-08-18 LAB — CBC
MCH: 26.8 pg (ref 26.0–34.0)
MCHC: 32.5 g/dL (ref 30.0–36.0)
MCV: 82.5 fL (ref 78.0–100.0)
Platelets: 39 10*3/uL — ABNORMAL LOW (ref 150–400)
RBC: 3.32 MIL/uL — ABNORMAL LOW (ref 4.22–5.81)

## 2011-08-18 NOTE — Significant Event (Signed)
CRITICAL VALUE ALERT  Critical value received:  Wbc 0.9  Date of notification:  08/18/11  Time of notification:  0355  Critical value read back:yes  Nurse who received alert:  Jilda Panda  MD notified (1st page):  DR [facility]  Time of first page:  [phone]  MD notified (2nd page):  Time of second page:  Responding MD: DR Shirline Frees  Time MD responded:  [phone]

## 2011-08-18 NOTE — Progress Notes (Signed)
Subjective: No new issues today. In good spirits, eating well. No acute discomfort. Had low grade pyrexia.  Objective: Vital signs in last 24 hours: Temp:  [98.2 F (36.8 C)-100.1 F (37.8 C)] 98.2 F (36.8 C) (01/02 0401) Pulse Rate:  [71-90] 71  (01/02 0401) Resp:  [18-20] 18  (01/02 0401) BP: (116-126)/(64-69) 118/67 mmHg (01/02 0401) SpO2:  [94 %-95 %] 95 % (01/02 0401) Weight change:  Last BM Date: 08/17/11  Intake/Output from previous day: 01/01 0701 - 01/02 0700 In: 2463.5 [P.O.:1880; I.V.:583.5] Out: 1501 [Urine:1500; Stool:1]     Physical Exam: General: Comfortable, alert, communicative, fully oriented, not short of breath at rest.  HEENT: Modeerate clinical pallor, no jaundice, no conjunctival injection or discharge.  NECK: Supple, JVP not seen, no carotid bruits, no palpable lymphadenopathy, no palpable goiter. Hydration is satisfactory.  CHEST: Clinically clear to auscultation, no wheezes, no crackles.  HEART: Sounds 1 and 2 heard, normal, regular, no murmurs.  ABDOMEN: Full, soft, non-tender, no palpable organomegaly, no palpable masses, normal bowel sounds.  GENITALIA: Not examined.  LOWER EXTREMITIES: No pitting edema, palpable peripheral pulses.  MUSCULOSKELETAL SYSTEM: Generalized osteoarthritic changes, otherwise, normal.  CENTRAL NERVOUS SYSTEM: No focal neurologic deficit on gross examination.  SKIN: Rash over back and upper chest, has dramatically improved.  Lab Results:  Basename 08/18/11 0325 08/17/11 0350  WBC 0.9* 1.6*  HGB 8.9* 8.6*  HCT 27.4* 26.0*  PLT 39* PLATELET CLUMPS NOTED ON SMEAR, COUNT APPEARS DECREASED    Basename 08/18/11 0325 08/17/11 0350  NA 135 132*  K 4.1 4.0  CL 102 100  CO2 26 26  GLUCOSE 93 95  BUN 18 16  CREATININE 1.28 1.18  CALCIUM 8.4 8.2*   Recent Results (from the past 240 hour(s))  URINE CULTURE     Status: Normal   Collection Time   08/16/11  3:50 AM      Component Value Range Status Comment   Specimen  Description URINE, CATHETERIZED   Final    Special Requests NONE   Final    Setup Time [id number]   Final    Colony Count NO GROWTH   Final    Culture NO GROWTH   Final    Report Status 08/17/2011 FINAL   Final      Studies/Results: No results found.  Medications: Scheduled Meds:   . amLODipine  5 mg Oral Daily  . diphenhydrAMINE  25 mg Oral Q6H  . ertapenem  1 g Intravenous Q24H  . finasteride  5 mg Oral QPC lunch  . hydrocortisone cream   Topical BID  . rosuvastatin  10 mg Oral QHS   Continuous Infusions:   . sodium chloride 50 mL/hr at 08/18/11 0625   PRN Meds:.acetaminophen, LORazepam, prochlorperazine  Assessment/Plan:  Active Problems:  1. Urothelial malignancy: Patient is status post left nephroureterectomy in 10/20112. Now has lung lesions consistent with metastatic disease, and is on active chemotherapy.  2. Pancytopenia: WCC and absolute neutrophil count continue to drop, and patient is now neutropenic. This is of course, secondary to chemotherapy. We shall institute neutropenic precautions and manage per Hematologist recommendations. 3. Fever: Patient has a positive urinary sediment, c/w UTI, which appears to be the likely culprit. Now on Day# 2 Invanz. Antibiotic regimen was changed to include ESBL, as patient was spiking through initial regimen. Cultures have remained negative, and temperature is trending down. Patient does not appear toxic.  4. CKD (chronic kidney disease): Renal indices are stable.  5. HTN (hypertension): BP is  controlled.  6. Pruritic Rash: Contact dermatitis, due to hospital gown, with sparing of arms, neck and head. Patient has experienced this before. He has been managed with antihistaminics, topical steroids, and a change in hospital gown, to more hypoallergenic material, with dramatic improvement. Will discontinue Benadryl..   Comment: Will likely remain hospitalized, till neutropenia improves.    LOS: 3 days    Lorelee Mcintosh,Brian 08/18/2011, 1:06 PM

## 2011-08-18 NOTE — Plan of Care (Signed)
Problem: Phase III Progression Outcomes Goal: Voiding independently Outcome: Not Applicable Date Met:  08/18/11 Patient uses foley chronically at home for obstruction issues.

## 2011-08-19 DIAGNOSIS — J984 Other disorders of lung: Secondary | ICD-10-CM

## 2011-08-19 DIAGNOSIS — C679 Malignant neoplasm of bladder, unspecified: Secondary | ICD-10-CM

## 2011-08-19 DIAGNOSIS — D61818 Other pancytopenia: Secondary | ICD-10-CM

## 2011-08-19 LAB — DIFFERENTIAL
Basophils Absolute: 0 10*3/uL (ref 0.0–0.1)
Basophils Relative: 0 % (ref 0–1)
Eosinophils Absolute: 0 10*3/uL (ref 0.0–0.7)
Lymphs Abs: 0.7 10*3/uL (ref 0.7–4.0)
Monocytes Absolute: 0.1 10*3/uL (ref 0.1–1.0)
Neutro Abs: 0.4 10*3/uL — ABNORMAL LOW (ref 1.7–7.7)

## 2011-08-19 LAB — CBC
HCT: 27.3 % — ABNORMAL LOW (ref 39.0–52.0)
Hemoglobin: 9 g/dL — ABNORMAL LOW (ref 13.0–17.0)
MCH: 27 pg (ref 26.0–34.0)
MCHC: 33 g/dL (ref 30.0–36.0)
RDW: 13.8 % (ref 11.5–15.5)

## 2011-08-19 LAB — BASIC METABOLIC PANEL
BUN: 16 mg/dL (ref 6–23)
CO2: 27 mEq/L (ref 19–32)
Calcium: 8.7 mg/dL (ref 8.4–10.5)
Creatinine, Ser: 1.13 mg/dL (ref 0.50–1.35)
GFR calc non Af Amer: 59 mL/min — ABNORMAL LOW (ref >90)
Glucose, Bld: 97 mg/dL (ref 70–99)

## 2011-08-19 NOTE — Progress Notes (Signed)
Subjective: Asymptomatic.  Objective: Vital signs in last 24 hours: Temp:  [98 F (36.7 C)-98.6 F (37 C)] 98.6 F (37 C) (01/03 1415) Pulse Rate:  [85-88] 85  (01/03 1415) Resp:  [15-18] 18  (01/03 1415) BP: (105-131)/(64-79) 105/68 mmHg (01/03 1415) SpO2:  [90 %-99 %] 97 % (01/03 1415) Weight change:  Last BM Date: 08/17/11  Intake/Output from previous day: 01/02 0701 - 01/03 0700 In: 1080 [P.O.:1080] Out: 3000 [Urine:3000] Total I/O In: 600 [P.O.:600] Out: 1100 [Urine:1100]   Physical Exam: General: Sitting in chair, comfortable, alert, communicative, fully oriented, not short of breath at rest.  HEENT: Moderate clinical pallor, no jaundice, no conjunctival injection or discharge. Hydration is fair. NECK: Supple, JVP not seen, no carotid bruits, no palpable lymphadenopathy, no palpable goiter. Hydration is satisfactory.  CHEST: Clinically clear to auscultation, no wheezes, no crackles.  HEART: Sounds 1 and 2 heard, normal, regular, no murmurs.  ABDOMEN: Full, soft, non-tender, no palpable organomegaly, no palpable masses, normal bowel sounds.  GENITALIA: Not examined.  LOWER EXTREMITIES: No pitting edema, palpable peripheral pulses.  MUSCULOSKELETAL SYSTEM: Generalized osteoarthritic changes, otherwise, normal.  CENTRAL NERVOUS SYSTEM: No focal neurologic deficit on gross examination.  SKIN: Rash over back and upper chest, has dramatically improved.  Lab Results:  Basename 08/19/11 0315 08/18/11 0325  WBC 1.2* 0.9*  HGB 9.0* 8.9*  HCT 27.3* 27.4*  PLT 38* 39*    Basename 08/19/11 0315 08/18/11 0325  NA 134* 135  K 4.3 4.1  CL 100 102  CO2 27 26  GLUCOSE 97 93  BUN 16 18  CREATININE 1.13 1.28  CALCIUM 8.7 8.4   Recent Results (from the past 240 hour(s))  URINE CULTURE     Status: Normal   Collection Time   08/16/11  3:50 AM      Component Value Range Status Comment   Specimen Description URINE, CATHETERIZED   Final    Special Requests NONE   Final    Setup Time [id number]   Final    Colony Count NO GROWTH   Final    Culture NO GROWTH   Final    Report Status 08/17/2011 FINAL   Final      Studies/Results: No results found.  Medications: Scheduled Meds:    . amLODipine  5 mg Oral Daily  . ertapenem  1 g Intravenous Q24H  . finasteride  5 mg Oral QPC lunch  . hydrocortisone cream   Topical BID  . rosuvastatin  10 mg Oral QHS   Continuous Infusions:    . sodium chloride 50 mL/hr at 08/19/11 0226   PRN Meds:.acetaminophen, LORazepam, prochlorperazine  Assessment/Plan:  Active Problems:  1. Urothelial malignancy: Patient is status post left nephroureterectomy in 10/20112. Now has lung lesions consistent with metastatic disease, and is on active chemotherapy. No change. 2. Pancytopenia/Neutropenia. This is of course, secondary to chemotherapy. Patient is on neutropenic precautions and has been seen by Dr Clelia Croft today. Recommendations have been noted. Wcc appears to be recovering. 3. Fever: Patient has a positive urinary sediment, c/w UTI, which appears to be the likely culprit. Now on Day# 3 Invanz. Antibiotic regimen was changed to include ESBL, as patient was spiking through initial regimen. Cultures have remained negative, and temperature has not recurred in the past 24 hours. Patient does not appear toxic. We shall likely transition to oral Levaquin at discharge. 4. CKD (chronic kidney disease): Renal indices are stable.  5. HTN (hypertension): BP is controlled.  6. Pruritic Rash: Contact dermatitis,  due to hospital gown, with sparing of arms, neck and head. Patient has experienced this before. He has been managed with antihistaminics, topical steroids, and a change in hospital gown, to more hypoallergenic material, with dramatic improvement and resolution. Will discontinue topicalsteroids at this point.   Comment: Nearing discharge.    LOS: 4 days   [name],[name] 08/19/2011, 4:59 PM

## 2011-08-19 NOTE — Progress Notes (Signed)
[name] was admitted by the hospitalist.  He is an 78 year old gentleman followed by Dr. Clelia Croft.  He has metastatic transitional cell carcinoma.  He underwent left nephroureterectomy back in 2011.  He had a high-grade tumor.  He now has metastatic disease to his lungs.  He had a PET scan done which did show enlarging hypermetabolic lesions in the left upper and lower lobes.  He had no other signs of obvious malignancy.  He was started on systemic chemotherapy with carboplatin/Gemzar.  He was last evaluated by Dr. Clelia Croft on the 20th.  He had Gemzar cycle 1, day 8 on 08/12/2011.  He said that he has these problems with temperature spikes.  He has a chronic indwelling Foley.  He says these spikes have been routine.  When he gets the temperature spikes, he feels pretty poor.  He came into the hospital this time feeling a little weak.  He had a persistent temperature over 101.  When was admitted, his white count was 3.6 with a neutrophil count of 2.7.  He subsequently had cultures done.  His urine chronically looks infected.  He has a urine culture result pending.  The patient has been started on antibiotics with Cipro.  He has been with a low-grade temperature.  His temperature has gone up to 101.  His appetite is okay.  He has no nausea or vomiting.  He has had no bleeding.  We were asked to see him just to be made known that he is in the hospital.  He currently is on Cipro.  His other medications are:  Norvasc 10 mg a day, Proscar 5 mg a day, Crestor 10 mg p.o. daily.  He is also on vancomycin 750 mg q.12 hours, Ativan 0.5 mg q.6 hours as needed, and Compazine 10 mg q.6 hours as needed.  PHYSICAL EXAM:  This is an elderly but fairly well-nourished white gentleman in no obvious distress.  Vital Signs:  98.9, pulse 76, respiratory rate 18, blood pressure 97/47.  Head and Neck Exam:  No ocular or oral lesions.  There are no palpable cervical or supraclavicular lymph nodes.   Lungs:  Clear bilaterally.  Cardiac Exam: Regular rate and rhythm with normal S1 and S2.  There are no murmurs, rubs, or bruits.  Abdominal Exam:  Soft with good bowel sounds.  There is no palpable abdominal mass.  He does have the surgical scar from his left nephroureterectomy.  Extremities:  No clubbing, cyanosis, or edema.  He is not neutropenic.  This is certainly very important.  From what he tells me, his temperatures do tend to go up and down.  Today, his white cell count is down and his white count 1.6.  Hemoglobin is 8.6, hematocrit 26.  His absolute neutrophil count is 1.  For now, I would just follow him along.  I will continue him on the IV antibiotics.  It is possible he may need some kind of chronic suppressive therapy for his indwelling Foley.    ______________________________ [name], M.D. PRE/MEDQ  D:  08/17/2011  T:  08/17/2011  Job:  [id number]

## 2011-08-19 NOTE — Progress Notes (Signed)
Subjective: Patient feels well. Low grade temp noted.   Objective: Vital signs in last 24 hours: Temp:  [98 F (36.7 C)-98.8 F (37.1 C)] 98 F (36.7 C) (01/03 0603) Pulse Rate:  [87-91] 87  (01/03 0603) Resp:  [15-18] 15  (01/03 0603) BP: (117-131)/(64-79) 131/79 mmHg (01/03 0603) SpO2:  [90 %-99 %] 90 % (01/03 0603) Weight change:  Last BM Date: 08/17/11  Intake/Output from previous day: 01/02 0701 - 01/03 0700 In: 1080 [P.O.:1080] Out: 3000 [Urine:3000]     Physical Exam: General: Comfortable, alert, communicative, fully oriented, not short of breath at rest.  HEENT: Modeerate clinical pallor, no jaundice, no conjunctival injection or discharge.  NECK: Supple, JVP not seen, no carotid bruits, no palpable lymphadenopathy, no palpable goiter. Hydration is satisfactory.  CHEST: Clinically clear to auscultation, no wheezes, no crackles.  HEART: Sounds 1 and 2 heard, normal, regular, no murmurs.  ABDOMEN: Full, soft, non-tender, no palpable organomegaly, no palpable masses, normal bowel sounds.  GENITALIA: Not examined.  LOWER EXTREMITIES: No pitting edema, palpable peripheral pulses.  MUSCULOSKELETAL SYSTEM: Generalized osteoarthritic changes, otherwise, normal.  CENTRAL NERVOUS SYSTEM: No focal neurologic deficit on gross examination.  SKIN: Rash over back and upper chest, has dramatically improved.  Lab Results:  Basename 08/19/11 0315 08/18/11 0325  WBC 1.2* 0.9*  HGB 9.0* 8.9*  HCT 27.3* 27.4*  PLT 38* 39*    Basename 08/19/11 0315 08/18/11 0325  NA 134* 135  K 4.3 4.1  CL 100 102  CO2 27 26  GLUCOSE 97 93  BUN 16 18  CREATININE 1.13 1.28  CALCIUM 8.7 8.4   Recent Results (from the past 240 hour(s))  URINE CULTURE     Status: Normal   Collection Time   08/16/11  3:50 AM      Component Value Range Status Comment   Specimen Description URINE, CATHETERIZED   Final    Special Requests NONE   Final    Setup Time [id number]   Final    Colony Count NO  GROWTH   Final    Culture NO GROWTH   Final    Report Status 08/17/2011 FINAL   Final      Studies/Results: No results found.  Medications: Scheduled Meds:    . amLODipine  5 mg Oral Daily  . ertapenem  1 g Intravenous Q24H  . finasteride  5 mg Oral QPC lunch  . hydrocortisone cream   Topical BID  . rosuvastatin  10 mg Oral QHS  . DISCONTD: diphenhydrAMINE  25 mg Oral Q6H   Continuous Infusions:    . sodium chloride 50 mL/hr at 08/19/11 0226   PRN Meds:.acetaminophen, LORazepam, prochlorperazine  Assessment/Plan:  Active Problems:  1. Urothelial malignancy: Patient is status post left nephroureterectomy in 10/20112. Now has lung lesions consistent with metastatic disease, and is on active chemotherapy. S/P the first cycle of gemzar and carboplatin. 2. Pancytopenia: Likely due to chemotherapy. Improving. ANC close to 1000 3. Fever: Patient has a positive urinary sediment, c/w UTI, which appears to be the likely culprit. Now on Day# 3 Invanz. Antibiotic regimen was changed to include ESBL. His fever also could be related to WBC recovery as well. 4. Dispo: ok for D/C if ANC continue to improve in the next 24 hrs.       LOS: 4 days   Jozette Castrellon 08/19/2011, 1:38 PM

## 2011-08-20 DIAGNOSIS — N390 Urinary tract infection, site not specified: Secondary | ICD-10-CM | POA: Diagnosis present

## 2011-08-20 LAB — BASIC METABOLIC PANEL
CO2: 26 mEq/L (ref 19–32)
Calcium: 8.6 mg/dL (ref 8.4–10.5)
GFR calc non Af Amer: 54 mL/min — ABNORMAL LOW (ref >90)
Potassium: 4.2 mEq/L (ref 3.5–5.1)
Sodium: 134 mEq/L — ABNORMAL LOW (ref 135–145)

## 2011-08-20 LAB — DIFFERENTIAL
Eosinophils Absolute: 0 10*3/uL (ref 0.0–0.7)
Eosinophils Relative: 0 % (ref 0–5)
Metamyelocytes Relative: 0 %
Myelocytes: 0 %
Promyelocytes Absolute: 0 %
nRBC: 0 /100 WBC

## 2011-08-20 LAB — CBC
MCH: 27.5 pg (ref 26.0–34.0)
MCHC: 33.7 g/dL (ref 30.0–36.0)
MCV: 81.7 fL (ref 78.0–100.0)
Platelets: 53 10*3/uL — ABNORMAL LOW (ref 150–400)
RBC: 3.45 MIL/uL — ABNORMAL LOW (ref 4.22–5.81)
RDW: 13.9 % (ref 11.5–15.5)

## 2011-08-20 MED ORDER — HEPARIN SOD (PORK) LOCK FLUSH 100 UNIT/ML IV SOLN
INTRAVENOUS | Status: AC
  Filled 2011-08-20: qty 5

## 2011-08-20 MED ORDER — LEVOFLOXACIN 500 MG PO TABS: 500.0000 mg | ORAL_TABLET | Freq: Every day | ORAL | Status: AC

## 2011-08-20 MED ORDER — AMLODIPINE BESYLATE 10 MG PO TABS: 5.0000 mg | ORAL_TABLET | ORAL | Status: AC

## 2011-08-20 NOTE — Progress Notes (Signed)
Subjective: Patient feels well. No fever noted.  Objective: Vital signs in last 24 hours: Temp:  [97.9 F (36.6 C)-98.6 F (37 C)] 97.9 F (36.6 C) (01/04 1610) Pulse Rate:  [73-86] 73  (01/04 0632) Resp:  [18-20] 18  (01/04 0632) BP: (105-117)/(68-75) 117/75 mmHg (01/04 0632) SpO2:  [97 %] 97 % (01/04 9604) Weight change:  Last BM Date: 08/17/11  Intake/Output from previous day: 01/03 0701 - 01/04 0700 In: 1440 [P.O.:1440] Out: 2550 [Urine:2550]     Physical Exam: General: Comfortable, alert, communicative, fully oriented, not short of breath at rest.  HEENT: Modeerate clinical pallor, no jaundice, no conjunctival injection or discharge.  NECK: Supple, JVP not seen, no carotid bruits, no palpable lymphadenopathy, no palpable goiter. Hydration is satisfactory.  CHEST: Clinically clear to auscultation, no wheezes, no crackles.  HEART: Sounds 1 and 2 heard, normal, regular, no murmurs.  ABDOMEN: Full, soft, non-tender, no palpable organomegaly, no palpable masses, normal bowel sounds.  GENITALIA: Not examined.  LOWER EXTREMITIES: No pitting edema, palpable peripheral pulses.  MUSCULOSKELETAL SYSTEM: Generalized osteoarthritic changes, otherwise, normal.  CENTRAL NERVOUS SYSTEM: No focal neurologic deficit on gross examination.  SKIN: Rash over back and upper chest, has dramatically improved.  Lab Results:  Basename 08/20/11 0315 08/19/11 0315  WBC 1.9* 1.2*  HGB 9.5* 9.0*  HCT 28.2* 27.3*  PLT 53* 38*    Basename 08/20/11 0315 08/19/11 0315  NA 134* 134*  K 4.2 4.3  CL 101 100  CO2 26 27  GLUCOSE 96 97  BUN 17 16  CREATININE 1.20 1.13  CALCIUM 8.6 8.7   Recent Results (from the past 240 hour(s))  URINE CULTURE     Status: Normal   Collection Time   08/16/11  3:50 AM      Component Value Range Status Comment   Specimen Description URINE, CATHETERIZED   Final    Special Requests NONE   Final    Setup Time [id number]   Final    Colony Count NO GROWTH   Final     Culture NO GROWTH   Final    Report Status 08/17/2011 FINAL   Final      Studies/Results: No results found.  Medications: Scheduled Meds:    . amLODipine  5 mg Oral Daily  . ertapenem  1 g Intravenous Q24H  . finasteride  5 mg Oral QPC lunch  . rosuvastatin  10 mg Oral QHS  . DISCONTD: hydrocortisone cream   Topical BID   Continuous Infusions:    . DISCONTD: sodium chloride 50 mL/hr at 08/19/11 0226   PRN Meds:.acetaminophen, LORazepam, prochlorperazine  Assessment/Plan:  Active Problems:  1. Urothelial malignancy: Patient is status post left nephroureterectomy in 10/20112. Now has lung lesions consistent with metastatic disease, and is on active chemotherapy. S/P the first cycle of gemzar and carboplatin. 2. Pancytopenia: Likely due to chemotherapy. Improving. 3. Fever: Patient has a positive urinary sediment, c/w UTI, which appears to be the likely culprit. Now on Day# 3 Invanz. Antibiotic regimen was changed to include ESBL. His fever also could be related to WBC recovery as well. He is afebrile now. 4. Dispo: ok for D/C from my stand point. Follow up is set for next week.       LOS: 5 days   [name] 08/20/2011, 7:31 AM

## 2011-08-20 NOTE — Discharge Summary (Signed)
Physician Discharge Summary  Patient ID: [name] MRN: [id number] DOB/AGE: 12/02/1926 78 y.o.  Admit date: 08/15/2011 Discharge date: 08/20/2011  Primary Care Physician:  Ginette Otto, MD, MD Primary Oncologist: Dr Eli Hose.  Discharge Diagnoses:    Patient Active Problem List  Diagnoses  . Cancer  . Renal cell cancer  . Neutropenia  . Fever  . Indwelling urinary catheter infection or inflammation  . Lung metastases  . History of prostate cancer  . Thrombocytopenia  . Anemia  . CKD (chronic kidney disease)  . HTN (hypertension)  . Hyperlipemia  . UTI (lower urinary tract infection)    Current Discharge Medication List    START taking these medications   Details  levofloxacin (LEVAQUIN) 500 MG tablet Take 1 tablet (500 mg total) by mouth daily. Qty: 5 tablet, Refills: 0      CONTINUE these medications which have CHANGED   Details  amLODipine (NORVASC) 10 MG tablet Take 0.5 tablets (5 mg total) by mouth every morning. Qty: 30 tablet, Refills: 0      CONTINUE these medications which have NOT CHANGED   Details  acetaminophen (TYLENOL) 500 MG tablet Take 500-1,000 mg by mouth every 6 (six) hours as needed.      dexamethasone (DECADRON) 4 MG tablet Take 2 tablets by mouth two times a day starting the day after chemotherapy for 3 days. Qty: 30 tablet, Refills: 1   Associated Diagnoses: Cancer    finasteride (PROSCAR) 5 MG tablet Take 5 mg by mouth daily after lunch daily after lunch.      LORazepam (ATIVAN) 0.5 MG tablet Take 1 tablet (0.5 mg total) by mouth every 6 (six) hours as needed (Nausea or vomiting). Qty: 30 tablet, Refills: 0   Associated Diagnoses: Cancer    ondansetron (ZOFRAN) 8 MG tablet Take 1 tab two times a day starting the day after chemo for 3 days. Then take 1 tab two times a day as needed for nausea or vomiting.  Qty: 30 tablet, Refills: 1   Associated Diagnoses: Cancer    prochlorperazine (COMPAZINE) 10 MG tablet Take 1  tablet (10 mg total) by mouth every 6 (six) hours as needed (Nausea or vomiting). Qty: 30 tablet, Refills: 1   Associated Diagnoses: Cancer    prochlorperazine (COMPAZINE) 25 MG suppository Place 1 suppository (25 mg total) rectally every 12 (twelve) hours as needed for nausea. Qty: 12 suppository, Refills: 3   Associated Diagnoses: Cancer    simvastatin (ZOCOR) 80 MG tablet Take 40 mg by mouth at bedtime.           Disposition and Follow-up: Follow up with primary MD, Urologist and Oncologist.  Consults:  hematology/oncology  Dr Myna Hidalgo.  Significant Diagnostic Studies:  Dg Chest 2 View  08/16/2011  *RADIOLOGY REPORT*  Clinical Data: Fever and weakness  CHEST - 2 VIEW  Comparison: 10/15/2010  Findings: Normal heart size and pulmonary vascularity.  Slight fibrosis in the lung bases.  Mild emphysematous changes.  No focal airspace consolidation.  There is a small focal nodular opacity measuring about 9 mm in the left midlung that was not present on the prior study.  CT is recommended to exclude a significant nodule.  Tortuous aorta.  Degenerative changes in the thoracic spine.  Anterior compression of T9 and L1 vertebrae, stable since previous studies.  IMPRESSION: No evidence of active consolidation in the lungs.  New appearance of 9 mm nodule in the left midlung.  CT recommended to exclude significant nodules.  Original  Report Authenticated By: Marlon Pel, M.D.    Brief H and P: For complete details, refer to admission H and P. However, this is an 78 y.o. 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 He underwent an open left nephroureterectomy done on June 04, 2010, with pathology demonstrative of a papillary invasive high-grade urothelial carcinoma with negative  urinary bladder cuff margins. The tumor was 3.8 cm. No lymphovascular invasion. Patient was sent To the emergency department by his oncologist after patient reported persistent elevated temperature greater than 101. In the emergency department he was found to have pyuria and bacteriuria. He was admitted for further evaluation, investigation and management.   Physical Exam: On 08/20/11.  General: Sitting in chair, comfortable, alert, communicative, fully oriented, not short of breath at rest.  HEENT: Moderate clinical pallor, no jaundice, no conjunctival injection or discharge. Hydration is fair.  NECK: Supple, JVP not seen, no carotid bruits, no palpable lymphadenopathy, no palpable goiter. Hydration is satisfactory.  CHEST: Clinically clear to auscultation, no wheezes, no crackles.  HEART: Sounds 1 and 2 heard, normal, regular, no murmurs.  ABDOMEN: Full, soft, non-tender, no palpable organomegaly, no palpable masses, normal bowel sounds.  GENITALIA: Not examined.  LOWER EXTREMITIES: No pitting edema, palpable peripheral pulses.  MUSCULOSKELETAL SYSTEM: Generalized osteoarthritic changes, otherwise, normal.  CENTRAL NERVOUS SYSTEM: No focal neurologic deficit on gross examination.  SKIN: Rash over back and upper chest, has practically resolved.   Hospital Course:  Active Problems:  1. Urothelial malignancy: As described in presenting history, patient is status post left nephroureterectomy in 10/20112, now has lung lesions consistent with metastatic disease, and is on active chemotherapy. He has completed the first cycle of gemzar and carboplatin. 2. Pancytopenia/Neutropenia. This is of course, secondary to chemotherapy. Patient was placed  on neutropenic precautions and CBC was followed. Wcc was 3.6 at presentation, with ANC of 2.7. Over the course of his hospitalization, WCC dropped to a low of 0.9, and then gradually started trending up. Initial Oncology consultation was provided by [facility], and  patient was subsequently seen by Dr. Clelia Croft. As of 08/20/11, wcc had improved at 1.9, and patient was cleared by primary oncologist for discharge, from oncology viewpoint.  3. Fever: Patient has a positive urinary sediment, c/w UTI, which appeared to be the likely culprit. Patient was initially managed with Vancomycin/Zosyn, which were replaced with Invanz, when patient continued to spike through antibiotic. Over the past 48-96 hours, patient has remained afebrile, and wellbeing has improved. As of /4/13, patient had completed 4 days of Invanz. Cultures have remained negative. He has been transitioned to oral Levaquin at discharge, for a further 5 days of therapy.  4. CKD (chronic kidney disease): Renal indices are stable.  5. HTN (hypertension): BP was controlled during this hospitalization.  6. Pruritic Rash: Patient was noted in AM of 08/15/12, to have contact dermatitis, due to hospital gown, with sparing of arms, neck and head. Patient has experienced this before. He has been managed with antihistaminics, topical steroids, and a change in hospital gown, to more hypoallergenic material, with dramatic improvement and resolution.   Comment: Patient was considered clinically stable for discharge on 08/20/2011.  Time spent on Discharge: 35 mins.  Signed: Aniaya Bacha,CHRISTOPHER 08/20/2011, 2:27 PM

## 2011-08-26 ENCOUNTER — Ambulatory Visit (HOSPITAL_BASED_OUTPATIENT_CLINIC_OR_DEPARTMENT_OTHER): Payer: Medicare Other

## 2011-08-26 ENCOUNTER — Telehealth: Payer: Self-pay | Admitting: Oncology

## 2011-08-26 ENCOUNTER — Other Ambulatory Visit: Payer: Self-pay | Admitting: Oncology

## 2011-08-26 ENCOUNTER — Ambulatory Visit: Payer: Medicare Other | Admitting: Oncology

## 2011-08-26 ENCOUNTER — Other Ambulatory Visit: Payer: Medicare Other | Admitting: Lab

## 2011-08-26 DIAGNOSIS — Z5111 Encounter for antineoplastic chemotherapy: Secondary | ICD-10-CM

## 2011-08-26 DIAGNOSIS — C801 Malignant (primary) neoplasm, unspecified: Secondary | ICD-10-CM

## 2011-08-26 DIAGNOSIS — C649 Malignant neoplasm of unspecified kidney, except renal pelvis: Secondary | ICD-10-CM

## 2011-08-26 DIAGNOSIS — C7800 Secondary malignant neoplasm of unspecified lung: Secondary | ICD-10-CM

## 2011-08-26 DIAGNOSIS — C679 Malignant neoplasm of bladder, unspecified: Secondary | ICD-10-CM

## 2011-08-26 LAB — COMPREHENSIVE METABOLIC PANEL
AST: 16 U/L (ref 0–37)
Albumin: 3.8 g/dL (ref 3.5–5.2)
Alkaline Phosphatase: 78 U/L (ref 39–117)
Glucose, Bld: 98 mg/dL (ref 70–99)
Potassium: 4.6 mEq/L (ref 3.5–5.3)
Sodium: 137 mEq/L (ref 135–145)
Total Protein: 6.3 g/dL (ref 6.0–8.3)

## 2011-08-26 LAB — CBC WITH DIFFERENTIAL/PLATELET
EOS%: 3 % (ref 0.0–7.0)
MCH: 27.1 pg — ABNORMAL LOW (ref 27.2–33.4)
MCV: 83 fL (ref 79.3–98.0)
MONO%: 22.9 % — ABNORMAL HIGH (ref 0.0–14.0)
RBC: 3.95 10*6/uL — ABNORMAL LOW (ref 4.20–5.82)
RDW: 15.2 % — ABNORMAL HIGH (ref 11.0–14.6)
nRBC: 0 % (ref 0–0)

## 2011-08-26 LAB — TECHNOLOGIST REVIEW

## 2011-08-26 MED ORDER — SODIUM CHLORIDE 0.9 % IV SOLN
Freq: Once | INTRAVENOUS | Status: AC
  Administered 2011-08-26: 11:00:00 via INTRAVENOUS

## 2011-08-26 MED ORDER — SODIUM CHLORIDE 0.9 % IV SOLN
1000.0000 mg/m2 | Freq: Once | INTRAVENOUS | Status: AC
  Administered 2011-08-26: 2242 mg via INTRAVENOUS
  Filled 2011-08-26: qty 59

## 2011-08-26 MED ORDER — SODIUM CHLORIDE 0.9 % IV SOLN
471.0000 mg | Freq: Once | INTRAVENOUS | Status: AC
  Administered 2011-08-26: 470 mg via INTRAVENOUS
  Filled 2011-08-26: qty 47

## 2011-08-26 MED ORDER — DEXAMETHASONE SODIUM PHOSPHATE 4 MG/ML IJ SOLN
20.0000 mg | Freq: Once | INTRAMUSCULAR | Status: AC
  Administered 2011-08-26: 20 mg via INTRAVENOUS

## 2011-08-26 MED ORDER — ONDANSETRON 16 MG/50ML IVPB (CHCC)
16.0000 mg | Freq: Once | INTRAVENOUS | Status: AC
  Administered 2011-08-26: 16 mg via INTRAVENOUS

## 2011-08-26 NOTE — Patient Instructions (Signed)
Pt discharged home with spouse ambulatory.  Pt to call for questions or concerns.

## 2011-08-26 NOTE — Progress Notes (Signed)
Hematology and Oncology Follow Up Visit  [name] [id number] January 18, 1927 78 y.o. 08/26/2011 9:23 AM   CC: Brian Purpura, MD  Brian Kindle, MD   Principle Diagnosis: This is a pleasant 78 year old gentleman with the following issues:  1. 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 Now has metastatic disease with lung nodules. 2. Prostate cancer: He is s/p treatment with brachytherapy in December 2002. He has had no evidence for cancer recurrence since treatment.    Prior Therapy: He underwent an open left nephroureterectomy done on June 04, 2010. His pathology from that showed [phone], showed that he had a papillary invasive high-grade urothelial carcinoma with negative urinary bladder cuff margins. The tumor was 3.8 cm. No lymphovascular invasion. Again, was a T2 NX disease.   Current therapy: Patient to start day 1, cycle 2 of chemotherapy of Carboplatin and Gemzar.  Interim History: [name] presents for a follow up visit since his last visit to start the second cycle of chemotherapy. He tolerated chemotherapy well, but did develop a fever and required hospitalization due to UTI and mild neutropenia. He is really asymptomatic at this time. He is no longer reporting any fever. He did not report any abdominal pain. He had not reported any chest pain. Had not reported any hematuria. Had not reported any flank pain. Had not reported really any major changes in his performance status or activity level at this time. He has continued to perform activities of daily living without any hindrance or decline. He has continued to drive and attends to again activities of daily living without problems at this point. He still has a foley catheter in place. He is ready to proceed with his second cycle of chemotherapy. No complications related to chemotherapy noted.   Medications: I have reviewed the  patient's current medications. Current outpatient prescriptions:acetaminophen (TYLENOL) 500 MG tablet, Take 500-1,000 mg by mouth every 6 (six) hours as needed.  , Disp: , Rfl: ;  amLODipine (NORVASC) 10 MG tablet, Take 0.5 tablets (5 mg total) by mouth every morning., Disp: 30 tablet, Rfl: 0;  dexamethasone (DECADRON) 4 MG tablet, Take 2 tablets by mouth two times a day starting the day after chemotherapy for 3 days., Disp: 30 tablet, Rfl: 1 finasteride (PROSCAR) 5 MG tablet, Take 5 mg by mouth daily after lunch daily after lunch.  , Disp: , Rfl: ;  levofloxacin (LEVAQUIN) 500 MG tablet, Take 1 tablet (500 mg total) by mouth daily., Disp: 5 tablet, Rfl: 0;  LORazepam (ATIVAN) 0.5 MG tablet, Take 1 tablet (0.5 mg total) by mouth every 6 (six) hours as needed (Nausea or vomiting)., Disp: 30 tablet, Rfl: 0 ondansetron (ZOFRAN) 8 MG tablet, Take 1 tab two times a day starting the day after chemo for 3 days. Then take 1 tab two times a day as needed for nausea or vomiting. , Disp: 30 tablet, Rfl: 1;  prochlorperazine (COMPAZINE) 10 MG tablet, Take 1 tablet (10 mg total) by mouth every 6 (six) hours as needed (Nausea or vomiting)., Disp: 30 tablet, Rfl: 1 prochlorperazine (COMPAZINE) 25 MG suppository, Place 1 suppository (25 mg total) rectally every 12 (twelve) hours as needed for nausea., Disp: 12 suppository, Rfl: 3;  simvastatin (ZOCOR) 80 MG tablet, Take 40 mg by mouth at bedtime.  , Disp: , Rfl:   Allergies:  Allergies  Allergen Reactions  . Morphine And Related Anxiety  . Penicillins Rash  Past Medical History, Surgical history, Social history, and Family History were reviewed and updated.  Review of Systems: Constitutional:  Negative for fever, chills, night sweats, anorexia, weight loss, pain. Cardiovascular: no chest pain or dyspnea on exertion Respiratory: no cough, shortness of breath, or wheezing Neurological: no TIA or stroke symptoms Dermatological: negative ENT: negative Skin:  Negative. Gastrointestinal: no abdominal pain, change in bowel habits, or black or bloody stools Genito-Urinary: no dysuria, trouble voiding, or hematuria Hematological and Lymphatic: negative Breast: negative Musculoskeletal: negative Remaining ROS negative. Physical Exam: Blood pressure 110/67, pulse 101, temperature 98.3 F (36.8 C), temperature source Oral, height 5\' 11"  (1.803 m), weight 215 lb 14.4 oz (97.932 kg). ECOG: 1 General appearance: alert Head: Normocephalic, without obvious abnormality, atraumatic Neck: no adenopathy, no carotid bruit, no JVD, supple, symmetrical, trachea midline and thyroid not enlarged, symmetric, no tenderness/mass/nodules Lymph nodes: Cervical, supraclavicular, and axillary nodes normal. Heart:regular rate and rhythm, S1, S2 normal, no murmur, click, rub or gallop Lung:chest clear, no wheezing, rales, normal symmetric air entry Abdomin: soft, non-tender, without masses or organomegaly EXT:no erythema, induration, or nodules   Lab Results: Lab Results  Component Value Date   WBC 4.7 08/26/2011   HGB 10.7* 08/26/2011   HCT 32.8* 08/26/2011   MCV 83.0 08/26/2011   PLT 560* 08/26/2011     Chemistry      Component Value Date/Time   NA 134* 08/20/2011 0315   K 4.2 08/20/2011 0315   CL 101 08/20/2011 0315   CO2 26 08/20/2011 0315   BUN 17 08/20/2011 0315   CREATININE 1.20 08/20/2011 0315      Component Value Date/Time   CALCIUM 8.6 08/20/2011 0315   ALKPHOS 52 08/17/2011 0350   AST 14 08/17/2011 0350   ALT 16 08/17/2011 0350   BILITOT 0.4 08/17/2011 0350        Impression and Plan:This is a pleasant 78 year old gentleman with the  following issues:  1. 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 He had a documented invasive tumor of the left renal pelvis, status post left nephroureterectomy, now with recurrence into the bladder neck area, biopsy proven to be muscle invasive. He has also had imaging studies that showed possible  lymphadenopathy, as well as lung nodules. PET scan showed positive FDG uptake for malignancy. This is likely represent stage IV disease.  He is ready to proceed with second cycle of chemotherapy. He is medically fit and counts has recovered.   2. History of prostate cancer: not active at this point.  3. Follow up: He will receive day 8 of gemzar on 09/02/2011. I will see him on day one of cycle 3 on 09/16/2011.  4. Fever: due to UTI which has resolved at this time.      [facility], MD 1/10/20139:23 AM

## 2011-08-26 NOTE — Telephone Encounter (Signed)
appts made for 1/17 1/31 and 2/7,card to pt      aom

## 2011-08-27 ENCOUNTER — Other Ambulatory Visit: Payer: Self-pay | Admitting: Radiology

## 2011-08-27 ENCOUNTER — Other Ambulatory Visit: Payer: Self-pay | Admitting: Certified Registered Nurse Anesthetist

## 2011-09-01 ENCOUNTER — Other Ambulatory Visit: Payer: Self-pay | Admitting: Oncology

## 2011-09-01 ENCOUNTER — Ambulatory Visit (HOSPITAL_COMMUNITY)
Admission: RE | Admit: 2011-09-01 | Discharge: 2011-09-01 | Disposition: A | Discharge status: Home / Self Care | Payer: Medicare Other | Source: Ambulatory Visit | Attending: Oncology | Admitting: Oncology

## 2011-09-01 DIAGNOSIS — D649 Anemia, unspecified: Secondary | ICD-10-CM | POA: Insufficient documentation

## 2011-09-01 DIAGNOSIS — C649 Malignant neoplasm of unspecified kidney, except renal pelvis: Secondary | ICD-10-CM | POA: Insufficient documentation

## 2011-09-01 DIAGNOSIS — I129 Hypertensive chronic kidney disease with stage 1 through stage 4 chronic kidney disease, or unspecified chronic kidney disease: Secondary | ICD-10-CM | POA: Insufficient documentation

## 2011-09-01 DIAGNOSIS — Z79899 Other long term (current) drug therapy: Secondary | ICD-10-CM | POA: Insufficient documentation

## 2011-09-01 DIAGNOSIS — C801 Malignant (primary) neoplasm, unspecified: Secondary | ICD-10-CM | POA: Insufficient documentation

## 2011-09-01 DIAGNOSIS — N189 Chronic kidney disease, unspecified: Secondary | ICD-10-CM | POA: Insufficient documentation

## 2011-09-01 DIAGNOSIS — C7800 Secondary malignant neoplasm of unspecified lung: Secondary | ICD-10-CM

## 2011-09-01 LAB — BASIC METABOLIC PANEL
BUN: 18 mg/dL (ref 6–23)
Calcium: 9.3 mg/dL (ref 8.4–10.5)
Creatinine, Ser: 1.07 mg/dL (ref 0.50–1.35)
GFR calc Af Amer: 73 mL/min — ABNORMAL LOW (ref >90)
GFR calc non Af Amer: 63 mL/min — ABNORMAL LOW (ref >90)

## 2011-09-01 LAB — CBC
MCHC: 32.6 g/dL (ref 30.0–36.0)
Platelets: 314 10*3/uL (ref 150–400)
RDW: 15.4 % (ref 11.5–15.5)

## 2011-09-01 LAB — PROTIME-INR
INR: 1.02 (ref 0.00–1.49)
Prothrombin Time: 13.6 seconds (ref 11.6–15.2)

## 2011-09-01 LAB — APTT: aPTT: 29 seconds (ref 24–37)

## 2011-09-01 MED ORDER — FENTANYL CITRATE 0.05 MG/ML IJ SOLN
INTRAMUSCULAR | Status: AC | PRN
  Administered 2011-09-01: 100 ug via INTRAVENOUS

## 2011-09-01 MED ORDER — SODIUM CHLORIDE 0.9 % IV SOLN: INTRAVENOUS | Status: DC

## 2011-09-01 MED ORDER — VANCOMYCIN HCL IN DEXTROSE 1-5 GM/200ML-% IV SOLN
1000.0000 mg | INTRAVENOUS | Status: AC
  Administered 2011-09-01: 1000 mg via INTRAVENOUS
  Filled 2011-09-01: qty 200

## 2011-09-01 MED ORDER — HEPARIN SOD (PORK) LOCK FLUSH 100 UNIT/ML IV SOLN
500.0000 [IU] | Freq: Once | INTRAVENOUS | Status: AC
  Administered 2011-09-01: 500 [IU] via INTRAVENOUS

## 2011-09-01 MED ORDER — MIDAZOLAM HCL 5 MG/5ML IJ SOLN
INTRAMUSCULAR | Status: AC | PRN
  Administered 2011-09-01: 2 mg via INTRAVENOUS

## 2011-09-01 NOTE — H&P (Signed)
Agree with PA note. 

## 2011-09-01 NOTE — Discharge Instructions (Signed)

## 2011-09-01 NOTE — Procedures (Addendum)
Indication: Transitional cell cancer.  Metastatic disease. Placement of right IJ port.  Tip in SVC.  Ready to use.

## 2011-09-01 NOTE — H&P (Signed)
[name] is an 78 y.o. male.   Chief Complaint: recurrence of cancer in need of further chemotherapy.  Poor IV access HPI: Seen by oncology - invasive tumor found on imaging - in need of further chemotherapy.   Past Medical History  Diagnosis Date  . Hypertension   . Chronic kidney disease 06/15/11    bladder cancer  . Blood transfusion   . Arthritis   . Cancer     bladder cancer, hx prostate cancer  . Anemia 08/16/2011    Past Surgical History  Procedure Date  . Colon surgery   . Appendectomy   . Joint replacement   . Vascular surgery     aortic anurysm repair  . Nephrectomy     left  . Arcuate keratectomy   . Bladder surgery     Bx of bladder x4  . Prostate biopsy     Prostate seeds/radiation  . Cystoscopy 06/21/2011    Procedure: CYSTOSCOPY;  Surgeon: Crecencio Mc, MD;  Location: [facility];  Service: Urology;  Laterality: N/A;  . Transurethral resection of bladder tumor 06/21/2011    Procedure: TRANSURETHRAL RESECTION OF BLADDER TUMOR (TURBT);  Surgeon: Crecencio Mc, MD;  Location: [facility];  Service: Urology;  Laterality: N/A;  . Transurethral resection of prostate 06/21/2011    Procedure: TRANSURETHRAL RESECTION OF THE PROSTATE (TURP);  Surgeon: Crecencio Mc, MD;  Location: [facility];  Service: Urology;  Laterality: N/A;  . Cystoscopy with urethral dilatation 06/21/2011    Procedure: CYSTOSCOPY WITH URETHRAL DILATATION;  Surgeon: Crecencio Mc, MD;  Location: [facility];  Service: Urology;  Laterality: N/A;    Social History:  reports that he quit smoking about 28 years ago. His smoking use included Cigarettes. He has never used smokeless tobacco. He reports that he does not drink alcohol. His drug history not on file.  Allergies:  Allergies  Allergen Reactions  . Morphine And Related Anxiety  . Penicillins Rash    Medications Prior to Admission  Medication Sig Dispense Refill  . acetaminophen (TYLENOL) 500 MG tablet Take 500-1,000 mg by mouth every 6 (six) hours as needed.         Marland Kitchen amLODipine (NORVASC) 10 MG tablet Take 0.5 tablets (5 mg total) by mouth every morning.  30 tablet  0  . dexamethasone (DECADRON) 4 MG tablet Take 2 tablets by mouth two times a day starting the day after chemotherapy for 3 days.  30 tablet  1  . finasteride (PROSCAR) 5 MG tablet Take 5 mg by mouth daily after lunch daily after lunch.        Marland Kitchen LORazepam (ATIVAN) 0.5 MG tablet Take 1 tablet (0.5 mg total) by mouth every 6 (six) hours as needed (Nausea or vomiting).  30 tablet  0  . ondansetron (ZOFRAN) 8 MG tablet Take 1 tab two times a day starting the day after chemo for 3 days. Then take 1 tab two times a day as needed for nausea or vomiting.   30 tablet  1  . prochlorperazine (COMPAZINE) 10 MG tablet Take 1 tablet (10 mg total) by mouth every 6 (six) hours as needed (Nausea or vomiting).  30 tablet  1  . prochlorperazine (COMPAZINE) 25 MG suppository Place 1 suppository (25 mg total) rectally every 12 (twelve) hours as needed for nausea.  12 suppository  3  . simvastatin (ZOCOR) 80 MG tablet Take 40 mg by mouth at bedtime.         Medications Prior to Admission  Medication  Dose Route Frequency Provider Last Rate Last Dose  . 0.9 %  sodium chloride infusion   Intravenous Continuous Robet Leu, PA      . vancomycin (VANCOCIN) IVPB 1000 mg/200 mL premix  1,000 mg Intravenous to XRAY Robet Leu, PA        Results for orders placed during the hospital encounter of 09/01/11 (from the past 48 hour(s))  APTT     Status: Normal   Collection Time   09/01/11 12:50 PM      Component Value Range Comment   aPTT 29  24 - 37 (seconds)   BASIC METABOLIC PANEL     Status: Abnormal   Collection Time   09/01/11 12:50 PM      Component Value Range Comment   Sodium 135  135 - 145 (mEq/L)    Potassium 4.4  3.5 - 5.1 (mEq/L)    Chloride 100  96 - 112 (mEq/L)    CO2 25  19 - 32 (mEq/L)    Glucose, Bld 93  70 - 99 (mg/dL)    BUN 18  6 - 23 (mg/dL)    Creatinine, Ser 1.61  0.50 - 1.35 (mg/dL)      Calcium 9.3  8.4 - 10.5 (mg/dL)    GFR calc non Af Amer 63 (*) >90 (mL/min)    GFR calc Af Amer 73 (*) >90 (mL/min)   CBC     Status: Abnormal   Collection Time   09/01/11 12:50 PM      Component Value Range Comment   WBC 2.2 (*) 4.0 - 10.5 (K/uL)    RBC 3.79 (*) 4.22 - 5.81 (MIL/uL)    Hemoglobin 10.3 (*) 13.0 - 17.0 (g/dL)    HCT 09.6 (*) 04.5 - 52.0 (%)    MCV 83.4  78.0 - 100.0 (fL)    MCH 27.2  26.0 - 34.0 (pg)    MCHC 32.6  30.0 - 36.0 (g/dL)    RDW 40.9  81.1 - 91.4 (%)    Platelets 314  150 - 400 (K/uL)   PROTIME-INR     Status: Normal   Collection Time   09/01/11 12:50 PM      Component Value Range Comment   Prothrombin Time 13.6  11.6 - 15.2 (seconds)    INR 1.02  0.00 - 1.49      Review of Systems  Constitutional: Negative for fever and chills.  Respiratory: Negative for cough, hemoptysis and shortness of breath.   Cardiovascular: Negative for chest pain and palpitations.  Gastrointestinal: Negative for abdominal pain.  Genitourinary: Negative for hematuria and flank pain.  Neurological: Negative for weakness.  Psychiatric/Behavioral: Negative.     Blood pressure 116/72, pulse 85, temperature 98.7 F (37.1 C), temperature source Oral, resp. rate 16, height 5\' 11"  (1.803 m), weight 215 lb (97.523 kg), SpO2 96.00%. Physical Exam  Constitutional: He is oriented to person, place, and time. He appears well-developed and well-nourished.  Cardiovascular: Normal rate and regular rhythm.  Exam reveals no gallop and no friction rub.   No murmur heard. Respiratory: Effort normal and breath sounds normal. He has no wheezes. He has no rales.  GI: Bowel sounds are normal. He exhibits no distension. There is no tenderness.  Musculoskeletal: Normal range of motion. He exhibits no edema.  Neurological: He is alert and oriented to person, place, and time.  Skin: Skin is warm and dry.     Assessment/Plan Patient presents today for port placement for chemotherapy access.  Procedure details, benefits and potential complications discussed including but not limited to infection, bleeding, vessel damage, malfunctioning port and complications with moderate sedation.  Patient verbalized his understanding and written consent obtained.   Beckam Abdulaziz D 09/01/2011, 2:01 PM

## 2011-09-02 ENCOUNTER — Other Ambulatory Visit (HOSPITAL_BASED_OUTPATIENT_CLINIC_OR_DEPARTMENT_OTHER): Payer: Medicare Other | Admitting: Lab

## 2011-09-02 ENCOUNTER — Ambulatory Visit (HOSPITAL_BASED_OUTPATIENT_CLINIC_OR_DEPARTMENT_OTHER): Payer: Medicare Other

## 2011-09-02 VITALS — BP 120/77 | HR 83 | Temp 98.4°F

## 2011-09-02 DIAGNOSIS — C7800 Secondary malignant neoplasm of unspecified lung: Secondary | ICD-10-CM

## 2011-09-02 DIAGNOSIS — C649 Malignant neoplasm of unspecified kidney, except renal pelvis: Secondary | ICD-10-CM

## 2011-09-02 DIAGNOSIS — C801 Malignant (primary) neoplasm, unspecified: Secondary | ICD-10-CM

## 2011-09-02 DIAGNOSIS — C675 Malignant neoplasm of bladder neck: Secondary | ICD-10-CM

## 2011-09-02 DIAGNOSIS — Z5111 Encounter for antineoplastic chemotherapy: Secondary | ICD-10-CM

## 2011-09-02 LAB — CBC WITH DIFFERENTIAL/PLATELET
BASO%: 3.7 % — ABNORMAL HIGH (ref 0.0–2.0)
EOS%: 0.3 % (ref 0.0–7.0)
HCT: 30.9 % — ABNORMAL LOW (ref 38.4–49.9)
MCH: 26.9 pg — ABNORMAL LOW (ref 27.2–33.4)
MCHC: 32.7 g/dL (ref 32.0–36.0)
NEUT%: 60 % (ref 39.0–75.0)
RBC: 3.76 10*6/uL — ABNORMAL LOW (ref 4.20–5.82)
RDW: 15.4 % — ABNORMAL HIGH (ref 11.0–14.6)
lymph#: 1 10*3/uL (ref 0.9–3.3)
nRBC: 0 % (ref 0–0)

## 2011-09-02 LAB — COMPREHENSIVE METABOLIC PANEL
AST: 24 U/L (ref 0–37)
Albumin: 3.6 g/dL (ref 3.5–5.2)
Alkaline Phosphatase: 63 U/L (ref 39–117)
BUN: 18 mg/dL (ref 6–23)
Potassium: 4.5 mEq/L (ref 3.5–5.3)

## 2011-09-02 MED ORDER — SODIUM CHLORIDE 0.9 % IJ SOLN
10.0000 mL | INTRAMUSCULAR | Status: DC | PRN
  Administered 2011-09-02: 10 mL
  Filled 2011-09-02: qty 10

## 2011-09-02 MED ORDER — SODIUM CHLORIDE 0.9 % IV SOLN
Freq: Once | INTRAVENOUS | Status: AC
  Administered 2011-09-02: 10:00:00 via INTRAVENOUS

## 2011-09-02 MED ORDER — HEPARIN SOD (PORK) LOCK FLUSH 100 UNIT/ML IV SOLN
500.0000 [IU] | Freq: Once | INTRAVENOUS | Status: AC | PRN
  Administered 2011-09-02: 500 [IU]
  Filled 2011-09-02: qty 5

## 2011-09-02 MED ORDER — SODIUM CHLORIDE 0.9 % IV SOLN
1000.0000 mg/m2 | Freq: Once | INTRAVENOUS | Status: AC
  Administered 2011-09-02: 2242 mg via INTRAVENOUS
  Filled 2011-09-02: qty 59

## 2011-09-02 NOTE — Patient Instructions (Signed)
Patient tolerated treatment well. No adverse events noted. Patient ambulatory to lobby with spouse at time of discharge.

## 2011-09-16 ENCOUNTER — Other Ambulatory Visit (HOSPITAL_BASED_OUTPATIENT_CLINIC_OR_DEPARTMENT_OTHER): Payer: Medicare Other | Admitting: Lab

## 2011-09-16 ENCOUNTER — Ambulatory Visit (HOSPITAL_BASED_OUTPATIENT_CLINIC_OR_DEPARTMENT_OTHER): Payer: Medicare Other

## 2011-09-16 ENCOUNTER — Encounter: Payer: Self-pay | Admitting: *Deleted

## 2011-09-16 ENCOUNTER — Telehealth: Payer: Self-pay | Admitting: Oncology

## 2011-09-16 ENCOUNTER — Ambulatory Visit (HOSPITAL_BASED_OUTPATIENT_CLINIC_OR_DEPARTMENT_OTHER): Payer: Medicare Other | Admitting: Oncology

## 2011-09-16 VITALS — BP 118/76 | HR 100 | Temp 97.6°F | Ht 71.0 in | Wt 220.9 lb

## 2011-09-16 DIAGNOSIS — C649 Malignant neoplasm of unspecified kidney, except renal pelvis: Secondary | ICD-10-CM

## 2011-09-16 DIAGNOSIS — E785 Hyperlipidemia, unspecified: Secondary | ICD-10-CM

## 2011-09-16 DIAGNOSIS — N189 Chronic kidney disease, unspecified: Secondary | ICD-10-CM

## 2011-09-16 DIAGNOSIS — C7800 Secondary malignant neoplasm of unspecified lung: Secondary | ICD-10-CM

## 2011-09-16 DIAGNOSIS — T451X5A Adverse effect of antineoplastic and immunosuppressive drugs, initial encounter: Secondary | ICD-10-CM

## 2011-09-16 DIAGNOSIS — C675 Malignant neoplasm of bladder neck: Secondary | ICD-10-CM

## 2011-09-16 DIAGNOSIS — D649 Anemia, unspecified: Secondary | ICD-10-CM

## 2011-09-16 DIAGNOSIS — C659 Malignant neoplasm of unspecified renal pelvis: Secondary | ICD-10-CM

## 2011-09-16 DIAGNOSIS — C801 Malignant (primary) neoplasm, unspecified: Secondary | ICD-10-CM

## 2011-09-16 DIAGNOSIS — C61 Malignant neoplasm of prostate: Secondary | ICD-10-CM

## 2011-09-16 DIAGNOSIS — N390 Urinary tract infection, site not specified: Secondary | ICD-10-CM

## 2011-09-16 DIAGNOSIS — T83511A Infection and inflammatory reaction due to indwelling urethral catheter, initial encounter: Secondary | ICD-10-CM

## 2011-09-16 DIAGNOSIS — D696 Thrombocytopenia, unspecified: Secondary | ICD-10-CM

## 2011-09-16 DIAGNOSIS — I10 Essential (primary) hypertension: Secondary | ICD-10-CM

## 2011-09-16 DIAGNOSIS — Z5111 Encounter for antineoplastic chemotherapy: Secondary | ICD-10-CM

## 2011-09-16 DIAGNOSIS — Z8546 Personal history of malignant neoplasm of prostate: Secondary | ICD-10-CM

## 2011-09-16 DIAGNOSIS — D6481 Anemia due to antineoplastic chemotherapy: Secondary | ICD-10-CM

## 2011-09-16 DIAGNOSIS — R509 Fever, unspecified: Secondary | ICD-10-CM

## 2011-09-16 LAB — CBC WITH DIFFERENTIAL/PLATELET
Basophils Absolute: 0.1 10*3/uL (ref 0.0–0.1)
Eosinophils Absolute: 0.1 10*3/uL (ref 0.0–0.5)
HGB: 9.7 g/dL — ABNORMAL LOW (ref 13.0–17.1)
LYMPH%: 22.7 % (ref 14.0–49.0)
MCV: 84.1 fL (ref 79.3–98.0)
MONO%: 22.5 % — ABNORMAL HIGH (ref 0.0–14.0)
NEUT#: 2.6 10*3/uL (ref 1.5–6.5)
Platelets: 336 10*3/uL (ref 140–400)

## 2011-09-16 LAB — COMPREHENSIVE METABOLIC PANEL
CO2: 24 mEq/L (ref 19–32)
Creatinine, Ser: 1.17 mg/dL (ref 0.50–1.35)
Glucose, Bld: 95 mg/dL (ref 70–99)
Total Bilirubin: 0.3 mg/dL (ref 0.3–1.2)

## 2011-09-16 LAB — TECHNOLOGIST REVIEW

## 2011-09-16 MED ORDER — HEPARIN SOD (PORK) LOCK FLUSH 100 UNIT/ML IV SOLN
500.0000 [IU] | Freq: Once | INTRAVENOUS | Status: AC | PRN
  Administered 2011-09-16: 500 [IU]
  Filled 2011-09-16: qty 5

## 2011-09-16 MED ORDER — SODIUM CHLORIDE 0.9 % IV SOLN
1000.0000 mg/m2 | Freq: Once | INTRAVENOUS | Status: AC
  Administered 2011-09-16: 2242 mg via INTRAVENOUS
  Filled 2011-09-16: qty 59

## 2011-09-16 MED ORDER — SODIUM CHLORIDE 0.9 % IJ SOLN
10.0000 mL | INTRAMUSCULAR | Status: DC | PRN
  Administered 2011-09-16: 10 mL
  Filled 2011-09-16: qty 10

## 2011-09-16 MED ORDER — SODIUM CHLORIDE 0.9 % IV SOLN
471.0000 mg | Freq: Once | INTRAVENOUS | Status: AC
  Administered 2011-09-16: 470 mg via INTRAVENOUS
  Filled 2011-09-16: qty 47

## 2011-09-16 MED ORDER — ONDANSETRON 16 MG/50ML IVPB (CHCC)
16.0000 mg | Freq: Once | INTRAVENOUS | Status: AC
  Administered 2011-09-16: 16 mg via INTRAVENOUS

## 2011-09-16 MED ORDER — DEXAMETHASONE SODIUM PHOSPHATE 4 MG/ML IJ SOLN
20.0000 mg | Freq: Once | INTRAMUSCULAR | Status: AC
  Administered 2011-09-16: 20 mg via INTRAVENOUS

## 2011-09-16 MED ORDER — SODIUM CHLORIDE 0.9 % IV SOLN
Freq: Once | INTRAVENOUS | Status: AC
  Administered 2011-09-16: 14:00:00 via INTRAVENOUS

## 2011-09-16 MED ORDER — DARBEPOETIN ALFA-POLYSORBATE 200 MCG/0.4ML IJ SOLN
200.0000 ug | Freq: Once | INTRAMUSCULAR | Status: AC
  Administered 2011-09-16: 200 ug via SUBCUTANEOUS
  Filled 2011-09-16: qty 0.4

## 2011-09-16 NOTE — Progress Notes (Signed)
Hematology and Oncology Follow Up Visit  NIKOLAY DEMETRIOU [id number] Jul 02, 1927 78 y.o. 09/16/2011 1:37 PM   CC: [name], MD  [name], MD   Principle Diagnosis: This is a pleasant 78 year old gentleman with the following issues:  1. 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 Now has metastatic disease with lung nodules. 2. Prostate cancer: He is s/p treatment with brachytherapy in December 2002. He has had no evidence for cancer recurrence since treatment.    Prior Therapy: He underwent an open left nephroureterectomy done on June 04, 2010. His pathology from that showed [phone], showed that he had a papillary invasive high-grade urothelial carcinoma with negative urinary bladder cuff margins. The tumor was 3.8 cm. No lymphovascular invasion. Again, was a T2 NX disease.   Current therapy: Patient to start day 1, cycle 3 of chemotherapy of Carboplatin and Gemzar.  Interim History: Mr. Haskew presents for a follow up visit since his last visit to start the third cycle of chemotherapy. He tolerated chemotherapy well, but did develop a fever and did not required hospitalization. He is really asymptomatic at this time. He is no longer reporting any fever. He did not report any abdominal pain. He had not reported any chest pain. Had not reported any hematuria. Had not reported any flank pain. Had not reported really any major changes in his performance status or activity level at this time. He has continued to perform activities of daily living without any hindrance or decline. He has continued to drive and attends to again activities of daily living without problems at this point. He still has a foley catheter in place. He is ready to proceed with his third cycle of chemotherapy. No complications related to chemotherapy noted.   Medications: I have reviewed the patient's current medications.  Current outpatient prescriptions:acetaminophen (TYLENOL) 500 MG tablet, Take 500-1,000 mg by mouth every 6 (six) hours as needed.  , Disp: , Rfl: ;  amLODipine (NORVASC) 10 MG tablet, Take 0.5 tablets (5 mg total) by mouth every morning., Disp: 30 tablet, Rfl: 0;  dexamethasone (DECADRON) 4 MG tablet, Take 2 tablets by mouth two times a day starting the day after chemotherapy for 3 days., Disp: 30 tablet, Rfl: 1 finasteride (PROSCAR) 5 MG tablet, Take 5 mg by mouth daily after lunch daily after lunch.  , Disp: , Rfl: ;  LORazepam (ATIVAN) 0.5 MG tablet, Take 1 tablet (0.5 mg total) by mouth every 6 (six) hours as needed (Nausea or vomiting)., Disp: 30 tablet, Rfl: 0 ondansetron (ZOFRAN) 8 MG tablet, Take 1 tab two times a day starting the day after chemo for 3 days. Then take 1 tab two times a day as needed for nausea or vomiting. , Disp: 30 tablet, Rfl: 1;  prochlorperazine (COMPAZINE) 10 MG tablet, Take 1 tablet (10 mg total) by mouth every 6 (six) hours as needed (Nausea or vomiting)., Disp: 30 tablet, Rfl: 1 prochlorperazine (COMPAZINE) 25 MG suppository, Place 1 suppository (25 mg total) rectally every 12 (twelve) hours as needed for nausea., Disp: 12 suppository, Rfl: 3;  simvastatin (ZOCOR) 80 MG tablet, Take 40 mg by mouth at bedtime.  , Disp: , Rfl:   Allergies:  Allergies  Allergen Reactions  . Morphine And Related Anxiety  . Penicillins Rash    Past Medical History, Surgical history, Social history, and Family History were reviewed and updated.  Review of Systems: Constitutional:  Negative for fever, chills,  night sweats, anorexia, weight loss, pain. Cardiovascular: no chest pain or dyspnea on exertion Respiratory: no cough, shortness of breath, or wheezing Neurological: no TIA or stroke symptoms Dermatological: negative ENT: negative Skin: Negative. Gastrointestinal: no abdominal pain, change in bowel habits, or black or bloody stools Genito-Urinary: no dysuria, trouble voiding, or  hematuria Hematological and Lymphatic: negative Breast: negative Musculoskeletal: negative Remaining ROS negative. Physical Exam: Blood pressure 118/76, pulse 100, temperature 97.6 F (36.4 C), temperature source Oral, height 5\' 11"  (1.803 m), weight 220 lb 14.4 oz (100.2 kg). ECOG: 1 General appearance: alert Head: Normocephalic, without obvious abnormality, atraumatic Neck: no adenopathy, no carotid bruit, no JVD, supple, symmetrical, trachea midline and thyroid not enlarged, symmetric, no tenderness/mass/nodules Lymph nodes: Cervical, supraclavicular, and axillary nodes normal. Heart:regular rate and rhythm, S1, S2 normal, no murmur, click, rub or gallop Lung:chest clear, no wheezing, rales, normal symmetric air entry Abdomin: soft, non-tender, without masses or organomegaly EXT:no erythema, induration, or nodules   Lab Results: Lab Results  Component Value Date   WBC 5.1 09/16/2011   HGB 9.7* 09/16/2011   HCT 29.6* 09/16/2011   MCV 84.1 09/16/2011   PLT 336 09/16/2011     Chemistry      Component Value Date/Time   NA 136 09/02/2011 0914   K 4.5 09/02/2011 0914   CL 101 09/02/2011 0914   CO2 25 09/02/2011 0914   BUN 18 09/02/2011 0914   CREATININE 1.07 09/02/2011 0914      Component Value Date/Time   CALCIUM 8.5 09/02/2011 0914   ALKPHOS 63 09/02/2011 0914   AST 24 09/02/2011 0914   ALT 19 09/02/2011 0914   BILITOT 0.5 09/02/2011 0914        Impression and Plan:This is a pleasant 78 year old gentleman with the  following issues:  1. 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 He had a documented invasive tumor of the left renal pelvis, status post left nephroureterectomy, now with recurrence into the bladder neck area, biopsy proven to be muscle invasive. He has also had imaging studies that showed possible lymphadenopathy, as well as lung nodules. PET scan showed positive FDG uptake for malignancy. This is likely represent stage IV disease.  He is ready  to proceed with third cycle of chemotherapy. He is medically fit and counts has recovered. I will restage him with CT scan after this cycle.  2. History of prostate cancer: not active at this point.  3. Follow up: He will receive day 8 of gemzar on 09/23/2011. I will see him on day one of cycle 4 on 10/07/2011 after CT scan  4. Fever: This has resolved now.  5. Anemia: This is related to chemotherapy and cancer. The plan is to start Aransep. Risks and benefits  Discussed, medication guide was given to the patient and was discussed today as well.      Eli Hose, MD 1/31/20131:37 PM

## 2011-09-16 NOTE — Telephone Encounter (Signed)
appts made and printed for 2/14 2/21 and 2/28   aom

## 2011-09-16 NOTE — Patient Instructions (Signed)
Call as needed for any questions, side effects or symptoms.  Drink lots of water.

## 2011-09-17 ENCOUNTER — Encounter: Payer: Self-pay | Admitting: *Deleted

## 2011-09-23 ENCOUNTER — Other Ambulatory Visit (HOSPITAL_BASED_OUTPATIENT_CLINIC_OR_DEPARTMENT_OTHER): Payer: Medicare Other | Admitting: Lab

## 2011-09-23 ENCOUNTER — Ambulatory Visit (HOSPITAL_BASED_OUTPATIENT_CLINIC_OR_DEPARTMENT_OTHER): Payer: Medicare Other

## 2011-09-23 VITALS — BP 128/73 | HR 103 | Temp 97.7°F

## 2011-09-23 DIAGNOSIS — C61 Malignant neoplasm of prostate: Secondary | ICD-10-CM

## 2011-09-23 DIAGNOSIS — C675 Malignant neoplasm of bladder neck: Secondary | ICD-10-CM

## 2011-09-23 DIAGNOSIS — C801 Malignant (primary) neoplasm, unspecified: Secondary | ICD-10-CM

## 2011-09-23 DIAGNOSIS — C7800 Secondary malignant neoplasm of unspecified lung: Secondary | ICD-10-CM

## 2011-09-23 DIAGNOSIS — C649 Malignant neoplasm of unspecified kidney, except renal pelvis: Secondary | ICD-10-CM

## 2011-09-23 DIAGNOSIS — Z5111 Encounter for antineoplastic chemotherapy: Secondary | ICD-10-CM

## 2011-09-23 LAB — COMPREHENSIVE METABOLIC PANEL
ALT: 19 U/L (ref 0–53)
Albumin: 3.7 g/dL (ref 3.5–5.2)
CO2: 22 mEq/L (ref 19–32)
Chloride: 101 mEq/L (ref 96–112)
Glucose, Bld: 104 mg/dL — ABNORMAL HIGH (ref 70–99)
Potassium: 4.4 mEq/L (ref 3.5–5.3)
Sodium: 133 mEq/L — ABNORMAL LOW (ref 135–145)
Total Bilirubin: 0.6 mg/dL (ref 0.3–1.2)
Total Protein: 6.1 g/dL (ref 6.0–8.3)

## 2011-09-23 LAB — CBC WITH DIFFERENTIAL/PLATELET
Basophils Absolute: 0.1 10*3/uL (ref 0.0–0.1)
Eosinophils Absolute: 0 10*3/uL (ref 0.0–0.5)
HGB: 9.3 g/dL — ABNORMAL LOW (ref 13.0–17.1)
MCV: 83.3 fL (ref 79.3–98.0)
MONO#: 0.1 10*3/uL (ref 0.1–0.9)
NEUT#: 1.2 10*3/uL — ABNORMAL LOW (ref 1.5–6.5)
RBC: 3.36 10*6/uL — ABNORMAL LOW (ref 4.20–5.82)
RDW: 18.2 % — ABNORMAL HIGH (ref 11.0–14.6)
WBC: 2 10*3/uL — ABNORMAL LOW (ref 4.0–10.3)
lymph#: 0.6 10*3/uL — ABNORMAL LOW (ref 0.9–3.3)
nRBC: 0 % (ref 0–0)

## 2011-09-23 MED ORDER — HEPARIN SOD (PORK) LOCK FLUSH 100 UNIT/ML IV SOLN
500.0000 [IU] | Freq: Once | INTRAVENOUS | Status: AC | PRN
  Administered 2011-09-23: 500 [IU]
  Filled 2011-09-23: qty 5

## 2011-09-23 MED ORDER — PROCHLORPERAZINE MALEATE 10 MG PO TABS
10.0000 mg | ORAL_TABLET | Freq: Once | ORAL | Status: AC
  Administered 2011-09-23: 10 mg via ORAL

## 2011-09-23 MED ORDER — SODIUM CHLORIDE 0.9 % IV SOLN
1000.0000 mg/m2 | Freq: Once | INTRAVENOUS | Status: AC
  Administered 2011-09-23: 2242 mg via INTRAVENOUS
  Filled 2011-09-23: qty 59

## 2011-09-23 MED ORDER — SODIUM CHLORIDE 0.9 % IV SOLN
Freq: Once | INTRAVENOUS | Status: AC
  Administered 2011-09-23: 09:00:00 via INTRAVENOUS

## 2011-09-23 MED ORDER — SODIUM CHLORIDE 0.9 % IJ SOLN
10.0000 mL | INTRAMUSCULAR | Status: DC | PRN
  Administered 2011-09-23: 10 mL
  Filled 2011-09-23: qty 10

## 2011-09-23 NOTE — Progress Notes (Signed)
0865 Reported CBC results WBC 2.0 ANC 1.2, OK to treat per Dr. Clelia Croft.

## 2011-09-30 ENCOUNTER — Ambulatory Visit (HOSPITAL_COMMUNITY)
Admission: RE | Admit: 2011-09-30 | Discharge: 2011-09-30 | Disposition: A | Discharge status: Home / Self Care | Payer: Medicare Other | Source: Ambulatory Visit | Attending: Oncology | Admitting: Oncology

## 2011-09-30 ENCOUNTER — Encounter (HOSPITAL_COMMUNITY): Payer: Self-pay

## 2011-09-30 DIAGNOSIS — C649 Malignant neoplasm of unspecified kidney, except renal pelvis: Secondary | ICD-10-CM | POA: Insufficient documentation

## 2011-09-30 DIAGNOSIS — M47814 Spondylosis without myelopathy or radiculopathy, thoracic region: Secondary | ICD-10-CM | POA: Insufficient documentation

## 2011-09-30 DIAGNOSIS — I700 Atherosclerosis of aorta: Secondary | ICD-10-CM | POA: Insufficient documentation

## 2011-09-30 DIAGNOSIS — I2510 Atherosclerotic heart disease of native coronary artery without angina pectoris: Secondary | ICD-10-CM | POA: Insufficient documentation

## 2011-09-30 DIAGNOSIS — K8020 Calculus of gallbladder without cholecystitis without obstruction: Secondary | ICD-10-CM | POA: Insufficient documentation

## 2011-09-30 DIAGNOSIS — R911 Solitary pulmonary nodule: Secondary | ICD-10-CM | POA: Insufficient documentation

## 2011-09-30 MED ORDER — IOHEXOL 300 MG/ML  SOLN
100.0000 mL | Freq: Once | INTRAMUSCULAR | Status: AC | PRN
  Administered 2011-09-30: 100 mL via INTRAVENOUS

## 2011-10-01 ENCOUNTER — Encounter: Payer: Self-pay | Admitting: *Deleted

## 2011-10-07 ENCOUNTER — Ambulatory Visit (HOSPITAL_BASED_OUTPATIENT_CLINIC_OR_DEPARTMENT_OTHER): Payer: Medicare Other

## 2011-10-07 ENCOUNTER — Encounter (HOSPITAL_COMMUNITY)
Admission: RE | Admit: 2011-10-07 | Discharge: 2011-10-07 | Disposition: A | Discharge status: Home / Self Care | Payer: Medicare Other | Source: Ambulatory Visit | Attending: Oncology | Admitting: Oncology

## 2011-10-07 ENCOUNTER — Ambulatory Visit: Payer: Medicare Other | Admitting: Oncology

## 2011-10-07 ENCOUNTER — Other Ambulatory Visit: Payer: Medicare Other | Admitting: Lab

## 2011-10-07 ENCOUNTER — Telehealth: Payer: Self-pay | Admitting: Oncology

## 2011-10-07 VITALS — BP 134/74 | HR 91 | Temp 97.8°F | Ht 71.0 in | Wt 224.6 lb

## 2011-10-07 DIAGNOSIS — D696 Thrombocytopenia, unspecified: Secondary | ICD-10-CM

## 2011-10-07 DIAGNOSIS — C801 Malignant (primary) neoplasm, unspecified: Secondary | ICD-10-CM

## 2011-10-07 DIAGNOSIS — T83511A Infection and inflammatory reaction due to indwelling urethral catheter, initial encounter: Secondary | ICD-10-CM

## 2011-10-07 DIAGNOSIS — R509 Fever, unspecified: Secondary | ICD-10-CM

## 2011-10-07 DIAGNOSIS — Z5111 Encounter for antineoplastic chemotherapy: Secondary | ICD-10-CM

## 2011-10-07 DIAGNOSIS — C7800 Secondary malignant neoplasm of unspecified lung: Secondary | ICD-10-CM

## 2011-10-07 DIAGNOSIS — C649 Malignant neoplasm of unspecified kidney, except renal pelvis: Secondary | ICD-10-CM | POA: Insufficient documentation

## 2011-10-07 DIAGNOSIS — D649 Anemia, unspecified: Secondary | ICD-10-CM

## 2011-10-07 DIAGNOSIS — E785 Hyperlipidemia, unspecified: Secondary | ICD-10-CM | POA: Insufficient documentation

## 2011-10-07 DIAGNOSIS — N189 Chronic kidney disease, unspecified: Secondary | ICD-10-CM

## 2011-10-07 DIAGNOSIS — I10 Essential (primary) hypertension: Secondary | ICD-10-CM

## 2011-10-07 DIAGNOSIS — N390 Urinary tract infection, site not specified: Secondary | ICD-10-CM

## 2011-10-07 DIAGNOSIS — Z8546 Personal history of malignant neoplasm of prostate: Secondary | ICD-10-CM

## 2011-10-07 DIAGNOSIS — I129 Hypertensive chronic kidney disease with stage 1 through stage 4 chronic kidney disease, or unspecified chronic kidney disease: Secondary | ICD-10-CM | POA: Insufficient documentation

## 2011-10-07 LAB — CBC WITH DIFFERENTIAL/PLATELET
BASO%: 2.9 % — ABNORMAL HIGH (ref 0.0–2.0)
EOS%: 2.3 % (ref 0.0–7.0)
Eosinophils Absolute: 0.1 10*3/uL (ref 0.0–0.5)
LYMPH%: 27.1 % (ref 14.0–49.0)
MCH: 27.9 pg (ref 27.2–33.4)
MCHC: 32.7 g/dL (ref 32.0–36.0)
MCV: 85.4 fL (ref 79.3–98.0)
MONO%: 25.4 % — ABNORMAL HIGH (ref 0.0–14.0)
Platelets: 294 10*3/uL (ref 140–400)
RBC: 3.08 10*6/uL — ABNORMAL LOW (ref 4.20–5.82)
nRBC: 1 % — ABNORMAL HIGH (ref 0–0)

## 2011-10-07 LAB — PREPARE RBC (CROSSMATCH)

## 2011-10-07 LAB — COMPREHENSIVE METABOLIC PANEL
Albumin: 3.6 g/dL (ref 3.5–5.2)
Alkaline Phosphatase: 62 U/L (ref 39–117)
BUN: 17 mg/dL (ref 6–23)
CO2: 24 mEq/L (ref 19–32)
Calcium: 8.7 mg/dL (ref 8.4–10.5)
Chloride: 104 mEq/L (ref 96–112)
Glucose, Bld: 91 mg/dL (ref 70–99)
Potassium: 4.4 mEq/L (ref 3.5–5.3)

## 2011-10-07 LAB — TECHNOLOGIST REVIEW

## 2011-10-07 MED ORDER — SODIUM CHLORIDE 0.9 % IV SOLN
Freq: Once | INTRAVENOUS | Status: AC
  Administered 2011-10-07: 10:00:00 via INTRAVENOUS

## 2011-10-07 MED ORDER — DEXAMETHASONE SODIUM PHOSPHATE 4 MG/ML IJ SOLN
20.0000 mg | Freq: Once | INTRAMUSCULAR | Status: AC
  Administered 2011-10-07: 20 mg via INTRAVENOUS

## 2011-10-07 MED ORDER — SODIUM CHLORIDE 0.9 % IJ SOLN
10.0000 mL | INTRAMUSCULAR | Status: DC | PRN
  Administered 2011-10-07: 10 mL
  Filled 2011-10-07: qty 10

## 2011-10-07 MED ORDER — HEPARIN SOD (PORK) LOCK FLUSH 100 UNIT/ML IV SOLN
500.0000 [IU] | Freq: Once | INTRAVENOUS | Status: AC | PRN
  Administered 2011-10-07: 500 [IU]
  Filled 2011-10-07: qty 5

## 2011-10-07 MED ORDER — GEMCITABINE HCL CHEMO INJECTION 1 GM
1000.0000 mg/m2 | Freq: Once | INTRAVENOUS | Status: AC
  Administered 2011-10-07: 2242 mg via INTRAVENOUS
  Filled 2011-10-07: qty 59

## 2011-10-07 MED ORDER — SODIUM CHLORIDE 0.9 % IV SOLN
471.0000 mg | Freq: Once | INTRAVENOUS | Status: AC
  Administered 2011-10-07: 470 mg via INTRAVENOUS
  Filled 2011-10-07: qty 47

## 2011-10-07 MED ORDER — ONDANSETRON 16 MG/50ML IVPB (CHCC)
16.0000 mg | Freq: Once | INTRAVENOUS | Status: AC
  Administered 2011-10-07: 16 mg via INTRAVENOUS

## 2011-10-07 MED ORDER — DARBEPOETIN ALFA-POLYSORBATE 200 MCG/0.4ML IJ SOLN
200.0000 ug | Freq: Once | INTRAMUSCULAR | Status: AC
  Administered 2011-10-07: 200 ug via SUBCUTANEOUS
  Filled 2011-10-07: qty 0.4

## 2011-10-07 NOTE — Progress Notes (Signed)
Hematology and Oncology Follow Up Visit  [name] [id number] 02-03-1927 78 y.o. 10/07/2011 8:57 AM   CC: Brian Purpura, MD  Brian Kindle, MD   Principle Diagnosis: This is a pleasant 78 year old gentleman with the following issues:  1. 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 Now has metastatic disease with lung nodules. 2. Prostate cancer: He is s/p treatment with brachytherapy in December 2002. He has had no evidence for cancer recurrence since treatment.    Prior Therapy: He underwent an open left nephroureterectomy done on June 04, 2010. His pathology from that showed [id number], showed that he had a papillary invasive high-grade urothelial carcinoma with negative urinary bladder cuff margins. The tumor was 3.8 cm. No lymphovascular invasion. Again, was a T2 NX disease.   Current therapy: Patient to start day 1, cycle 4 of chemotherapy of Carboplatin and Gemzar.  Interim History: [name] presents for a follow up visit since his last visit to start the fourth cycle of chemotherapy. He tolerated chemotherapy well, but did develop a fever and did not required hospitalization. He is really asymptomatic at this time. He is no longer reporting any fever. He did not report any abdominal pain. He had not reported any chest pain. Had not reported any hematuria. Had not reported any flank pain. Had not reported really any major changes in his performance status or activity level at this time. He has continued to perform activities of daily living without any hindrance or decline. He has continued to drive and attends to again activities of daily living without problems at this point. He still has a foley catheter in place. He is reporting more fatigue at this time. No chest pain or SOB.    Medications: I have reviewed the patient's current medications. Current outpatient  prescriptions:acetaminophen (TYLENOL) 500 MG tablet, Take 500-1,000 mg by mouth every 6 (six) hours as needed.  , Disp: , Rfl: ;  amLODipine (NORVASC) 10 MG tablet, Take 0.5 tablets (5 mg total) by mouth every morning., Disp: 30 tablet, Rfl: 0;  finasteride (PROSCAR) 5 MG tablet, Take 5 mg by mouth daily after lunch daily after lunch.  , Disp: , Rfl:  ondansetron (ZOFRAN) 8 MG tablet, Take 1 tab two times a day starting the day after chemo for 3 days. Then take 1 tab two times a day as needed for nausea or vomiting. , Disp: 30 tablet, Rfl: 1;  simvastatin (ZOCOR) 80 MG tablet, Take 40 mg by mouth at bedtime.  , Disp: , Rfl:   Allergies:  Allergies  Allergen Reactions  . Morphine And Related Anxiety  . Penicillins Rash    Past Medical History, Surgical history, Social history, and Family History were reviewed and updated.  Review of Systems: Constitutional:  Negative for fever, chills, night sweats, anorexia, weight loss, pain. Cardiovascular: no chest pain or dyspnea on exertion Respiratory: no cough, shortness of breath, or wheezing Neurological: no TIA or stroke symptoms Dermatological: negative ENT: negative Skin: Negative. Gastrointestinal: no abdominal pain, change in bowel habits, or black or bloody stools Genito-Urinary: no dysuria, trouble voiding, or hematuria Hematological and Lymphatic: negative Breast: negative Musculoskeletal: negative Remaining ROS negative. Physical Exam: Blood pressure 134/74, pulse 91, temperature 97.8 F (36.6 C), temperature source Oral, height 5\' 11"  (1.803 m), weight 224 lb 9.6 oz (101.878 kg). ECOG: 1 General appearance: alert Head: Normocephalic, without obvious abnormality, atraumatic Neck: no adenopathy, no carotid bruit, no JVD,  supple, symmetrical, trachea midline and thyroid not enlarged, symmetric, no tenderness/mass/nodules Lymph nodes: Cervical, supraclavicular, and axillary nodes normal. Heart:regular rate and rhythm, S1, S2 normal,  no murmur, click, rub or gallop Lung:chest clear, no wheezing, rales, normal symmetric air entry Abdomin: soft, non-tender, without masses or organomegaly EXT:no erythema, induration, or nodules   Lab Results: Lab Results  Component Value Date   WBC 3.5* 10/07/2011   HGB 8.6* 10/07/2011   HCT 26.3* 10/07/2011   MCV 85.4 10/07/2011   PLT 294 10/07/2011     Chemistry      Component Value Date/Time   NA 133* 09/23/2011 0826   K 4.4 09/23/2011 0826   CL 101 09/23/2011 0826   CO2 22 09/23/2011 0826   BUN 27* 09/23/2011 0826   CREATININE 1.18 09/23/2011 0826      Component Value Date/Time   CALCIUM 8.7 09/23/2011 0826   ALKPHOS 61 09/23/2011 0826   AST 27 09/23/2011 0826   ALT 19 09/23/2011 0826   BILITOT 0.6 09/23/2011 0826     CT CHEST, ABDOMEN AND PELVIS WITH CONTRAST  Technique: Multidetector CT imaging of the chest, abdomen and  pelvis was performed following the standard protocol during bolus  administration of intravenous contrast.  Contrast: OMNIPAQUE IOHEXOL 300 MG/ML IV SOLN  Comparison: 06/30/2011  CT CHEST  Findings: There is no enlarged axillary or supraclavicular lymph  nodes.  No enlarged axillary lymph nodes.  There is no mediastinal or hilar lymph nodes identified.  No pericardial or pleural effusion identified.  Advanced calcified atherosclerotic disease involves the LAD and  left circumflex coronary arteries.  Pulmonary nodule within the basilar segment of the left upper lobe  measures 0.7 cm. This whether this is unchanged in size from  previous exam this does appear less solid.  Nodule within superior segment of the left lower lobe measures 0.5  cm, image 37. Previously this measured 1.1 cm.  No new or enlarging nodules or masses identified. There is  multilevel spondylosis identified within the thoracic spine.  Sclerotic lesion within the proximal right humerus has a  cartilaginous matrix and likely reflects a benign enchondroma.  There is no airspace consolidation  identified.  IMPRESSION:  1. Interval improvement in left lung nodules.  2. There is atherosclerosis of the thoracic aorta, the great  vessels of the mediastinum and the coronary arteries, including  calcified atherosclerotic plaque in the left circumflex and LAD  coronary arteries.  Atherosclerosis, including left circumflex and LAD coronary artery  disease. Please note that although the presence of coronary artery  calcium documents the presence of coronary artery disease, the  severity of this disease and any potential stenosis cannot be  assessed on this non-gated CT examination. Assessment for  potential risk factor modification, dietary therapy or  pharmacologic therapy may be warranted, if clinically indicated.  CT ABDOMEN AND PELVIS  Findings: There are no focal liver abnormalities.  The spleen appears within normal limits.  Both adrenal glands are normal.  Gallbladder contains multiple stones. No cholecystitis.  No biliary ductal dilatation.  The pancreas appears normal. Normal pancreatic duct.  Both adrenal glands are negative.  The spleen appears normal.  Tiny hypodensity within the inferior pole of the right kidney is  stable measuring 0.7 cm, image 81. Prior left nephrectomy.  No upper abdominal adenopathy.  At the level of the aortic bifurcation there is a small lymph node  measuring 8.5 mm, image 94. Previously 1.3 cm.  Left common iliac lymph node measures  5.7 mm, image 98. Previously  7.8 mm.  The stomach and the small bowel loops have a normal course and  caliber.  Extensive diverticular change involves the sigmoid colon. Fat  stranding and fluid is identified within the left lower quadrant of  the abdomen which suggests either early or resolving  diverticulitis. No evidence for bowel perforation or abscess  formation.  Seed implants noted within the prostate gland. The urinary bladder  is collapsed around a Foley catheter.  The left femoral head avascular  necrosis noted. Prior right total  hip arthroplasty.  IMPRESSION:  1. Lower abdominal lymph nodes are decreased from previous exam.  2. No new or progressive disease identified.  3. Left lower quadrant inflammatory changes are suspicious for  either early or resolving acute diverticulitis.    Impression and Plan:This is a pleasant 78 year old gentleman with the  following issues:  1. 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 He had a documented invasive tumor of the left renal pelvis, status post left nephroureterectomy, now with recurrence into the bladder neck area, biopsy proven to be muscle invasive. He has also had imaging studies that showed possible lymphadenopathy, as well as lung nodules. PET scan showed positive FDG uptake for malignancy. This is likely represent stage IV disease.  After three cycles of therapy, his tumor have decreased by 50%. The plan is proceed with 3 more cycles. He is ready to proceed with fourth cycle of chemotherapy. He is medically fit and counts has recovered.   2. History of prostate cancer: not active at this point.  3. Follow up: He will receive day 8 of gemzar on 10/14/2011. I will see him on day one of cycle 5 on 10/28/2011.  4. Fever: This has resolved now.  5. Anemia: This is related to chemotherapy and cancer. The plan is to continue  Aransep 300 mcg every three weeks. I will set him with PRBC transfusion given his symptoms on 2/23.      [facility], MD 2/21/20138:57 AM

## 2011-10-07 NOTE — Patient Instructions (Signed)
[facility] Discharge Instructions for Patients Receiving Chemotherapy  Today you received the following chemotherapy agents Gemzar and Carboplatin.  To help prevent nausea and vomiting after your treatment, we encourage you to take your nausea medication as ordered per MD.    If you develop nausea and vomiting that is not controlled by your nausea medication, call the clinic. If it is after clinic hours your family physician or the after hours number for the clinic or go to the Emergency Department.   BELOW ARE SYMPTOMS THAT SHOULD BE REPORTED IMMEDIATELY:  *FEVER GREATER THAN 100.5 F  *CHILLS WITH OR WITHOUT FEVER  NAUSEA AND VOMITING THAT IS NOT CONTROLLED WITH YOUR NAUSEA MEDICATION  *UNUSUAL SHORTNESS OF BREATH  *UNUSUAL BRUISING OR BLEEDING  TENDERNESS IN MOUTH AND THROAT WITH OR WITHOUT PRESENCE OF ULCERS  *URINARY PROBLEMS  *BOWEL PROBLEMS  UNUSUAL RASH Items with * indicate a potential emergency and should be followed up as soon as possible.   Please let the nurse know about any problems that you may have experienced. Feel free to call the clinic you have any questions or concerns. The clinic phone number is [phone].   I have been informed and understand all the instructions given to me. I know to contact the clinic, my physician, or go to the Emergency Department if any problems should occur. I do not have any questions at this time, but understand that I may call the clinic during office hours   should I have any questions or need assistance in obtaining follow up care.    __________________________________________  _____________  __________ Signature of Patient or Authorized Representative            Date                   Time    __________________________________________ Nurse's Signature    

## 2011-10-07 NOTE — Progress Notes (Signed)
Called admitting and spoke with denean and had a HAR created for patient to receive 2 units p-rbc's on Saturday feb 23rd.

## 2011-10-07 NOTE — Progress Notes (Signed)
1100-Type and crossmatch drawn via [facility] for transfusion on 10/09/11.  Pt instructed to not take blue blood bracelet off prior to transfusion.  Pt verbalizes an understanding to return to [facility] infusion room at 0830 on 10/09/11 for blood transfusion.

## 2011-10-07 NOTE — Telephone Encounter (Signed)
per nurse vanessa gv pt appt for [id number]

## 2011-10-09 ENCOUNTER — Ambulatory Visit (HOSPITAL_BASED_OUTPATIENT_CLINIC_OR_DEPARTMENT_OTHER): Payer: Medicare Other

## 2011-10-09 DIAGNOSIS — C801 Malignant (primary) neoplasm, unspecified: Secondary | ICD-10-CM

## 2011-10-09 DIAGNOSIS — E785 Hyperlipidemia, unspecified: Secondary | ICD-10-CM

## 2011-10-09 DIAGNOSIS — C7800 Secondary malignant neoplasm of unspecified lung: Secondary | ICD-10-CM

## 2011-10-09 DIAGNOSIS — Z8546 Personal history of malignant neoplasm of prostate: Secondary | ICD-10-CM

## 2011-10-09 DIAGNOSIS — C649 Malignant neoplasm of unspecified kidney, except renal pelvis: Secondary | ICD-10-CM

## 2011-10-09 DIAGNOSIS — N390 Urinary tract infection, site not specified: Secondary | ICD-10-CM

## 2011-10-09 DIAGNOSIS — D696 Thrombocytopenia, unspecified: Secondary | ICD-10-CM

## 2011-10-09 DIAGNOSIS — T83511A Infection and inflammatory reaction due to indwelling urethral catheter, initial encounter: Secondary | ICD-10-CM

## 2011-10-09 DIAGNOSIS — D649 Anemia, unspecified: Secondary | ICD-10-CM

## 2011-10-09 DIAGNOSIS — R509 Fever, unspecified: Secondary | ICD-10-CM

## 2011-10-09 DIAGNOSIS — N189 Chronic kidney disease, unspecified: Secondary | ICD-10-CM

## 2011-10-09 DIAGNOSIS — I10 Essential (primary) hypertension: Secondary | ICD-10-CM

## 2011-10-09 MED ORDER — DIPHENHYDRAMINE HCL 50 MG/ML IJ SOLN
25.0000 mg | Freq: Once | INTRAMUSCULAR | Status: AC
  Administered 2011-10-09: 25 mg via INTRAVENOUS

## 2011-10-09 MED ORDER — ACETAMINOPHEN 325 MG PO TABS
650.0000 mg | ORAL_TABLET | Freq: Once | ORAL | Status: AC
  Administered 2011-10-09: 650 mg via ORAL

## 2011-10-09 NOTE — Patient Instructions (Signed)
Patient aware of next appointment.

## 2011-10-11 LAB — TYPE AND SCREEN
ABO/RH(D): A POS
Unit division: 0

## 2011-10-14 ENCOUNTER — Other Ambulatory Visit: Payer: Medicare Other | Admitting: Lab

## 2011-10-14 ENCOUNTER — Ambulatory Visit (HOSPITAL_BASED_OUTPATIENT_CLINIC_OR_DEPARTMENT_OTHER): Payer: Medicare Other

## 2011-10-14 VITALS — BP 136/77 | HR 90 | Temp 99.0°F

## 2011-10-14 DIAGNOSIS — C801 Malignant (primary) neoplasm, unspecified: Secondary | ICD-10-CM

## 2011-10-14 DIAGNOSIS — Z5111 Encounter for antineoplastic chemotherapy: Secondary | ICD-10-CM

## 2011-10-14 DIAGNOSIS — C649 Malignant neoplasm of unspecified kidney, except renal pelvis: Secondary | ICD-10-CM

## 2011-10-14 LAB — COMPREHENSIVE METABOLIC PANEL
Albumin: 3.7 g/dL (ref 3.5–5.2)
BUN: 17 mg/dL (ref 6–23)
CO2: 25 mEq/L (ref 19–32)
Calcium: 8.6 mg/dL (ref 8.4–10.5)
Chloride: 103 mEq/L (ref 96–112)
Glucose, Bld: 94 mg/dL (ref 70–99)
Potassium: 4.4 mEq/L (ref 3.5–5.3)
Total Protein: 6.1 g/dL (ref 6.0–8.3)

## 2011-10-14 LAB — CBC WITH DIFFERENTIAL/PLATELET
Basophils Absolute: 0.1 10*3/uL (ref 0.0–0.1)
Eosinophils Absolute: 0 10*3/uL (ref 0.0–0.5)
HGB: 10.2 g/dL — ABNORMAL LOW (ref 13.0–17.1)
MCV: 87.2 fL (ref 79.3–98.0)
MONO#: 0.3 10*3/uL (ref 0.1–0.9)
NEUT#: 1.5 10*3/uL (ref 1.5–6.5)
RDW: 19.4 % — ABNORMAL HIGH (ref 11.0–14.6)
WBC: 2.4 10*3/uL — ABNORMAL LOW (ref 4.0–10.3)
lymph#: 0.6 10*3/uL — ABNORMAL LOW (ref 0.9–3.3)

## 2011-10-14 MED ORDER — SODIUM CHLORIDE 0.9 % IV SOLN
1000.0000 mg/m2 | Freq: Once | INTRAVENOUS | Status: AC
  Administered 2011-10-14: 2242 mg via INTRAVENOUS
  Filled 2011-10-14: qty 59

## 2011-10-14 MED ORDER — PROCHLORPERAZINE MALEATE 10 MG PO TABS
10.0000 mg | ORAL_TABLET | Freq: Once | ORAL | Status: AC
  Administered 2011-10-14: 10 mg via ORAL

## 2011-10-25 ENCOUNTER — Emergency Department (HOSPITAL_COMMUNITY): Payer: Medicare Other

## 2011-10-25 ENCOUNTER — Inpatient Hospital Stay (HOSPITAL_COMMUNITY)
Admission: EM | Admit: 2011-10-25 | Discharge: 2011-10-27 | DRG: 690 | Disposition: A | Discharge status: Home / Self Care | Payer: Medicare Other | Attending: Internal Medicine | Admitting: Internal Medicine

## 2011-10-25 ENCOUNTER — Encounter (HOSPITAL_COMMUNITY): Payer: Self-pay

## 2011-10-25 DIAGNOSIS — Z8546 Personal history of malignant neoplasm of prostate: Secondary | ICD-10-CM

## 2011-10-25 DIAGNOSIS — C679 Malignant neoplasm of bladder, unspecified: Secondary | ICD-10-CM | POA: Diagnosis present

## 2011-10-25 DIAGNOSIS — E785 Hyperlipidemia, unspecified: Secondary | ICD-10-CM | POA: Diagnosis present

## 2011-10-25 DIAGNOSIS — N390 Urinary tract infection, site not specified: Principal | ICD-10-CM | POA: Diagnosis present

## 2011-10-25 DIAGNOSIS — I10 Essential (primary) hypertension: Secondary | ICD-10-CM | POA: Diagnosis present

## 2011-10-25 DIAGNOSIS — R509 Fever, unspecified: Secondary | ICD-10-CM

## 2011-10-25 DIAGNOSIS — D649 Anemia, unspecified: Secondary | ICD-10-CM

## 2011-10-25 DIAGNOSIS — C649 Malignant neoplasm of unspecified kidney, except renal pelvis: Secondary | ICD-10-CM

## 2011-10-25 DIAGNOSIS — I129 Hypertensive chronic kidney disease with stage 1 through stage 4 chronic kidney disease, or unspecified chronic kidney disease: Secondary | ICD-10-CM | POA: Diagnosis present

## 2011-10-25 DIAGNOSIS — C7800 Secondary malignant neoplasm of unspecified lung: Secondary | ICD-10-CM | POA: Diagnosis present

## 2011-10-25 DIAGNOSIS — D709 Neutropenia, unspecified: Secondary | ICD-10-CM | POA: Diagnosis present

## 2011-10-25 DIAGNOSIS — R5081 Fever presenting with conditions classified elsewhere: Secondary | ICD-10-CM | POA: Diagnosis present

## 2011-10-25 DIAGNOSIS — N189 Chronic kidney disease, unspecified: Secondary | ICD-10-CM

## 2011-10-25 DIAGNOSIS — T83511A Infection and inflammatory reaction due to indwelling urethral catheter, initial encounter: Secondary | ICD-10-CM

## 2011-10-25 DIAGNOSIS — T451X5A Adverse effect of antineoplastic and immunosuppressive drugs, initial encounter: Secondary | ICD-10-CM | POA: Diagnosis present

## 2011-10-25 DIAGNOSIS — C801 Malignant (primary) neoplasm, unspecified: Secondary | ICD-10-CM

## 2011-10-25 DIAGNOSIS — D6481 Anemia due to antineoplastic chemotherapy: Secondary | ICD-10-CM | POA: Diagnosis present

## 2011-10-25 DIAGNOSIS — D702 Other drug-induced agranulocytosis: Secondary | ICD-10-CM | POA: Diagnosis present

## 2011-10-25 DIAGNOSIS — D696 Thrombocytopenia, unspecified: Secondary | ICD-10-CM

## 2011-10-25 LAB — DIFFERENTIAL
Basophils Absolute: 0 10*3/uL (ref 0.0–0.1)
Eosinophils Absolute: 0 10*3/uL (ref 0.0–0.7)
Eosinophils Relative: 1 % (ref 0–5)

## 2011-10-25 LAB — URINALYSIS, ROUTINE W REFLEX MICROSCOPIC
Bilirubin Urine: NEGATIVE
Glucose, UA: NEGATIVE mg/dL
Protein, ur: 30 mg/dL — AB
Specific Gravity, Urine: 1.014 (ref 1.005–1.030)
pH: 6 (ref 5.0–8.0)

## 2011-10-25 LAB — URINE MICROSCOPIC-ADD ON

## 2011-10-25 LAB — COMPREHENSIVE METABOLIC PANEL
ALT: 19 U/L (ref 0–53)
AST: 26 U/L (ref 0–37)
Calcium: 9 mg/dL (ref 8.4–10.5)
Sodium: 140 mEq/L (ref 135–145)
Total Protein: 6.1 g/dL (ref 6.0–8.3)

## 2011-10-25 LAB — CBC
MCH: 29.9 pg (ref 26.0–34.0)
MCV: 89.2 fL (ref 78.0–100.0)
Platelets: 40 10*3/uL — ABNORMAL LOW (ref 150–400)
RDW: 20.3 % — ABNORMAL HIGH (ref 11.5–15.5)
WBC: 1.7 10*3/uL — ABNORMAL LOW (ref 4.0–10.5)

## 2011-10-25 LAB — PROCALCITONIN: Procalcitonin: 0.94 ng/mL

## 2011-10-25 MED ORDER — SODIUM CHLORIDE 0.9 % IV BOLUS (SEPSIS)
1000.0000 mL | Freq: Once | INTRAVENOUS | Status: AC
  Administered 2011-10-25: 1000 mL via INTRAVENOUS

## 2011-10-25 MED ORDER — FINASTERIDE 5 MG PO TABS
5.0000 mg | ORAL_TABLET | Freq: Every day | ORAL | Status: DC
  Administered 2011-10-26 – 2011-10-27 (×2): 5 mg via ORAL
  Filled 2011-10-25 (×2): qty 1

## 2011-10-25 MED ORDER — SODIUM CHLORIDE 0.9 % IV SOLN
500.0000 mg | INTRAVENOUS | Status: AC
  Administered 2011-10-25: 500 mg via INTRAVENOUS
  Filled 2011-10-25: qty 500

## 2011-10-25 MED ORDER — ACETAMINOPHEN 325 MG PO TABS
650.0000 mg | ORAL_TABLET | Freq: Once | ORAL | Status: AC
Start: 2011-10-25 — End: 2011-10-25
  Administered 2011-10-25: 650 mg via ORAL
  Filled 2011-10-25: qty 2

## 2011-10-25 MED ORDER — VANCOMYCIN HCL IN DEXTROSE 1-5 GM/200ML-% IV SOLN
1000.0000 mg | INTRAVENOUS | Status: AC
  Administered 2011-10-25: 1000 mg via INTRAVENOUS
  Filled 2011-10-25: qty 200

## 2011-10-25 MED ORDER — ACETAMINOPHEN 500 MG PO TABS
500.0000 mg | ORAL_TABLET | Freq: Four times a day (QID) | ORAL | Status: DC | PRN
  Administered 2011-10-26 (×2): 1000 mg via ORAL
  Filled 2011-10-25 (×2): qty 2

## 2011-10-25 MED ORDER — VANCOMYCIN HCL 1000 MG IV SOLR
2000.0000 mg | Freq: Once | INTRAVENOUS | Status: AC
  Administered 2011-10-25: 2000 mg via INTRAVENOUS
  Filled 2011-10-25: qty 2000

## 2011-10-25 MED ORDER — ONDANSETRON HCL 4 MG PO TABS: 4.0000 mg | ORAL_TABLET | Freq: Three times a day (TID) | ORAL | Status: DC | PRN

## 2011-10-25 MED ORDER — ATORVASTATIN CALCIUM 40 MG PO TABS
40.0000 mg | ORAL_TABLET | Freq: Every day | ORAL | Status: DC
  Administered 2011-10-25 – 2011-10-26 (×2): 40 mg via ORAL
  Filled 2011-10-25 (×3): qty 1

## 2011-10-25 MED ORDER — ACETAMINOPHEN 325 MG PO TABS
650.0000 mg | ORAL_TABLET | Freq: Once | ORAL | Status: AC
  Administered 2011-10-25: 650 mg via ORAL
  Filled 2011-10-25: qty 2

## 2011-10-25 MED ORDER — AMLODIPINE BESYLATE 5 MG PO TABS
5.0000 mg | ORAL_TABLET | Freq: Every day | ORAL | Status: DC
  Administered 2011-10-26 – 2011-10-27 (×2): 5 mg via ORAL
  Filled 2011-10-25 (×2): qty 1

## 2011-10-25 MED ORDER — SODIUM CHLORIDE 0.9 % IV SOLN
INTRAVENOUS | Status: DC
  Administered 2011-10-25: 75 mL/h via INTRAVENOUS
  Administered 2011-10-26 – 2011-10-27 (×3): via INTRAVENOUS

## 2011-10-25 MED ORDER — SODIUM CHLORIDE 0.9 % IV SOLN
INTRAVENOUS | Status: DC
  Administered 2011-10-25: 13:00:00 via INTRAVENOUS

## 2011-10-25 MED ORDER — SODIUM CHLORIDE 0.9 % IV SOLN
500.0000 mg | Freq: Three times a day (TID) | INTRAVENOUS | Status: DC
  Administered 2011-10-25 – 2011-10-27 (×5): 500 mg via INTRAVENOUS
  Filled 2011-10-25 (×8): qty 500

## 2011-10-25 NOTE — H&P (Signed)
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  Has had open left nephroureterectomy done on June 04, 2010.  Is presenting today to the ED complaining of fevers.  Patient has chronic indwelling catheter and after work-up of his urine was found to show cloudy urine, nitrite, leukocytes, and many bacteria.  Urine cultures were obtained in the ED as well as urine culture.  Chest x ray was negative for infiltrates and patient denies any cough or difficulty breathing.     WBC count was found to be 1.7 while in the ED and patient reports that he is currently supposed to get more chemotherapy Thursday.  Family is concerned and asking whether or not patient will still be able to get his chemotherapy.    Allergies:   Allergies  Allergen Reactions  . Morphine And Related Anxiety  . Penicillins Rash      Past Medical History  Diagnosis Date  . Hypertension   . Chronic kidney disease 06/15/11    bladder cancer  . Blood transfusion   . Arthritis   . Anemia 08/16/2011  . Cancer     bladder cancer, hx prostate cancer    Past Surgical History  Procedure Date  . Colon surgery   . Appendectomy   . Joint replacement   . Vascular surgery     aortic anurysm repair  . Nephrectomy     left  . Arcuate keratectomy   . Bladder surgery     Bx of bladder x4  . Prostate biopsy     Prostate seeds/radiation  . Cystoscopy 06/21/2011    Procedure: CYSTOSCOPY;  Surgeon: Crecencio Mc, MD;  Location: [facility];  Service: Urology;  Laterality: N/A;  . Transurethral resection of bladder tumor 06/21/2011    Procedure: TRANSURETHRAL RESECTION OF BLADDER TUMOR (TURBT);  Surgeon: Crecencio Mc, MD;   Location: [facility];  Service: Urology;  Laterality: N/A;  . Transurethral resection of prostate 06/21/2011    Procedure: TRANSURETHRAL RESECTION OF THE PROSTATE (TURP);  Surgeon: Crecencio Mc, MD;  Location: [facility];  Service: Urology;  Laterality: N/A;  . Cystoscopy with urethral dilatation 06/21/2011    Procedure: CYSTOSCOPY WITH URETHRAL DILATATION;  Surgeon: Crecencio Mc, MD;  Location: [facility];  Service: Urology;  Laterality: N/A;    Prior to Admission medications   Medication Sig Start Date End Date Taking? Authorizing Provider  acetaminophen (TYLENOL) 500 MG tablet Take 500-1,000 mg by mouth every 6 (six) hours as needed. pain   Yes Historical Provider, MD  amLODipine (NORVASC) 10 MG tablet Take 0.5 tablets (5 mg total) by mouth every morning. 08/20/11  Yes Laveda Norman, MD  finasteride (PROSCAR) 5 MG tablet Take 5 mg by mouth daily after lunch daily after lunch.     Yes Historical Provider, MD  ondansetron (ZOFRAN) 8 MG tablet Take 1 tab two times a day starting the day after chemo for 3 days. Then take 1 tab two times a day as needed for nausea or vomiting.  07/28/11 07/27/12 Yes Benjiman Core, MD  simvastatin (ZOCOR) 80 MG tablet Take 40 mg by mouth at bedtime.    Yes Historical Provider, MD    Social  History:  reports that he quit smoking about 28 years ago. His smoking use included Cigarettes. He has never used smokeless tobacco. He reports that he does not drink alcohol. His drug history not on file.  History reviewed. No pertinent family history.  Review of Systems:  Constitutional: Denies fever, chills, diaphoresis, appetite change and fatigue.  HEENT: Denies photophobia, eye pain, redness, hearing loss, ear pain, congestion, sore throat, rhinorrhea, sneezing, mouth sores, trouble swallowing, neck pain, neck stiffness and tinnitus.   Respiratory: Denies SOB, DOE, cough, chest tightness,  and wheezing.   Cardiovascular: Denies chest pain, palpitations and leg swelling.  Gastrointestinal:  Denies nausea, vomiting, abdominal pain, diarrhea, constipation, blood in stool and abdominal distention.  Genitourinary: Denies dysuria, urgency, frequency, hematuria, flank pain and difficulty urinating.  Musculoskeletal: Denies myalgias, back pain, joint swelling, arthralgias and gait problem.  Skin: Denies pallor, rash and wound.  Neurological: Denies dizziness, seizures, syncope, weakness, light-headedness, numbness and headaches.  Hematological: Denies adenopathy. Easy bruising, personal or family bleeding history  Psychiatric/Behavioral: Denies suicidal ideation, mood changes, confusion, nervousness, sleep disturbance and agitation   Physical Exam: Blood pressure 152/82, pulse 114, temperature 102.3 F (39.1 C), temperature source Oral, resp. rate 20, height 5' 9.75" (1.772 m), weight 101.5 kg (223 lb 12.3 oz), SpO2 99.00%. General: Alert, awake, oriented x3, in no acute distress, pt is non toxic appearing HEENT: No bruits, no goiter. Chest: Portacath in place there is no erythema or cellulitis surrounding cath. Heart: Regular rate and rhythm, without murmurs, rubs, gallops. Lungs: Clear to auscultation bilaterally. Abdomen: Soft, nontender, nondistended, positive bowel sounds. GU:  New foley in place. Extremities: No clubbing cyanosis or edema with positive pedal pulses. Neuro: Grossly intact, nonfocal.   Labs on Admission:  Results for orders placed during the hospital encounter of 10/25/11 (from the past 48 hour(s))  LACTIC ACID, PLASMA     Status: Abnormal   Collection Time   10/25/11 11:16 AM      Component Value Range Comment   Lactic Acid, Venous 2.4 (*) 0.5 - 2.2 (mmol/L)   URINALYSIS, ROUTINE W REFLEX MICROSCOPIC     Status: Abnormal   Collection Time   10/25/11 11:27 AM      Component Value Range Comment   Color, Urine YELLOW  YELLOW     APPearance CLOUDY (*) CLEAR     Specific Gravity, Urine 1.014  1.005 - 1.030     pH 6.0  5.0 - 8.0     Glucose, UA NEGATIVE   NEGATIVE (mg/dL)    Hgb urine dipstick MODERATE (*) NEGATIVE     Bilirubin Urine NEGATIVE  NEGATIVE     Ketones, ur NEGATIVE  NEGATIVE (mg/dL)    Protein, ur 30 (*) NEGATIVE (mg/dL)    Urobilinogen, UA 0.2  0.0 - 1.0 (mg/dL)    Nitrite POSITIVE (*) NEGATIVE     Leukocytes, UA MODERATE (*) NEGATIVE    URINE MICROSCOPIC-ADD ON     Status: Abnormal   Collection Time   10/25/11 11:27 AM      Component Value Range Comment   Squamous Epithelial / LPF RARE  RARE     WBC, UA 7-10  <3 (WBC/hpf)    RBC / HPF 3-6  <3 (RBC/hpf)    Bacteria, UA MANY (*) RARE     Urine-Other MUCOUS PRESENT     CBC     Status: Abnormal   Collection Time   10/25/11 11:45 AM      Component Value Range Comment  WBC 1.7 (*) 4.0 - 10.5 (K/uL)    RBC 2.68 (*) 4.22 - 5.81 (MIL/uL)    Hemoglobin 8.0 (*) 13.0 - 17.0 (g/dL)    HCT 09.8 (*) 11.9 - 52.0 (%)    MCV 89.2  78.0 - 100.0 (fL)    MCH 29.9  26.0 - 34.0 (pg)    MCHC 33.5  30.0 - 36.0 (g/dL)    RDW 14.7 (*) 82.9 - 15.5 (%)    Platelets 40 (*) 150 - 400 (K/uL)   DIFFERENTIAL     Status: Abnormal   Collection Time   10/25/11 11:45 AM      Component Value Range Comment   Neutrophils Relative 82 (*) 43 - 77 (%)    Neutro Abs 1.4 (*) 1.7 - 7.7 (K/uL)    Lymphocytes Relative 9 (*) 12 - 46 (%)    Lymphs Abs 0.2 (*) 0.7 - 4.0 (K/uL)    Monocytes Relative 8  3 - 12 (%)    Monocytes Absolute 0.1  0.1 - 1.0 (K/uL)    Eosinophils Relative 1  0 - 5 (%)    Eosinophils Absolute 0.0  0.0 - 0.7 (K/uL)    Basophils Relative 1  0 - 1 (%)    Basophils Absolute 0.0  0.0 - 0.1 (K/uL)   COMPREHENSIVE METABOLIC PANEL     Status: Abnormal   Collection Time   10/25/11 11:45 AM      Component Value Range Comment   Sodium 140  135 - 145 (mEq/L)    Potassium 3.8  3.5 - 5.1 (mEq/L)    Chloride 105  96 - 112 (mEq/L)    CO2 25  19 - 32 (mEq/L)    Glucose, Bld 92  70 - 99 (mg/dL)    BUN 12  6 - 23 (mg/dL)    Creatinine, Ser 5.62  0.50 - 1.35 (mg/dL)    Calcium 9.0  8.4 - 10.5  (mg/dL)    Total Protein 6.1  6.0 - 8.3 (g/dL)    Albumin 3.4 (*) 3.5 - 5.2 (g/dL)    AST 26  0 - 37 (U/L)    ALT 19  0 - 53 (U/L)    Alkaline Phosphatase 67  39 - 117 (U/L)    Total Bilirubin 0.5  0.3 - 1.2 (mg/dL)    GFR calc non Af Amer 62 (*) >90 (mL/min)    GFR calc Af Amer 72 (*) >90 (mL/min)   LIPASE, BLOOD     Status: Normal   Collection Time   10/25/11 11:45 AM      Component Value Range Comment   Lipase 18  11 - 59 (U/L)   PROCALCITONIN     Status: Normal   Collection Time   10/25/11 11:45 AM      Component Value Range Comment   Procalcitonin 0.94       Radiological Exams on Admission: Dg Chest 2 View  10/25/2011  *RADIOLOGY REPORT*  Clinical Data: Fever.  Immunosuppressed.  History of lung and bladder cancer.  CHEST - 2 VIEW  Comparison: 08/15/2011  Findings: A right-sided power port is in place, tip to the superior vena cava.  Heart is enlarged.  Aorta is tortuous.  There is minimal streaky density at the bases, most consistent with atelectasis.  No focal consolidations are identified.  No evidence for pulmonary edema.  There are degenerative changes in the spine.  Note is made of wedge compression fractures at T9 and L1.  IMPRESSION:  1.  Cardiomegaly. 2.  Bibasilar atelectasis with CAD. 3. Compression fractures. Per CMS PQRS reporting requirements (PQRS Measure 24): Given the patient's age of greater than 50 and the fracture site (hip, distal radius, or spine), the patient should be tested for osteoporosis using DXA, and the appropriate treatment considered based on the DXA results.l  Original Report Authenticated By: Patterson Hammersmith, M.D.    Assessment/Plan 1) Neutropenic Fevers:  At this juncture will admit patient for further observation.   - suspect secondary to chemotherapy and urinary source of infection given U/A findings - Continue IV antibiotics - follow fever curve - Have discussed with Heme/Onc and Dr. Clelia Croft will be notified of patient's admission. - Monitor  WBC counts - f/u blood culture and urine culture - no abdominal complaints currently to suggest neutropenic enterocolitis.  Therefore will defer ct of abdomen at this time. - Blood pressure has been stable and patient is non toxic appearing.  Will place on telemetry for closer observation.  2) UTI: Likely causing number one. - f/u urine culture - continue broad spectrum antibiotics - monitor vitals - agree with changing foley and has already been done.  3) CKD: Stable currently  4) Arthritis:  Currently no complaints.  Should patient have any will provide pain control.  5) Genitourinary transition cell cancer:  Per heme/onc.  They have been notified.  Disposition will plan on continuing regimen as indicated above and should patient's oncologist want to take over tomorrow will transfer over to their service.  For now will follow closely.  Time Spent on Admission: > 60 minutes reviewing chart, documenting, discussing with doctors, placing orders, documenting, updating information systems, billing.  Penny Pia [facility] Pager: [phone] 10/25/2011, 3:13 PM

## 2011-10-25 NOTE — Progress Notes (Addendum)
ANTIBIOTIC CONSULT NOTE - INITIAL  Pharmacy Consult for Vanc/Imipenem Indication: UTI (indwelling cath, Neutropenic)  Allergies  Allergen Reactions  . Morphine And Related Anxiety  . Penicillins Rash    Patient Measurements: Height: 5' 9.75" (177.2 cm) Weight: 223 lb 12.3 oz (101.5 kg) IBW/kg (Calculated) : 72.43    Vital Signs: Temp: 98.3 F (36.8 C) (03/11 1715) Temp src: Oral (03/11 1715) BP: 99/55 mmHg (03/11 1715) Pulse Rate: 74  (03/11 1715) Intake/Output from previous day:   Intake/Output from this shift: Total I/O In: 1440 [P.O.:240; I.V.:1200] Out: -   Labs:  Basename 10/25/11 1145  WBC 1.7*  HGB 8.0*  PLT 40*  LABCREA --  CREATININE 1.08   Estimated Creatinine Clearance: 62.7 ml/min (by C-G formula based on Cr of 1.08). Normalized CrCl  70ml/min/1.73m2  Microbiology: Recent Results (from the past 720 hour(s))  TECHNOLOGIST REVIEW     Status: Normal   Collection Time   10/07/11  8:02 AM      Component Value Range Status Comment   Technologist Review     Final    Value: Metas and Myelocytes present, few teardrops and spherocytes    Medical History: Past Medical History  Diagnosis Date  . Hypertension   . Chronic kidney disease 06/15/11    bladder cancer  . Blood transfusion   . Arthritis   . Anemia 08/16/2011  . Cancer     bladder cancer, hx prostate cancer    Medications:  Anti-infectives     Start     Dose/Rate Route Frequency Ordered Stop   10/25/11 1215   vancomycin (VANCOCIN) IVPB 1000 mg/200 mL premix        1,000 mg 200 mL/hr over 60 Minutes Intravenous To Emergency Dept 10/25/11 1115 10/25/11 1406   10/25/11 1145   imipenem-cilastatin (PRIMAXIN) 500 mg in sodium chloride 0.9 % 100 mL IVPB        500 mg 200 mL/hr over 30 Minutes Intravenous To Emergency Dept 10/25/11 1115 10/25/11 1255         Assessment: 84 YOM admitted w/ Neutropenic fever, UTI. Has chronic indwelling cat. Recently had chemotherapy for TCC. Pharmacy to  dose vancomycin and imipenem.  Had first doses in ER  Goal of Therapy:  Vancomycin trough level 15-20 mcg/ml Appropriate dose of Imipenem  Plan:   Vancomycin 2g IV x 1 dose load, then 1g IV q12h. 1g given in ER ~1300 so will time 2g load for 8pm tonight  Imipenem 500mg  IV q8h  Follow labs, vitals, cx  Vanc tr at steady state if necessary  Adjust doses as necessary  Gwen Her PharmD  [phone] 10/25/2011 6:17 PM

## 2011-10-25 NOTE — ED Notes (Signed)
Per EMS- Patient's family called EMS because the patient was shaking from a fever. Patient is currently receiving chemo

## 2011-10-25 NOTE — ED Notes (Signed)
[id number]<AV> Expected date:10/25/11<BR> Expected time:10:52 AM<BR> Means of arrival:Ambulance<BR> Comments:<BR> M41. 78 yo m. From home. Fever, shaking, sick. Recent CA diagnosis and chemo treatments. 10-12 mins.

## 2011-10-25 NOTE — ED Provider Notes (Signed)
History     CSN: [id number]  Arrival date & time 10/25/11  1055   First MD Initiated Contact with Patient 10/25/11 1058      Chief Complaint  Patient presents with  . Fever    (Consider location/radiation/quality/duration/timing/severity/associated sxs/prior treatment) HPI Comments: The patient presents from home after having acute onset of shaking chills this morning, found to have a fever by EMS personnel. He denies any localizing symptoms like dysuria, abdominal pain, nausea, vomiting, diarrhea, shortness of breath, cough, upper respiratory tract infection, headache, neck stiffness, or rash.  Patient is a 78 y.o. male presenting with fever. The history is provided by the patient, a relative and medical records.  Fever Primary symptoms of the febrile illness include fever and fatigue. Primary symptoms do not include headaches, cough, wheezing, shortness of breath, abdominal pain, nausea, vomiting, diarrhea, dysuria, altered mental status, myalgias, arthralgias or rash. The current episode started 3 to 5 days ago (Fatigue and generalized malaise for 3-5 days, fever just manifested this morning.). This is a new problem. The problem has been gradually improving.  The fever began today. The fever has been gradually improving since its onset. The maximum temperature recorded prior to his arrival was 102 to 102.9 F. The temperature was taken by an oral thermometer.  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 Risk factors for febrile illness include history of cancer and immunodeficiency (Current chemotherapy, indwelling Foley catheter).   Past Medical History  Diagnosis Date  . Hypertension   . Chronic kidney disease 06/15/11    bladder cancer  . Blood transfusion   . Arthritis   . Anemia 08/16/2011  . Cancer     bladder cancer, hx prostate cancer    Past Surgical History    Procedure Date  . Colon surgery   . Appendectomy   . Joint replacement   . Vascular surgery     aortic anurysm repair  . Nephrectomy     left  . Arcuate keratectomy   . Bladder surgery     Bx of bladder x4  . Prostate biopsy     Prostate seeds/radiation  . Cystoscopy 06/21/2011    Procedure: CYSTOSCOPY;  Surgeon: Crecencio Mc, MD;  Location: [facility];  Service: Urology;  Laterality: N/A;  . Transurethral resection of bladder tumor 06/21/2011    Procedure: TRANSURETHRAL RESECTION OF BLADDER TUMOR (TURBT);  Surgeon: Crecencio Mc, MD;  Location: [facility];  Service: Urology;  Laterality: N/A;  . Transurethral resection of prostate 06/21/2011    Procedure: TRANSURETHRAL RESECTION OF THE PROSTATE (TURP);  Surgeon: Crecencio Mc, MD;  Location: [facility];  Service: Urology;  Laterality: N/A;  . Cystoscopy with urethral dilatation 06/21/2011    Procedure: CYSTOSCOPY WITH URETHRAL DILATATION;  Surgeon: Crecencio Mc, MD;  Location: [facility];  Service: Urology;  Laterality: N/A;    History reviewed. No pertinent family history.  History  Substance Use Topics  . Smoking status: Former Smoker    Types: Cigarettes    Quit date: 07/16/1983  . Smokeless tobacco: Never Used  . Alcohol Use: No      Review of Systems  Constitutional: Positive for fever, chills and fatigue. Negative for diaphoresis.  HENT: Negative for ear pain, congestion, sore throat, rhinorrhea, neck pain and neck stiffness.   Respiratory: Negative for cough, shortness of breath and wheezing.   Cardiovascular: Negative for chest pain and leg swelling.  Gastrointestinal: Negative for nausea, vomiting, abdominal pain,  diarrhea and abdominal distention.  Genitourinary: Negative for dysuria.  Musculoskeletal: Negative for myalgias and arthralgias.  Skin: Negative for rash.  Neurological: Negative for headaches.  Hematological: Negative for adenopathy. Does not bruise/bleed easily.  Psychiatric/Behavioral: Negative.  Negative for altered mental  status.    Allergies  Morphine and related and Penicillins  Home Medications   Current Outpatient Rx  Name Route Sig Dispense Refill  . ACETAMINOPHEN 500 MG PO TABS Oral Take 500-1,000 mg by mouth every 6 (six) hours as needed. pain    . AMLODIPINE BESYLATE 10 MG PO TABS Oral Take 0.5 tablets (5 mg total) by mouth every morning. 30 tablet 0  . FINASTERIDE 5 MG PO TABS Oral Take 5 mg by mouth daily after lunch daily after lunch.      . ONDANSETRON HCL 8 MG PO TABS  Take 1 tab two times a day starting the day after chemo for 3 days. Then take 1 tab two times a day as needed for nausea or vomiting.  30 tablet 1    1 every 8 hours  As needed for nausea/vomiting  . SIMVASTATIN 80 MG PO TABS Oral Take 40 mg by mouth at bedtime.       BP 152/82  Pulse 114  Temp(Src) 102.3 F (39.1 C) (Oral)  Resp 20  Ht 5\' 11"  (1.803 m)  Wt 224 lb (101.606 kg)  BMI 31.24 kg/m2  SpO2 99%  Physical Exam  Nursing note and vitals reviewed. Constitutional: He is oriented to person, place, and time. He appears well-developed and well-nourished. No distress.  HENT:  Head: Normocephalic and atraumatic.  Right Ear: Hearing, tympanic membrane, external ear and ear canal normal.  Left Ear: Hearing, tympanic membrane, external ear and ear canal normal.  Nose: Nose normal. No mucosal edema or rhinorrhea. Right sinus exhibits no maxillary sinus tenderness and no frontal sinus tenderness. Left sinus exhibits no maxillary sinus tenderness and no frontal sinus tenderness.  Mouth/Throat: Uvula is midline, oropharynx is clear and moist and mucous membranes are normal. No uvula swelling. No oropharyngeal exudate, posterior oropharyngeal edema, posterior oropharyngeal erythema or tonsillar abscesses.  Eyes: Conjunctivae and EOM are normal.  Neck: Normal range of motion. No JVD present. No tracheal deviation present.  Cardiovascular: Normal rate, regular rhythm, normal heart sounds and intact distal pulses.  Exam reveals  no gallop and no friction rub.   No murmur heard. Pulmonary/Chest: Effort normal and breath sounds normal. No respiratory distress. He has no wheezes. He has no rales. He exhibits no tenderness.  Abdominal: Soft. Bowel sounds are normal. He exhibits no distension. There is no tenderness. There is no rebound and no guarding.  Musculoskeletal: Normal range of motion. He exhibits no edema and no tenderness.  Lymphadenopathy:    He has no cervical adenopathy.  Neurological: He is alert and oriented to person, place, and time. He has normal reflexes. No cranial nerve deficit. He exhibits normal muscle tone. Coordination normal.  Skin: Skin is warm and dry. No rash noted. He is not diaphoretic. No erythema. No pallor.  Psychiatric: He has a normal mood and affect. His behavior is normal.    ED Course  Procedures (including critical care time)   Labs Reviewed  CULTURE, BLOOD (ROUTINE X 2)  CULTURE, BLOOD (ROUTINE X 2)  URINALYSIS, ROUTINE W REFLEX MICROSCOPIC  URINE CULTURE  CBC  DIFFERENTIAL  COMPREHENSIVE METABOLIC PANEL  LIPASE, BLOOD  LACTIC ACID, PLASMA  PROCALCITONIN   No results found.   No diagnosis found.  MDM  The patient has apparent neutropenic fever, and is immunosuppressed currently receiving chemotherapy. He has no localizing symptoms, and blood cultures have been drawn. Urinary tract infection would be the most likely culprit for a cause of fever and infection do to the presence of an indwelling Foley catheter and history of urinary tract infection. Patient does not have signs or symptoms to suggest an intra-abdominal infectious process, and his respiratory status is normal with a clear lung exam making pneumonia less likely. His mental status is unaltered, and I do not get the impression of meningitis based on his symptoms and examination. I have ordered him. Antibiotic therapy with vancomycin and imipenem do to the patient's penicillin allergy. The patient will need  admission for further management and testing to determine the source of his fever.        Felisa Bonier, MD 10/25/11 [phone]

## 2011-10-26 DIAGNOSIS — D702 Other drug-induced agranulocytosis: Secondary | ICD-10-CM

## 2011-10-26 DIAGNOSIS — T451X5A Adverse effect of antineoplastic and immunosuppressive drugs, initial encounter: Secondary | ICD-10-CM

## 2011-10-26 DIAGNOSIS — D696 Thrombocytopenia, unspecified: Secondary | ICD-10-CM

## 2011-10-26 LAB — COMPREHENSIVE METABOLIC PANEL
ALT: 14 U/L (ref 0–53)
Alkaline Phosphatase: 49 U/L (ref 39–117)
BUN: 14 mg/dL (ref 6–23)
CO2: 27 mEq/L (ref 19–32)
Calcium: 8.3 mg/dL — ABNORMAL LOW (ref 8.4–10.5)
GFR calc Af Amer: 61 mL/min — ABNORMAL LOW (ref >90)
GFR calc non Af Amer: 52 mL/min — ABNORMAL LOW (ref >90)
Glucose, Bld: 92 mg/dL (ref 70–99)
Sodium: 140 mEq/L (ref 135–145)
Total Protein: 5.3 g/dL — ABNORMAL LOW (ref 6.0–8.3)

## 2011-10-26 LAB — CBC
HCT: 22.4 % — ABNORMAL LOW (ref 39.0–52.0)
Hemoglobin: 7.4 g/dL — ABNORMAL LOW (ref 13.0–17.0)
MCH: 30.1 pg (ref 26.0–34.0)
MCHC: 33 g/dL (ref 30.0–36.0)
MCV: 91.1 fL (ref 78.0–100.0)
RBC: 2.46 MIL/uL — ABNORMAL LOW (ref 4.22–5.81)

## 2011-10-26 MED ORDER — VANCOMYCIN HCL IN DEXTROSE 1-5 GM/200ML-% IV SOLN
1000.0000 mg | Freq: Two times a day (BID) | INTRAVENOUS | Status: DC
  Administered 2011-10-26 – 2011-10-27 (×2): 1000 mg via INTRAVENOUS
  Filled 2011-10-26 (×4): qty 200

## 2011-10-26 MED ORDER — VANCOMYCIN HCL IN DEXTROSE 1-5 GM/200ML-% IV SOLN
1000.0000 mg | INTRAVENOUS | Status: AC
  Administered 2011-10-26: 1000 mg via INTRAVENOUS
  Filled 2011-10-26: qty 200

## 2011-10-26 NOTE — Progress Notes (Signed)
UR completed 

## 2011-10-26 NOTE — Progress Notes (Signed)
Subjective: Pt feels improved.  His WBC count is higher today.  No acute issues overnight.  Denies any fever, chills, or diaphoresis.  No acute issues overnight reported.  Objective: Filed Vitals:   10/25/11 1900 10/25/11 2138 10/26/11 0557 10/26/11 1329  BP: 106/66 95/57 132/74 117/69  Pulse: 79 70 64 74  Temp: 98.1 F (36.7 C) 98.3 F (36.8 C) 98 F (36.7 C) 97.8 F (36.6 C)  TempSrc: Oral Oral Oral Oral  Resp: 18 16 18 18   Height: 5\' 9"  (1.753 m)     Weight: 100.88 kg (222 lb 6.4 oz)     SpO2: 97% 96% 97% 96%   Weight change:   Intake/Output Summary (Last 24 hours) at 10/26/11 1814 Last data filed at 10/26/11 1300  Gross per 24 hour  Intake 2807.5 ml  Output   1250 ml  Net 1557.5 ml    General: Alert, awake, oriented x3, in no acute distress.  HEENT: No bruits, no goiter.  Heart: Regular rate and rhythm, without murmurs, rubs, gallops.  Lungs: Clear to auscultation Abdomen: Soft, nontender, nondistended, positive bowel sounds.  Neuro: Grossly intact, nonfocal.   Lab Results:  Basename 10/26/11 0430 10/25/11 1145  NA 140 140  K 4.0 3.8  CL 106 105  CO2 27 25  GLUCOSE 92 92  BUN 14 12  CREATININE 1.24 1.08  CALCIUM 8.3* 9.0  MG -- --  PHOS -- --    Basename 10/26/11 0430 10/25/11 1145  AST 20 26  ALT 14 19  ALKPHOS 49 67  BILITOT 0.4 0.5  PROT 5.3* 6.1  ALBUMIN 2.7* 3.4*    Basename 10/25/11 1145  LIPASE 18  AMYLASE --    Basename 10/26/11 0430 10/25/11 1145  WBC 3.5* 1.7*  NEUTROABS -- 1.4*  HGB 7.4* 8.0*  HCT 22.4* 23.9*  MCV 91.1 89.2  PLT 51* 40*   No results found for this basename: CKTOTAL:3,CKMB:3,CKMBINDEX:3,TROPONINI:3 in the last 72 hours No components found with this basename: POCBNP:3 No results found for this basename: DDIMER:2 in the last 72 hours No results found for this basename: HGBA1C:2 in the last 72 hours No results found for this basename: CHOL:2,HDL:2,LDLCALC:2,TRIG:2,CHOLHDL:2,LDLDIRECT:2 in the last 72 hours No  results found for this basename: TSH,T4TOTAL,FREET3,T3FREE,THYROIDAB in the last 72 hours No results found for this basename: VITAMINB12:2,FOLATE:2,FERRITIN:2,TIBC:2,IRON:2,RETICCTPCT:2 in the last 72 hours  Micro Results: Recent Results (from the past 240 hour(s))  CULTURE, BLOOD (ROUTINE X 2)     Status: Normal (Preliminary result)   Collection Time   10/25/11 11:45 AM      Component Value Range Status Comment   Specimen Description BLOOD RIGHT CHEST PORT   Final    Special Requests BOTTLES DRAWN AEROBIC AND ANAEROBIC 4CC   Final    Culture  Setup Time [id number]   Final    Culture     Final    Value:        BLOOD CULTURE RECEIVED NO GROWTH TO DATE CULTURE WILL BE HELD FOR 5 DAYS BEFORE ISSUING A FINAL NEGATIVE REPORT   Report Status PENDING   Incomplete   CULTURE, BLOOD (ROUTINE X 2)     Status: Normal (Preliminary result)   Collection Time   10/25/11 12:15 PM      Component Value Range Status Comment   Specimen Description BLOOD RIGHT ARM   Final    Special Requests BOTTLES DRAWN AEROBIC AND ANAEROBIC [facility]   Final    Culture  Setup Time [id number]   Final  Culture     Final    Value:        BLOOD CULTURE RECEIVED NO GROWTH TO DATE CULTURE WILL BE HELD FOR 5 DAYS BEFORE ISSUING A FINAL NEGATIVE REPORT   Report Status PENDING   Incomplete     Studies/Results: Dg Chest 2 View  10/25/2011  *RADIOLOGY REPORT*  Clinical Data: Fever.  Immunosuppressed.  History of lung and bladder cancer.  CHEST - 2 VIEW  Comparison: 08/15/2011  Findings: A right-sided power port is in place, tip to the superior vena cava.  Heart is enlarged.  Aorta is tortuous.  There is minimal streaky density at the bases, most consistent with atelectasis.  No focal consolidations are identified.  No evidence for pulmonary edema.  There are degenerative changes in the spine.  Note is made of wedge compression fractures at T9 and L1.  IMPRESSION:  1.  Cardiomegaly. 2.  Bibasilar atelectasis with CAD. 3. Compression  fractures. Per CMS PQRS reporting requirements (PQRS Measure 24): Given the patient's age of greater than 50 and the fracture site (hip, distal radius, or spine), the patient should be tested for osteoporosis using DXA, and the appropriate treatment considered based on the DXA results.l  Original Report Authenticated By: Patterson Hammersmith, M.D.    Medications: I have reviewed the patient's current medications.   Patient Active Hospital Problem List: Neutropenic fever (10/25/2011) 1)  Have made note of oncology's recommendations.  If patient is afebrile overnight will discharge on oral antibiotics as recommended.  Source likely due to Urinary tract given history of indwelling catheter (was changed in the ED).   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  Patient will have to f/u with Dr. Clelia Croft as outpatient for recommendations regarding his chemotherapy.  HTN (hypertension) (08/16/2011) Well controlled currently.  Will continue current regimen.  Hyperlipemia (08/16/2011) Stable  UTI (lower urinary tract infection) (08/20/2011) Continue current IV antibiotic regimen and plan as per oncology and above.     LOS: 1 day   Penny Pia M.D.  [facility] 10/26/2011, 6:14 PM

## 2011-10-26 NOTE — Progress Notes (Signed)
IP PROGRESS NOTE  Subjective:   Patient doing better. Low grade temp yesterday.   Objective:  Vital signs in last 24 hours: Temp:  [98 F (36.7 C)-98.3 F (36.8 C)] 98 F (36.7 C) (03/12 0557) Pulse Rate:  [64-79] 64  (03/12 0557) Resp:  [16-18] 18  (03/12 0557) BP: (95-132)/(55-74) 132/74 mmHg (03/12 0557) SpO2:  [96 %-97 %] 97 % (03/12 0557) Weight:  [222 lb 6.4 oz (100.88 kg)] 222 lb 6.4 oz (100.88 kg) (03/11 1900) Weight change:  Last BM Date: 10/25/11  Intake/Output from previous day: 03/11 0701 - 03/12 0700 In: 3767.5 [P.O.:960; I.V.:2107.5; IV Piggyback:700] Out: 650 [Urine:650]  Mouth: mucous membranes moist, pharynx normal without lesions Resp: clear to auscultation bilaterally Cardio: regular rate and rhythm, S1, S2 normal, no murmur, click, rub or gallop GI: soft, non-tender; bowel sounds normal; no masses,  no organomegaly Extremities: extremities normal, atraumatic, no cyanosis or edema  Portacath/PICC-without erythema  Lab Results:  Basename 10/26/11 0430 10/25/11 1145  WBC 3.5* 1.7*  HGB 7.4* 8.0*  HCT 22.4* 23.9*  PLT 51* 40*    BMET  Basename 10/26/11 0430 10/25/11 1145  NA 140 140  K 4.0 3.8  CL 106 105  CO2 27 25  GLUCOSE 92 92  BUN 14 12  CREATININE 1.24 1.08  CALCIUM 8.3* 9.0    Studies/Results: Dg Chest 2 View  10/25/2011  *RADIOLOGY REPORT*  Clinical Data: Fever.  Immunosuppressed.  History of lung and bladder cancer.  CHEST - 2 VIEW  Comparison: 08/15/2011  Findings: A right-sided power port is in place, tip to the superior vena cava.  Heart is enlarged.  Aorta is tortuous.  There is minimal streaky density at the bases, most consistent with atelectasis.  No focal consolidations are identified.  No evidence for pulmonary edema.  There are degenerative changes in the spine.  Note is made of wedge compression fractures at T9 and L1.  IMPRESSION:  1.  Cardiomegaly. 2.  Bibasilar atelectasis with CAD. 3. Compression fractures. Per CMS PQRS  reporting requirements (PQRS Measure 24): Given the patient's age of greater than 50 and the fracture site (hip, distal radius, or spine), the patient should be tested for osteoporosis using DXA, and the appropriate treatment considered based on the DXA results.l  Original Report Authenticated By: Patterson Hammersmith, M.D.    Medications: I have reviewed the patient's current medications.  Assessment/Plan:  1) Neutropenic Fevers: Due to chemotherapy. Counts are stable improving. If afebrile overnight, OK to D/C on 3/13 on po abx. 2) Advanced GU cancer: follow up as outpatient on 3/14. 3) Anemia: due to chemotherapy. Improving.   4) Thrombocytopenia: improving. No bleeding.     LOS: 1 day   [facility] 10/26/2011, 12:49 PM

## 2011-10-27 LAB — CBC
Hemoglobin: 7.8 g/dL — ABNORMAL LOW (ref 13.0–17.0)
MCHC: 32.6 g/dL (ref 30.0–36.0)
RDW: 20.9 % — ABNORMAL HIGH (ref 11.5–15.5)
WBC: 2.8 10*3/uL — ABNORMAL LOW (ref 4.0–10.5)

## 2011-10-27 MED ORDER — HEPARIN (PORCINE) LOCK FLUSH 10 UNIT/ML IV SOLN
10.0000 [IU] | Freq: Once | INTRAVENOUS | Status: DC
  Filled 2011-10-27: qty 1

## 2011-10-27 MED ORDER — LEVOFLOXACIN 500 MG PO TABS: 500.0000 mg | ORAL_TABLET | Freq: Every day | ORAL | Status: AC

## 2011-10-27 MED ORDER — SACCHAROMYCES BOULARDII 250 MG PO CAPS: 250.0000 mg | ORAL_CAPSULE | Freq: Two times a day (BID) | ORAL | Status: AC

## 2011-10-27 MED ORDER — HEPARIN SOD (PORK) LOCK FLUSH 100 UNIT/ML IV SOLN
500.0000 [IU] | INTRAVENOUS | Status: AC | PRN
  Administered 2011-10-27: 500 [IU]

## 2011-10-27 NOTE — Discharge Instructions (Signed)
Follow with Primary MD Ginette Otto, MD, MD in 3 days   Get CBC, CMP, checked 3 days by Primary MD and again as instructed by your Primary MD. Get a 2 view Chest X ray done next visit, get evaluated for DEXA scan as you have some compression fractures in your back.  Get Medicines reviewed and adjusted.  Please request your Prim.MD to go over all Hospital Tests and Procedure/Radiological results at the follow up, please get all Hospital records sent to your Prim MD by signing hospital release before you go home.  Activity: Fall precautions use walker/cane & assistance as needed  Diet:  Cardiac, Aspiration precautions.  For Heart failure patients - Check your Weight same time everyday, if you gain over 2 pounds, or you develop in leg swelling, experience more shortness of breath or chest pain, call your Primary MD immediately. Follow Cardiac Low Salt Diet and 1.8 lit/day fluid restriction.  Disposition Home  If you experience worsening of your admission symptoms, develop shortness of breath, life threatening emergency, suicidal or homicidal thoughts you must seek medical attention immediately by calling 911 or calling your MD immediately  if symptoms less severe.  You Must read complete instructions/literature along with all the possible adverse reactions/side effects for all the Medicines you take and that have been prescribed to you. Take any new Medicines after you have completely understood and accpet all the possible adverse reactions/side effects.   Do not drive if your were admitted for syncope or siezures until you have seen by Primary MD or a Neurologist and advised to drive.  Do not drive when taking Pain medications.    Do not take more than prescribed Pain, Sleep and Anxiety Medications  Special Instructions: If you have smoked or chewed Tobacco  in the last 2 yrs please stop smoking, stop any regular Alcohol  and or any Recreational drug use.  Wear Seat belts while  driving.

## 2011-10-27 NOTE — Progress Notes (Signed)
10/27/11  The port-a cath was de-accessed. Patient was given discharge instructions. The patient is awaiting discharge from the hospital; via the private car belonging to the spouse.

## 2011-10-27 NOTE — Progress Notes (Signed)
Porta cath deaccessed. Flushed with 10cc NS followed by Heparin 5ml (100u/ml). No bleeding to site, band aid to site for comfort. [name] 

## 2011-10-27 NOTE — Discharge Summary (Signed)
[name], 78 y.o., DOB Sep 05, 1926, MRN [id number]. Admission date: 10/25/2011 Discharge Date 10/27/2011 Primary MD Ginette Otto, MD, MD Admitting Physician Penny Pia, MD  Admission Diagnosis  Neutropenia [288.0] Cancer [199.1] UTI (lower urinary tract infection) [599.0] Fever [780.60] Fever, shaking, sick  Discharge Diagnosis   Principal Problem:  *Neutropenic fever Active Problems:  Lung metastases  HTN (hypertension)  Hyperlipemia  UTI (lower urinary tract infection)    Past Medical History  Diagnosis Date  . Hypertension   . Chronic kidney disease 06/15/11    bladder cancer  . Blood transfusion   . Arthritis   . Anemia 08/16/2011  . Cancer     bladder cancer, hx prostate cancer    Past Surgical History  Procedure Date  . Colon surgery   . Appendectomy   . Joint replacement   . Vascular surgery     aortic anurysm repair  . Nephrectomy     left  . Arcuate keratectomy   . Bladder surgery     Bx of bladder x4  . Prostate biopsy     Prostate seeds/radiation  . Cystoscopy 06/21/2011    Procedure: CYSTOSCOPY;  Surgeon: Crecencio Mc, MD;  Location: [facility];  Service: Urology;  Laterality: N/A;  . Transurethral resection of bladder tumor 06/21/2011    Procedure: TRANSURETHRAL RESECTION OF BLADDER TUMOR (TURBT);  Surgeon: Crecencio Mc, MD;  Location: [facility];  Service: Urology;  Laterality: N/A;  . Transurethral resection of prostate 06/21/2011    Procedure: TRANSURETHRAL RESECTION OF THE PROSTATE (TURP);  Surgeon: Crecencio Mc, MD;  Location: [facility];  Service: Urology;  Laterality: N/A;  . Cystoscopy with urethral dilatation 06/21/2011    Procedure: CYSTOSCOPY WITH URETHRAL DILATATION;  Surgeon: Crecencio Mc, MD;  Location: [facility];  Service: Urology;  Laterality: N/A;     Hospital Course See H&P, Labs, Consult and Test reports for all details in brief, patient was admitted for    Neutropenic fever (10/25/2011) Have made note of oncology's recommendations. As  patient is afebrile overnight will discharge on oral antibiotics as recommended (levaquin). Source likely due to Urinary tract given history of indwelling catheter (was changed in the ED).  PCP Please follow final Urine and Blood culture results which are pending in 3 days.    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 Patient will have to f/u with Dr. Clelia Croft as outpatient for recommendations regarding his chemotherapy. Will see him in am tomorrow.   HTN (hypertension) (08/16/2011) Well controlled currently. Will continue current regimen.    Hyperlipemia (08/16/2011) Stable    Consults [facility]  Significant Tests:  See full reports for all details     Dg Chest 2 View  10/25/2011  *RADIOLOGY REPORT*  Clinical Data: Fever.  Immunosuppressed.  History of lung and bladder cancer.  CHEST - 2 VIEW  Comparison: 08/15/2011  Findings: A right-sided power port is in place, tip to the superior vena cava.  Heart is enlarged.  Aorta is tortuous.  There is minimal streaky density at the bases, most consistent with atelectasis.  No focal consolidations are identified.  No evidence for pulmonary edema.  There are degenerative changes in the spine.  Note is made of wedge compression fractures at T9 and L1.  IMPRESSION:  1.  Cardiomegaly. 2.  Bibasilar atelectasis with CAD. 3. Compression fractures. Per CMS PQRS reporting requirements (PQRS Measure 24): Given the patient's age of greater than 50 and the fracture site (hip, distal radius,  or spine), the patient should be tested for osteoporosis using DXA, and the appropriate treatment considered based on the DXA results.l  Original Report Authenticated By: Patterson Hammersmith, M.D.   Ct Chest W Contrast  09/30/2011  *RADIOLOGY REPORT*  Clinical Data:  Renal cell carcinoma  CT CHEST, ABDOMEN AND PELVIS WITH CONTRAST  Technique:  Multidetector CT imaging of the chest, abdomen and pelvis was  performed following the standard protocol during bolus administration of intravenous contrast.  Contrast: OMNIPAQUE IOHEXOL 300 MG/ML IV SOLN  Comparison:  06/30/2011  CT CHEST  Findings:  There is no enlarged axillary or supraclavicular lymph nodes.  No enlarged axillary lymph nodes.  There is no mediastinal or hilar lymph nodes identified.  No pericardial or pleural effusion identified.  Advanced calcified atherosclerotic disease involves the LAD and left circumflex coronary arteries.  Pulmonary nodule within the basilar segment of the left upper lobe measures 0.7 cm. This whether this is unchanged in size from previous exam this does appear less solid.  Nodule within superior segment of the left lower lobe measures 0.5 cm, image 37.  Previously this measured 1.1 cm.  No new or enlarging nodules or masses identified.  There is multilevel spondylosis identified within the thoracic spine. Sclerotic lesion within the proximal right humerus has a cartilaginous matrix and likely reflects a benign enchondroma. There is no airspace consolidation identified.  IMPRESSION:  1.  Interval improvement in left lung nodules. 2.  There is atherosclerosis of the thoracic aorta, the great vessels of the mediastinum and the coronary arteries, including calcified atherosclerotic plaque in the left circumflex and LAD coronary arteries.  Atherosclerosis, including left circumflex and LAD coronary artery disease. Please note that although the presence of coronary artery calcium documents the presence of coronary artery disease, the severity of this disease and any potential stenosis cannot be assessed on this non-gated CT examination.  Assessment for potential risk factor modification, dietary therapy or pharmacologic therapy may be warranted, if clinically indicated.  CT ABDOMEN AND PELVIS  Findings:  There are no focal liver abnormalities.  The spleen appears within normal limits.  Both adrenal glands are normal.  Gallbladder  contains multiple stones.  No cholecystitis.  No biliary ductal dilatation.  The pancreas appears normal.  Normal pancreatic duct.  Both adrenal glands are negative.  The spleen appears normal.  Tiny hypodensity within the inferior pole of the right kidney is stable measuring 0.7 cm, image 81.  Prior left nephrectomy.  No upper abdominal adenopathy.  At the level of the aortic bifurcation there is a small lymph node measuring 8.5 mm, image 94.  Previously 1.3 cm.  Left common iliac lymph node measures 5.7 mm, image 98.  Previously 7.8 mm.  The stomach and the small bowel loops have a normal course and caliber.  Extensive diverticular change involves the sigmoid colon.  Fat stranding and fluid is identified within the left lower quadrant of the abdomen which suggests either early or resolving diverticulitis.  No evidence for bowel perforation or abscess formation.  Seed implants noted within the prostate gland.  The urinary bladder is collapsed around a Foley catheter.  The left femoral head avascular necrosis noted.  Prior right total hip arthroplasty.  IMPRESSION:  1.  Lower abdominal lymph nodes are decreased from previous exam. 2.  No new or progressive disease identified. 3.  Left lower quadrant inflammatory changes are suspicious for either early or resolving acute diverticulitis.  Original Report Authenticated By: Rosealee Albee, M.D.  Ct Abdomen Pelvis W Contrast  09/30/2011  *RADIOLOGY REPORT*  Clinical Data:  Renal cell carcinoma  CT CHEST, ABDOMEN AND PELVIS WITH CONTRAST  Technique:  Multidetector CT imaging of the chest, abdomen and pelvis was performed following the standard protocol during bolus administration of intravenous contrast.  Contrast: OMNIPAQUE IOHEXOL 300 MG/ML IV SOLN  Comparison:  06/30/2011  CT CHEST  Findings:  There is no enlarged axillary or supraclavicular lymph nodes.  No enlarged axillary lymph nodes.  There is no mediastinal or hilar lymph nodes identified.  No  pericardial or pleural effusion identified.  Advanced calcified atherosclerotic disease involves the LAD and left circumflex coronary arteries.  Pulmonary nodule within the basilar segment of the left upper lobe measures 0.7 cm. This whether this is unchanged in size from previous exam this does appear less solid.  Nodule within superior segment of the left lower lobe measures 0.5 cm, image 37.  Previously this measured 1.1 cm.  No new or enlarging nodules or masses identified.  There is multilevel spondylosis identified within the thoracic spine. Sclerotic lesion within the proximal right humerus has a cartilaginous matrix and likely reflects a benign enchondroma. There is no airspace consolidation identified.  IMPRESSION:  1.  Interval improvement in left lung nodules. 2.  There is atherosclerosis of the thoracic aorta, the great vessels of the mediastinum and the coronary arteries, including calcified atherosclerotic plaque in the left circumflex and LAD coronary arteries.  Atherosclerosis, including left circumflex and LAD coronary artery disease. Please note that although the presence of coronary artery calcium documents the presence of coronary artery disease, the severity of this disease and any potential stenosis cannot be assessed on this non-gated CT examination.  Assessment for potential risk factor modification, dietary therapy or pharmacologic therapy may be warranted, if clinically indicated.  CT ABDOMEN AND PELVIS  Findings:  There are no focal liver abnormalities.  The spleen appears within normal limits.  Both adrenal glands are normal.  Gallbladder contains multiple stones.  No cholecystitis.  No biliary ductal dilatation.  The pancreas appears normal.  Normal pancreatic duct.  Both adrenal glands are negative.  The spleen appears normal.  Tiny hypodensity within the inferior pole of the right kidney is stable measuring 0.7 cm, image 81.  Prior left nephrectomy.  No upper abdominal adenopathy.  At  the level of the aortic bifurcation there is a small lymph node measuring 8.5 mm, image 94.  Previously 1.3 cm.  Left common iliac lymph node measures 5.7 mm, image 98.  Previously 7.8 mm.  The stomach and the small bowel loops have a normal course and caliber.  Extensive diverticular change involves the sigmoid colon.  Fat stranding and fluid is identified within the left lower quadrant of the abdomen which suggests either early or resolving diverticulitis.  No evidence for bowel perforation or abscess formation.  Seed implants noted within the prostate gland.  The urinary bladder is collapsed around a Foley catheter.  The left femoral head avascular necrosis noted.  Prior right total hip arthroplasty.  IMPRESSION:  1.  Lower abdominal lymph nodes are decreased from previous exam. 2.  No new or progressive disease identified. 3.  Left lower quadrant inflammatory changes are suspicious for either early or resolving acute diverticulitis.  Original Report Authenticated By: Rosealee Albee, M.D.     Today   Subjective:   [name] today has no headache,no chest abdominal pain,no new weakness tingling or numbness, feels much better wants to go home  today.   Objective:   Blood pressure 138/72, pulse 67, temperature 97.9 F (36.6 C), temperature source Oral, resp. rate 18, height 5\' 9"  (1.753 m), weight 100.88 kg (222 lb 6.4 oz), SpO2 98.00%.  Intake/Output Summary (Last 24 hours) at 10/27/11 1137 Last data filed at 10/27/11 0829  Gross per 24 hour  Intake   2780 ml  Output   4025 ml  Net  -1245 ml    Exam Awake Alert, Oriented *3, No new F.N deficits, Normal affect [place].AT,PERRAL Supple Neck,No JVD, No cervical lymphadenopathy appriciated.  Symmetrical Chest wall movement, Good air movement bilaterally, CTAB RRR,No Gallops,Rubs or new Murmurs, No Parasternal Heave +ve B.Sounds, Abd Soft, Non tender, No organomegaly appriciated, No rebound -guarding or rigidity. No Cyanosis, Clubbing or  edema, No new Rash or bruise, chronic foley  Data Review      CBC w Diff: Lab Results  Component Value Date   WBC 2.8* 10/27/2011   WBC 2.4* 10/14/2011   HGB 7.8* 10/27/2011   HGB 10.2* 10/14/2011   HCT 23.9* 10/27/2011   HCT 30.7* 10/14/2011   PLT 93* 10/27/2011   PLT 180 10/14/2011   LYMPHOPCT 9* 10/25/2011   LYMPHOPCT 25.8 10/14/2011   BANDSPCT 0 08/20/2011   MONOPCT 8 10/25/2011   MONOPCT 12.3 10/14/2011   EOSPCT 1 10/25/2011   EOSPCT 0.0 10/14/2011   BASOPCT 1 10/25/2011   BASOPCT 2.5* 10/14/2011   CMP: Lab Results  Component Value Date   NA 140 10/26/2011   K 4.0 10/26/2011   CL 106 10/26/2011   CO2 27 10/26/2011   BUN 14 10/26/2011   CREATININE 1.24 10/26/2011   PROT 5.3* 10/26/2011   ALBUMIN 2.7* 10/26/2011   BILITOT 0.4 10/26/2011   ALKPHOS 49 10/26/2011   AST 20 10/26/2011   ALT 14 10/26/2011  .  Micro Results Recent Results (from the past 240 hour(s))  URINE CULTURE     Status: Normal (Preliminary result)   Collection Time   10/25/11 11:27 AM      Component Value Range Status Comment   Specimen Description URINE, CATHETERIZED   Final    Special Requests NONE   Final    Culture  Setup Time [id number]   Final    Colony Count PENDING   Incomplete    Culture Culture reincubated for better growth   Final    Report Status PENDING   Incomplete   CULTURE, BLOOD (ROUTINE X 2)     Status: Normal (Preliminary result)   Collection Time   10/25/11 11:45 AM      Component Value Range Status Comment   Specimen Description BLOOD RIGHT CHEST PORT   Final    Special Requests BOTTLES DRAWN AEROBIC AND ANAEROBIC 4CC   Final    Culture  Setup Time [id number]   Final    Culture     Final    Value:        BLOOD CULTURE RECEIVED NO GROWTH TO DATE CULTURE WILL BE HELD FOR 5 DAYS BEFORE ISSUING A FINAL NEGATIVE REPORT   Report Status PENDING   Incomplete   CULTURE, BLOOD (ROUTINE X 2)     Status: Normal (Preliminary result)   Collection Time   10/25/11 12:15 PM      Component Value Range  Status Comment   Specimen Description BLOOD RIGHT ARM   Final    Special Requests BOTTLES DRAWN AEROBIC AND ANAEROBIC [facility]   Final    Culture  Setup Time [id number]   Final  Culture     Final    Value:        BLOOD CULTURE RECEIVED NO GROWTH TO DATE CULTURE WILL BE HELD FOR 5 DAYS BEFORE ISSUING A FINAL NEGATIVE REPORT   Report Status PENDING   Incomplete      Discharge Instructions     Follow with Primary MD Ginette Otto, MD, MD in 3 days   Get CBC, CMP, checked 3 days by Primary MD and again as instructed by your Primary MD. Get a 2 view Chest X ray done next visit, get evaluated for DEXA scan as you have some compression fractures in your back.  Get Medicines reviewed and adjusted.  Please request your Prim.MD to go over all Hospital Tests and Procedure/Radiological results at the follow up, please get all Hospital records sent to your Prim MD by signing hospital release before you go home.  Activity: Fall precautions use walker/cane & assistance as needed  Diet:  Cardiac, Aspiration precautions.  For Heart failure patients - Check your Weight same time everyday, if you gain over 2 pounds, or you develop in leg swelling, experience more shortness of breath or chest pain, call your Primary MD immediately. Follow Cardiac Low Salt Diet and 1.8 lit/day fluid restriction.  Disposition Home  If you experience worsening of your admission symptoms, develop shortness of breath, life threatening emergency, suicidal or homicidal thoughts you must seek medical attention immediately by calling 911 or calling your MD immediately  if symptoms less severe.  You Must read complete instructions/literature along with all the possible adverse reactions/side effects for all the Medicines you take and that have been prescribed to you. Take any new Medicines after you have completely understood and accpet all the possible adverse reactions/side effects.   Do not drive if your were admitted for  syncope or siezures until you have seen by Primary MD or a Neurologist and advised to drive.  Do not drive when taking Pain medications.    Do not take more than prescribed Pain, Sleep and Anxiety Medications  Special Instructions: If you have smoked or chewed Tobacco  in the last 2 yrs please stop smoking, stop any regular Alcohol  and or any Recreational drug use.  Wear Seat belts while driving.  Follow-up Information    Follow up with Ginette Otto, MD. Schedule an appointment as soon as possible for a visit in 3 days.      Follow up with [facility], MD. Schedule an appointment as soon as possible for a visit in 1 day.   Contact information:   [address] [place] [address] [phone]          Discharge Medications   Medication List  As of 10/27/2011 11:37 AM   START taking these medications         levofloxacin 500 MG tablet   Commonly known as: LEVAQUIN   Take 1 tablet (500 mg total) by mouth daily.      saccharomyces boulardii 250 MG capsule   Commonly known as: FLORASTOR   Take 1 capsule (250 mg total) by mouth 2 (two) times daily.         CONTINUE taking these medications         acetaminophen 500 MG tablet   Commonly known as: TYLENOL      amLODipine 10 MG tablet   Commonly known as: NORVASC   Take 0.5 tablets (5 mg total) by mouth every morning.      finasteride 5  MG tablet   Commonly known as: PROSCAR      ondansetron 8 MG tablet   Commonly known as: ZOFRAN   Take 1 tab two times a day starting the day after chemo for 3 days. Then take 1 tab two times a day as needed for nausea or vomiting.        simvastatin 80 MG tablet   Commonly known as: ZOCOR          Where to get your medications    These are the prescriptions that you need to pick up.   You may get these medications from any pharmacy.         levofloxacin 500 MG tablet   saccharomyces boulardii 250 MG capsule             Total Time in preparing  paper work, data evaluation and todays exam - 35 minutes  Leroy Sea M.D on 10/27/2011 at 11:37 AM  [facility] Office  [phone]

## 2011-10-28 ENCOUNTER — Other Ambulatory Visit (HOSPITAL_BASED_OUTPATIENT_CLINIC_OR_DEPARTMENT_OTHER): Payer: Medicare Other | Admitting: Lab

## 2011-10-28 ENCOUNTER — Ambulatory Visit (HOSPITAL_BASED_OUTPATIENT_CLINIC_OR_DEPARTMENT_OTHER): Payer: Medicare Other | Admitting: Oncology

## 2011-10-28 ENCOUNTER — Telehealth: Payer: Self-pay | Admitting: Oncology

## 2011-10-28 VITALS — BP 150/83 | HR 79 | Temp 97.1°F | Ht 69.0 in | Wt 231.5 lb

## 2011-10-28 DIAGNOSIS — D6481 Anemia due to antineoplastic chemotherapy: Secondary | ICD-10-CM

## 2011-10-28 DIAGNOSIS — Z8546 Personal history of malignant neoplasm of prostate: Secondary | ICD-10-CM

## 2011-10-28 DIAGNOSIS — T451X5A Adverse effect of antineoplastic and immunosuppressive drugs, initial encounter: Secondary | ICD-10-CM

## 2011-10-28 DIAGNOSIS — C7800 Secondary malignant neoplasm of unspecified lung: Secondary | ICD-10-CM

## 2011-10-28 DIAGNOSIS — C659 Malignant neoplasm of unspecified renal pelvis: Secondary | ICD-10-CM

## 2011-10-28 DIAGNOSIS — C649 Malignant neoplasm of unspecified kidney, except renal pelvis: Secondary | ICD-10-CM

## 2011-10-28 LAB — URINE CULTURE
Colony Count: >=100000
Culture  Setup Time: 201303111532

## 2011-10-28 LAB — COMPREHENSIVE METABOLIC PANEL
Albumin: 3.5 g/dL (ref 3.5–5.2)
Alkaline Phosphatase: 62 U/L (ref 39–117)
BUN: 13 mg/dL (ref 6–23)
Glucose, Bld: 121 mg/dL — ABNORMAL HIGH (ref 70–99)
Total Bilirubin: 0.5 mg/dL (ref 0.3–1.2)

## 2011-10-28 LAB — CBC WITH DIFFERENTIAL/PLATELET
Basophils Absolute: 0 10*3/uL (ref 0.0–0.1)
EOS%: 2.2 % (ref 0.0–7.0)
HCT: 26.2 % — ABNORMAL LOW (ref 38.4–49.9)
HGB: 8.5 g/dL — ABNORMAL LOW (ref 13.0–17.1)
MCH: 29.1 pg (ref 27.2–33.4)
MCV: 89.7 fL (ref 79.3–98.0)
MONO%: 24.1 % — ABNORMAL HIGH (ref 0.0–14.0)
NEUT%: 51.1 % (ref 39.0–75.0)
Platelets: 136 10*3/uL — ABNORMAL LOW (ref 140–400)

## 2011-10-28 NOTE — Telephone Encounter (Signed)
gv pt appt for march -april2013 

## 2011-10-28 NOTE — Progress Notes (Signed)
Hematology and Oncology Follow Up Visit  [name] [id number] [birth year] 78 y.o. 10/28/2011 9:23 AM   CC: [name], MD  [name], MD   Principle Diagnosis: This is a pleasant 78 year old gentleman with the following issues:  1. 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 Now has metastatic disease with lung nodules. 2. Prostate cancer: He is s/p treatment with brachytherapy in December 2002. He has had no evidence for cancer recurrence since treatment.    Prior Therapy: He underwent an open left nephroureterectomy done on June 04, 2010. His pathology from that showed [phone], showed that he had a papillary invasive high-grade urothelial carcinoma with negative urinary bladder cuff margins. The tumor was 3.8 cm. No lymphovascular invasion. Again, was a T2 NX disease.   Current therapy: Patient to start day 1, cycle 5 of chemotherapy of Carboplatin and Gemzar.  Interim History: [name] presents for a follow up visit since his last visit to start the fifth cycle of chemotherapy. He tolerated chemotherapy well, but did develop a fever and did not required hospitalization after the last cycle. He developed a UTI and was hospitalized briefly this week. He is really asymptomatic at this time. He is no longer reporting any fever. He did not report any abdominal pain. He had not reported any chest pain. Had not reported any hematuria. Had not reported any flank pain. Had not reported really any major changes in his performance status or activity level at this time. He has continued to perform activities of daily living without any hindrance or decline. He has continued to drive and attends to again activities of daily living without problems at this point. He still has a foley catheter in place. He is reporting more fatigue at this time. No chest pain or SOB. He still overall weak from his  recent hospitalization.    Medications: I have reviewed the patient's current medications. Current outpatient prescriptions:acetaminophen (TYLENOL) 500 MG tablet, Take 500-1,000 mg by mouth every 6 (six) hours as needed. pain, Disp: , Rfl: ;  amLODipine (NORVASC) 10 MG tablet, Take 0.5 tablets (5 mg total) by mouth every morning., Disp: 30 tablet, Rfl: 0;  finasteride (PROSCAR) 5 MG tablet, Take 5 mg by mouth daily after lunch daily after lunch.  , Disp: , Rfl:  levofloxacin (LEVAQUIN) 500 MG tablet, Take 1 tablet (500 mg total) by mouth daily., Disp: 10 tablet, Rfl: 0;  ondansetron (ZOFRAN) 8 MG tablet, Take 1 tab two times a day starting the day after chemo for 3 days. Then take 1 tab two times a day as needed for nausea or vomiting. , Disp: 30 tablet, Rfl: 1;  saccharomyces boulardii (FLORASTOR) 250 MG capsule, Take 1 capsule (250 mg total) by mouth 2 (two) times daily., Disp: 10 capsule, Rfl: 0 simvastatin (ZOCOR) 80 MG tablet, Take 40 mg by mouth at bedtime. , Disp: , Rfl:  No current facility-administered medications for this visit. Facility-Administered Medications Ordered in Other Visits: heparin lock flush 100 unit/mL, 500 Units, Intracatheter, Prior to discharge, Leroy Sea, MD, 500 Units at 10/27/11 1454;  DISCONTD: 0.9 %  sodium chloride infusion, , Intravenous, Continuous, Penny Pia, MD, Last Rate: 75 mL/hr at 10/27/11 0516;  DISCONTD: acetaminophen (TYLENOL) tablet 500-1,000 mg, 500-1,000 mg, Oral, Q6H PRN, Penny Pia, MD, 1,000 mg at 10/26/11 2304 DISCONTD: amLODipine (NORVASC) tablet 5 mg, 5 mg, Oral, Daily, Penny Pia, MD, 5 mg at 10/27/11 0934;  DISCONTD: atorvastatin (LIPITOR) tablet 40 mg, 40 mg, Oral, q1800, Penny Pia, MD, 40 mg at 10/26/11 1742;  DISCONTD: finasteride (PROSCAR) tablet 5 mg, 5 mg, Oral, QPC lunch, Penny Pia, MD, 5 mg at 10/27/11 1522;  DISCONTD: heparin flush 10 UNIT/ML injection 10 Units, 10 Units, Intracatheter, Once, Leroy Sea, MD DISCONTD:  imipenem-cilastatin (PRIMAXIN) 500 mg in sodium chloride 0.9 % 100 mL IVPB, 500 mg, Intravenous, Q8H, Gwen Her, PHARMD, 500 mg at 10/27/11 0515;  DISCONTD: ondansetron (ZOFRAN) tablet 4 mg, 4 mg, Oral, Q8H PRN, Penny Pia, MD;  DISCONTD: vancomycin (VANCOCIN) IVPB 1000 mg/200 mL premix, 1,000 mg, Intravenous, Q12H, Thuyvan Thi Phan, PHARMD, 1,000 mg at 10/27/11 1610  Allergies:  Allergies  Allergen Reactions  . Morphine And Related Anxiety  . Penicillins Rash    Past Medical History, Surgical history, Social history, and Family History were reviewed and updated.  Review of Systems: Constitutional:  Negative for fever, chills, night sweats, anorexia, weight loss, pain. Cardiovascular: no chest pain or dyspnea on exertion Respiratory: no cough, shortness of breath, or wheezing Neurological: no TIA or stroke symptoms Dermatological: negative ENT: negative Skin: Negative. Gastrointestinal: no abdominal pain, change in bowel habits, or black or bloody stools Genito-Urinary: no dysuria, trouble voiding, or hematuria Hematological and Lymphatic: negative Breast: negative Musculoskeletal: negative Remaining ROS negative. Physical Exam: Blood pressure 150/83, pulse 79, temperature 97.1 F (36.2 C), temperature source Oral, height 5\' 9"  (1.753 m), weight 231 lb 8 oz (105.008 kg). ECOG: 1 General appearance: alert Head: Normocephalic, without obvious abnormality, atraumatic Neck: no adenopathy, no carotid bruit, no JVD, supple, symmetrical, trachea midline and thyroid not enlarged, symmetric, no tenderness/mass/nodules Lymph nodes: Cervical, supraclavicular, and axillary nodes normal. Heart:regular rate and rhythm, S1, S2 normal, no murmur, click, rub or gallop Lung:chest clear, no wheezing, rales, normal symmetric air entry Abdomin: soft, non-tender, without masses or organomegaly EXT:no erythema, induration, or nodules   Lab Results: Lab Results  Component Value Date   WBC  2.7* 10/28/2011   HGB 8.5* 10/28/2011   HCT 26.2* 10/28/2011   MCV 89.7 10/28/2011   PLT 136* 10/28/2011     Chemistry      Component Value Date/Time   NA 140 10/26/2011 0430   K 4.0 10/26/2011 0430   CL 106 10/26/2011 0430   CO2 27 10/26/2011 0430   BUN 14 10/26/2011 0430   CREATININE 1.24 10/26/2011 0430      Component Value Date/Time   CALCIUM 8.3* 10/26/2011 0430   ALKPHOS 49 10/26/2011 0430   AST 20 10/26/2011 0430   ALT 14 10/26/2011 0430   BILITOT 0.4 10/26/2011 0430       Impression and Plan:This is a pleasant 78 year old gentleman with the  following issues:  1. 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 He had a documented invasive tumor of the left renal pelvis, status post left nephroureterectomy, now with recurrence into the bladder neck area, biopsy proven to be muscle invasive. He has also had imaging studies that showed possible lymphadenopathy, as well as lung nodules. PET scan showed positive FDG uptake for malignancy. This is likely represent stage IV disease.  After three cycles of therapy, his tumor have decreased by 50%. The plan is proceed with 3 more cycles. He is not ready to proceed with fifth cycle of chemotherapy due to counts being low and overall weakness. The plan is to push his start of the fifth cycle to next week (3/21) and I will reduce his Gemzar to 800  mg/m2 and his Carboplatin dose by 25%.  2. History of prostate cancer: not active at this point.  3. Follow up: He will receive day 1 cycle 5 on 3/21, day 8 on 3/28. Follow up MD on 4/11 at the start of cycle 6.   4. Fever: This has resolved now.  5. Anemia: This is related to chemotherapy and cancer. The plan is to continue  Aransep 300 mcg every three weeks.     [facility], MD 3/14/20139:23 AM

## 2011-10-31 LAB — CULTURE, BLOOD (ROUTINE X 2)
Culture  Setup Time: 201303111407
Culture: NO GROWTH

## 2011-11-04 ENCOUNTER — Other Ambulatory Visit (HOSPITAL_BASED_OUTPATIENT_CLINIC_OR_DEPARTMENT_OTHER): Payer: Medicare Other | Admitting: Lab

## 2011-11-04 ENCOUNTER — Ambulatory Visit (HOSPITAL_BASED_OUTPATIENT_CLINIC_OR_DEPARTMENT_OTHER): Payer: Medicare Other

## 2011-11-04 VITALS — BP 146/80 | HR 89 | Temp 97.4°F

## 2011-11-04 DIAGNOSIS — C639 Malignant neoplasm of male genital organ, unspecified: Secondary | ICD-10-CM

## 2011-11-04 DIAGNOSIS — C801 Malignant (primary) neoplasm, unspecified: Secondary | ICD-10-CM

## 2011-11-04 DIAGNOSIS — C7800 Secondary malignant neoplasm of unspecified lung: Secondary | ICD-10-CM

## 2011-11-04 DIAGNOSIS — Z5111 Encounter for antineoplastic chemotherapy: Secondary | ICD-10-CM

## 2011-11-04 LAB — CBC WITH DIFFERENTIAL/PLATELET
BASO%: 1.8 % (ref 0.0–2.0)
Basophils Absolute: 0.1 10*3/uL (ref 0.0–0.1)
Eosinophils Absolute: 0.1 10*3/uL (ref 0.0–0.5)
HCT: 30.9 % — ABNORMAL LOW (ref 38.4–49.9)
HGB: 10 g/dL — ABNORMAL LOW (ref 13.0–17.1)
MCHC: 32.4 g/dL (ref 32.0–36.0)
MONO#: 1.2 10*3/uL — ABNORMAL HIGH (ref 0.1–0.9)
NEUT#: 2.1 10*3/uL (ref 1.5–6.5)
NEUT%: 48 % (ref 39.0–75.0)
WBC: 4.5 10*3/uL (ref 4.0–10.3)
lymph#: 1 10*3/uL (ref 0.9–3.3)

## 2011-11-04 LAB — COMPREHENSIVE METABOLIC PANEL
ALT: 10 U/L (ref 0–53)
CO2: 25 mEq/L (ref 19–32)
Calcium: 8.2 mg/dL — ABNORMAL LOW (ref 8.4–10.5)
Chloride: 105 mEq/L (ref 96–112)
Creatinine, Ser: 0.98 mg/dL (ref 0.50–1.35)

## 2011-11-04 MED ORDER — SODIUM CHLORIDE 0.9 % IV SOLN
800.0000 mg/m2 | Freq: Once | INTRAVENOUS | Status: AC
  Administered 2011-11-04: 1786 mg via INTRAVENOUS
  Filled 2011-11-04: qty 47

## 2011-11-04 MED ORDER — SODIUM CHLORIDE 0.9 % IV SOLN
340.0000 mg | Freq: Once | INTRAVENOUS | Status: AC
  Administered 2011-11-04: 340 mg via INTRAVENOUS
  Filled 2011-11-04: qty 34

## 2011-11-04 MED ORDER — HEPARIN SOD (PORK) LOCK FLUSH 100 UNIT/ML IV SOLN
500.0000 [IU] | Freq: Once | INTRAVENOUS | Status: AC | PRN
  Administered 2011-11-04: 500 [IU]
  Filled 2011-11-04: qty 5

## 2011-11-04 MED ORDER — SODIUM CHLORIDE 0.9 % IV SOLN
Freq: Once | INTRAVENOUS | Status: AC
  Administered 2011-11-04: 09:00:00 via INTRAVENOUS

## 2011-11-04 MED ORDER — DEXAMETHASONE SODIUM PHOSPHATE 4 MG/ML IJ SOLN
20.0000 mg | Freq: Once | INTRAMUSCULAR | Status: AC
  Administered 2011-11-04: 20 mg via INTRAVENOUS

## 2011-11-04 MED ORDER — SODIUM CHLORIDE 0.9 % IJ SOLN
10.0000 mL | INTRAMUSCULAR | Status: DC | PRN
  Administered 2011-11-04: 10 mL
  Filled 2011-11-04: qty 10

## 2011-11-04 MED ORDER — ONDANSETRON 16 MG/50ML IVPB (CHCC)
16.0000 mg | Freq: Once | INTRAVENOUS | Status: AC
  Administered 2011-11-04: 16 mg via INTRAVENOUS

## 2011-11-08 ENCOUNTER — Other Ambulatory Visit: Payer: Self-pay | Admitting: *Deleted

## 2011-11-08 DIAGNOSIS — C801 Malignant (primary) neoplasm, unspecified: Secondary | ICD-10-CM

## 2011-11-08 MED ORDER — ONDANSETRON HCL 8 MG PO TABS: ORAL_TABLET | ORAL | Status: AC

## 2011-11-11 ENCOUNTER — Other Ambulatory Visit (HOSPITAL_BASED_OUTPATIENT_CLINIC_OR_DEPARTMENT_OTHER): Payer: Medicare Other | Admitting: Lab

## 2011-11-11 ENCOUNTER — Ambulatory Visit (HOSPITAL_BASED_OUTPATIENT_CLINIC_OR_DEPARTMENT_OTHER): Payer: Medicare Other

## 2011-11-11 VITALS — BP 139/81 | HR 108 | Temp 98.3°F

## 2011-11-11 DIAGNOSIS — N390 Urinary tract infection, site not specified: Secondary | ICD-10-CM

## 2011-11-11 DIAGNOSIS — C649 Malignant neoplasm of unspecified kidney, except renal pelvis: Secondary | ICD-10-CM

## 2011-11-11 DIAGNOSIS — E785 Hyperlipidemia, unspecified: Secondary | ICD-10-CM

## 2011-11-11 DIAGNOSIS — R509 Fever, unspecified: Secondary | ICD-10-CM

## 2011-11-11 DIAGNOSIS — Z8546 Personal history of malignant neoplasm of prostate: Secondary | ICD-10-CM

## 2011-11-11 DIAGNOSIS — T83511A Infection and inflammatory reaction due to indwelling urethral catheter, initial encounter: Secondary | ICD-10-CM

## 2011-11-11 DIAGNOSIS — I10 Essential (primary) hypertension: Secondary | ICD-10-CM

## 2011-11-11 DIAGNOSIS — D649 Anemia, unspecified: Secondary | ICD-10-CM

## 2011-11-11 DIAGNOSIS — Z5111 Encounter for antineoplastic chemotherapy: Secondary | ICD-10-CM

## 2011-11-11 DIAGNOSIS — D696 Thrombocytopenia, unspecified: Secondary | ICD-10-CM

## 2011-11-11 DIAGNOSIS — C801 Malignant (primary) neoplasm, unspecified: Secondary | ICD-10-CM

## 2011-11-11 DIAGNOSIS — C7800 Secondary malignant neoplasm of unspecified lung: Secondary | ICD-10-CM

## 2011-11-11 DIAGNOSIS — N189 Chronic kidney disease, unspecified: Secondary | ICD-10-CM

## 2011-11-11 DIAGNOSIS — C659 Malignant neoplasm of unspecified renal pelvis: Secondary | ICD-10-CM

## 2011-11-11 LAB — CBC WITH DIFFERENTIAL/PLATELET
BASO%: 1.2 % (ref 0.0–2.0)
HCT: 29.1 % — ABNORMAL LOW (ref 38.4–49.9)
MCHC: 33.9 g/dL (ref 32.0–36.0)
MONO#: 0.3 10*3/uL (ref 0.1–0.9)
NEUT%: 75.6 % — ABNORMAL HIGH (ref 39.0–75.0)
RBC: 3.17 10*6/uL — ABNORMAL LOW (ref 4.20–5.82)
WBC: 4.5 10*3/uL (ref 4.0–10.3)
lymph#: 0.8 10*3/uL — ABNORMAL LOW (ref 0.9–3.3)

## 2011-11-11 LAB — COMPREHENSIVE METABOLIC PANEL
ALT: 9 U/L (ref 0–53)
Albumin: 3.6 g/dL (ref 3.5–5.2)
CO2: 25 mEq/L (ref 19–32)
Calcium: 8.4 mg/dL (ref 8.4–10.5)
Chloride: 103 mEq/L (ref 96–112)
Potassium: 4 mEq/L (ref 3.5–5.3)
Sodium: 137 mEq/L (ref 135–145)
Total Protein: 6.1 g/dL (ref 6.0–8.3)

## 2011-11-11 MED ORDER — HEPARIN SOD (PORK) LOCK FLUSH 100 UNIT/ML IV SOLN
500.0000 [IU] | Freq: Once | INTRAVENOUS | Status: AC | PRN
  Administered 2011-11-11: 500 [IU]
  Filled 2011-11-11: qty 5

## 2011-11-11 MED ORDER — PROCHLORPERAZINE MALEATE 10 MG PO TABS
10.0000 mg | ORAL_TABLET | Freq: Once | ORAL | Status: AC
  Administered 2011-11-11: 10 mg via ORAL

## 2011-11-11 MED ORDER — SODIUM CHLORIDE 0.9 % IV SOLN
Freq: Once | INTRAVENOUS | Status: AC
  Administered 2011-11-11: 10:00:00 via INTRAVENOUS

## 2011-11-11 MED ORDER — DARBEPOETIN ALFA-POLYSORBATE 200 MCG/0.4ML IJ SOLN
200.0000 ug | Freq: Once | INTRAMUSCULAR | Status: AC
  Administered 2011-11-11: 200 ug via SUBCUTANEOUS
  Filled 2011-11-11: qty 0.4

## 2011-11-11 MED ORDER — SODIUM CHLORIDE 0.9 % IJ SOLN
10.0000 mL | INTRAMUSCULAR | Status: DC | PRN
  Administered 2011-11-11: 10 mL
  Filled 2011-11-11: qty 10

## 2011-11-11 MED ORDER — GEMCITABINE HCL CHEMO INJECTION 1 GM
800.0000 mg/m2 | Freq: Once | INTRAVENOUS | Status: AC
  Administered 2011-11-11: 1786 mg via INTRAVENOUS
  Filled 2011-11-11: qty 47

## 2011-11-25 ENCOUNTER — Telehealth: Payer: Self-pay | Admitting: Oncology

## 2011-11-25 ENCOUNTER — Ambulatory Visit (HOSPITAL_BASED_OUTPATIENT_CLINIC_OR_DEPARTMENT_OTHER): Payer: Medicare Other

## 2011-11-25 ENCOUNTER — Other Ambulatory Visit: Payer: Medicare Other | Admitting: Lab

## 2011-11-25 ENCOUNTER — Ambulatory Visit (HOSPITAL_BASED_OUTPATIENT_CLINIC_OR_DEPARTMENT_OTHER): Payer: Medicare Other | Admitting: Oncology

## 2011-11-25 VITALS — BP 120/69 | HR 63 | Temp 97.6°F | Ht 69.0 in | Wt 225.5 lb

## 2011-11-25 DIAGNOSIS — C7800 Secondary malignant neoplasm of unspecified lung: Secondary | ICD-10-CM

## 2011-11-25 DIAGNOSIS — Z5111 Encounter for antineoplastic chemotherapy: Secondary | ICD-10-CM

## 2011-11-25 DIAGNOSIS — E785 Hyperlipidemia, unspecified: Secondary | ICD-10-CM

## 2011-11-25 DIAGNOSIS — Z8546 Personal history of malignant neoplasm of prostate: Secondary | ICD-10-CM

## 2011-11-25 DIAGNOSIS — D696 Thrombocytopenia, unspecified: Secondary | ICD-10-CM

## 2011-11-25 DIAGNOSIS — C801 Malignant (primary) neoplasm, unspecified: Secondary | ICD-10-CM

## 2011-11-25 DIAGNOSIS — R509 Fever, unspecified: Secondary | ICD-10-CM

## 2011-11-25 DIAGNOSIS — N390 Urinary tract infection, site not specified: Secondary | ICD-10-CM

## 2011-11-25 DIAGNOSIS — C659 Malignant neoplasm of unspecified renal pelvis: Secondary | ICD-10-CM

## 2011-11-25 DIAGNOSIS — T83511A Infection and inflammatory reaction due to indwelling urethral catheter, initial encounter: Secondary | ICD-10-CM

## 2011-11-25 DIAGNOSIS — N189 Chronic kidney disease, unspecified: Secondary | ICD-10-CM

## 2011-11-25 DIAGNOSIS — T451X5A Adverse effect of antineoplastic and immunosuppressive drugs, initial encounter: Secondary | ICD-10-CM

## 2011-11-25 DIAGNOSIS — D649 Anemia, unspecified: Secondary | ICD-10-CM

## 2011-11-25 DIAGNOSIS — C649 Malignant neoplasm of unspecified kidney, except renal pelvis: Secondary | ICD-10-CM

## 2011-11-25 DIAGNOSIS — I10 Essential (primary) hypertension: Secondary | ICD-10-CM

## 2011-11-25 LAB — CBC WITH DIFFERENTIAL/PLATELET
Eosinophils Absolute: 0.1 10*3/uL (ref 0.0–0.5)
HCT: 31.3 % — ABNORMAL LOW (ref 38.4–49.9)
LYMPH%: 28.1 % (ref 14.0–49.0)
MCHC: 31.6 g/dL — ABNORMAL LOW (ref 32.0–36.0)
MCV: 94 fL (ref 79.3–98.0)
MONO%: 15.6 % — ABNORMAL HIGH (ref 0.0–14.0)
NEUT#: 2.1 10*3/uL (ref 1.5–6.5)
NEUT%: 54 % (ref 39.0–75.0)
Platelets: 227 10*3/uL (ref 140–400)
RBC: 3.33 10*6/uL — ABNORMAL LOW (ref 4.20–5.82)

## 2011-11-25 LAB — COMPREHENSIVE METABOLIC PANEL
ALT: 11 U/L (ref 0–53)
Alkaline Phosphatase: 58 U/L (ref 39–117)
Sodium: 138 mEq/L (ref 135–145)
Total Bilirubin: 0.5 mg/dL (ref 0.3–1.2)
Total Protein: 6.1 g/dL (ref 6.0–8.3)

## 2011-11-25 MED ORDER — SODIUM CHLORIDE 0.9 % IV SOLN
340.0000 mg | Freq: Once | INTRAVENOUS | Status: AC
  Administered 2011-11-25: 340 mg via INTRAVENOUS
  Filled 2011-11-25: qty 34

## 2011-11-25 MED ORDER — SODIUM CHLORIDE 0.9 % IJ SOLN
10.0000 mL | INTRAMUSCULAR | Status: DC | PRN
  Administered 2011-11-25: 10 mL
  Filled 2011-11-25: qty 10

## 2011-11-25 MED ORDER — DEXAMETHASONE SODIUM PHOSPHATE 4 MG/ML IJ SOLN
20.0000 mg | Freq: Once | INTRAMUSCULAR | Status: AC
  Administered 2011-11-25: 20 mg via INTRAVENOUS

## 2011-11-25 MED ORDER — SODIUM CHLORIDE 0.9 % IV SOLN
Freq: Once | INTRAVENOUS | Status: AC
  Administered 2011-11-25: 10:00:00 via INTRAVENOUS

## 2011-11-25 MED ORDER — HEPARIN SOD (PORK) LOCK FLUSH 100 UNIT/ML IV SOLN
500.0000 [IU] | Freq: Once | INTRAVENOUS | Status: AC | PRN
  Administered 2011-11-25: 500 [IU]
  Filled 2011-11-25: qty 5

## 2011-11-25 MED ORDER — SODIUM CHLORIDE 0.9 % IV SOLN
800.0000 mg/m2 | Freq: Once | INTRAVENOUS | Status: AC
  Administered 2011-11-25: 1786 mg via INTRAVENOUS
  Filled 2011-11-25: qty 47

## 2011-11-25 MED ORDER — ONDANSETRON 16 MG/50ML IVPB (CHCC)
16.0000 mg | Freq: Once | INTRAVENOUS | Status: AC
  Administered 2011-11-25: 16 mg via INTRAVENOUS

## 2011-11-25 MED ORDER — DARBEPOETIN ALFA-POLYSORBATE 200 MCG/0.4ML IJ SOLN
200.0000 ug | Freq: Once | INTRAMUSCULAR | Status: AC
  Administered 2011-11-25: 200 ug via SUBCUTANEOUS
  Filled 2011-11-25: qty 0.4

## 2011-11-25 NOTE — Patient Instructions (Signed)
[facility] Discharge Instructions for Patients Receiving Chemotherapy  Today you received the following chemotherapy agents: Gemzar & Carboplatin  To help prevent nausea and vomiting after your treatment, we encourage you to take your nausea medication      Zofran 8 mg-take twice daily x 3 days and then every 8 hours as needed for nausea Begin taking it on 11/26/11 and take it as often as prescribed.   If you develop nausea and vomiting that is not controlled by your nausea medication, call the clinic. If it is after clinic hours your family physician or the after hours number for the clinic or go to the Emergency Department.   BELOW ARE SYMPTOMS THAT SHOULD BE REPORTED IMMEDIATELY:  *FEVER GREATER THAN 100.5 F  *CHILLS WITH OR WITHOUT FEVER  NAUSEA AND VOMITING THAT IS NOT CONTROLLED WITH YOUR NAUSEA MEDICATION  *UNUSUAL SHORTNESS OF BREATH  *UNUSUAL BRUISING OR BLEEDING  TENDERNESS IN MOUTH AND THROAT WITH OR WITHOUT PRESENCE OF ULCERS  *URINARY PROBLEMS  *BOWEL PROBLEMS  UNUSUAL RASH Items with * indicate a potential emergency and should be followed up as soon as possible. Feel free to call the clinic you have any questions or concerns. The clinic phone number is [phone].   I have been informed and understand all the instructions given to me. I know to contact the clinic, my physician, or go to the Emergency Department if any problems should occur. I do not have any questions at this time, but understand that I may call the clinic during office hours   should I have any questions or need assistance in obtaining follow up care.    __________________________________________  _____________  __________ Signature of Patient or Authorized Representative            Date                   Time    __________________________________________ Nurse's Signature

## 2011-11-25 NOTE — Telephone Encounter (Signed)
Gv pt appt for april2013.  scheduled pt for ct scan on 04/25 @ [facility]

## 2011-11-25 NOTE — Progress Notes (Signed)
Hematology and Oncology Follow Up Visit  JERMARION POFFENBERGER [id number] 19-Feb-1927 78 y.o. 11/25/2011 8:55 AM   CC: [name], MD  [name], MD   Principle Diagnosis: This is a pleasant 78 year old gentleman with the following issues:  1. 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 Now has metastatic disease with lung nodules. 2. Prostate cancer: He is s/p treatment with brachytherapy in December 2002. He has had no evidence for cancer recurrence since treatment.    Prior Therapy: He underwent an open left nephroureterectomy done on June 04, 2010. His pathology from that showed [phone], showed that he had a papillary invasive high-grade urothelial carcinoma with negative urinary bladder cuff margins. The tumor was 3.8 cm. No lymphovascular invasion. Again, was a T2 NX disease.   Current therapy: Patient to start day 1, cycle 6 of chemotherapy of Carboplatin and Gemzar.  Interim History: Mr. Dellarocco presents for a follow up visit since his last visit to start the sixth cycle of chemotherapy. He tolerated chemotherapy well, and did not develop a fever and did not requirehospitalization after the last cycle. He is really asymptomatic at this time. He is no longer reporting any fever. He did not report any abdominal pain. He had not reported any chest pain. Had not reported any hematuria. Had not reported any flank pain. Had not reported really any major changes in his performance status or activity level at this time. He has continued to perform activities of daily living without any hindrance or decline. He has continued to drive and attends to again activities of daily living without problems at this point. He still has a foley catheter in place. He is reporting less fatigue at this time. No chest pain or SOB.  He did better after the dose reductions from last cycle.   Medications: I have  reviewed the patient's current medications. Current outpatient prescriptions:acetaminophen (TYLENOL) 500 MG tablet, Take 500-1,000 mg by mouth every 6 (six) hours as needed. pain, Disp: , Rfl: ;  amLODipine (NORVASC) 10 MG tablet, Take 0.5 tablets (5 mg total) by mouth every morning., Disp: 30 tablet, Rfl: 0;  finasteride (PROSCAR) 5 MG tablet, Take 5 mg by mouth daily after lunch daily after lunch.  , Disp: , Rfl:  ondansetron (ZOFRAN) 8 MG tablet, Take 1 tab two times a day starting the day after chemo for 3 days. Then take 1 tab two times a day as needed for nausea or vomiting., Disp: 30 tablet, Rfl: 2;  simvastatin (ZOCOR) 80 MG tablet, Take 40 mg by mouth at bedtime. , Disp: , Rfl:   Allergies:  Allergies  Allergen Reactions  . Morphine And Related Anxiety  . Penicillins Rash    Past Medical History, Surgical history, Social history, and Family History were reviewed and updated.  Review of Systems: Constitutional:  Negative for fever, chills, night sweats, anorexia, weight loss, pain. Cardiovascular: no chest pain or dyspnea on exertion Respiratory: no cough, shortness of breath, or wheezing Neurological: no TIA or stroke symptoms Dermatological: negative ENT: negative Skin: Negative. Gastrointestinal: no abdominal pain, change in bowel habits, or black or bloody stools Genito-Urinary: no dysuria, trouble voiding, or hematuria Hematological and Lymphatic: negative Breast: negative Musculoskeletal: negative Remaining ROS negative. Physical Exam: Blood pressure 120/69, pulse 63, temperature 97.6 F (36.4 C), temperature source Oral, height 5\' 9"  (1.753 m), weight 225 lb 8 oz (102.286 kg). ECOG: 1 General appearance: alert Head: Normocephalic, without  obvious abnormality, atraumatic Neck: no adenopathy, no carotid bruit, no JVD, supple, symmetrical, trachea midline and thyroid not enlarged, symmetric, no tenderness/mass/nodules Lymph nodes: Cervical, supraclavicular, and axillary  nodes normal. Heart:regular rate and rhythm, S1, S2 normal, no murmur, click, rub or gallop Lung:chest clear, no wheezing, rales, normal symmetric air entry Abdomin: soft, non-tender, without masses or organomegaly EXT:no erythema, induration, or nodules   Lab Results: Lab Results  Component Value Date   WBC 3.9* 11/25/2011   HGB 9.9* 11/25/2011   HCT 31.3* 11/25/2011   MCV 94.0 11/25/2011   PLT 227 11/25/2011     Chemistry      Component Value Date/Time   NA 137 11/11/2011 0857   K 4.0 11/11/2011 0857   CL 103 11/11/2011 0857   CO2 25 11/11/2011 0857   BUN 19 11/11/2011 0857   CREATININE 1.12 11/11/2011 0857      Component Value Date/Time   CALCIUM 8.4 11/11/2011 0857   ALKPHOS 59 11/11/2011 0857   AST 16 11/11/2011 0857   ALT 9 11/11/2011 0857   BILITOT 0.9 11/11/2011 0857       Impression and Plan:This is a pleasant 78 year old gentleman with the  following issues:  1. 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 He had a documented invasive tumor of the left renal pelvis, status post left nephroureterectomy, now with recurrence into the bladder neck area, biopsy proven to be muscle invasive. He has also had imaging studies that showed possible lymphadenopathy, as well as lung nodules. PET scan showed positive FDG uptake for malignancy. This is likely represent stage IV disease.  After three cycles of therapy, his tumor have decreased by 50%. The plan is proceed with total of 6 cycles. He is ready to proceed with sixth cycle of chemotherapy. The plan is to proceed with the same doses from the last cycle: Gemzar to 800 mg/m2 and his dose reduced Carboplatin  by 25%.  2. History of prostate cancer: not active at this point.  3. Follow up: He will follow up after a CT scan on 4/30.    4. Fever: This has resolved now.  5. Anemia: This is related to chemotherapy and cancer. The plan is to continue  Aransep 300 mcg every three weeks including today.       [facility], MD 4/11/20138:55 AM

## 2011-12-02 ENCOUNTER — Ambulatory Visit (HOSPITAL_BASED_OUTPATIENT_CLINIC_OR_DEPARTMENT_OTHER): Payer: Medicare Other

## 2011-12-02 ENCOUNTER — Other Ambulatory Visit (HOSPITAL_BASED_OUTPATIENT_CLINIC_OR_DEPARTMENT_OTHER): Payer: Medicare Other | Admitting: Lab

## 2011-12-02 VITALS — BP 137/78 | HR 92 | Temp 97.5°F

## 2011-12-02 DIAGNOSIS — C7800 Secondary malignant neoplasm of unspecified lung: Secondary | ICD-10-CM

## 2011-12-02 DIAGNOSIS — C801 Malignant (primary) neoplasm, unspecified: Secondary | ICD-10-CM

## 2011-12-02 DIAGNOSIS — Z5111 Encounter for antineoplastic chemotherapy: Secondary | ICD-10-CM

## 2011-12-02 LAB — CBC WITH DIFFERENTIAL/PLATELET
BASO%: 2.8 % — ABNORMAL HIGH (ref 0.0–2.0)
EOS%: 0.8 % (ref 0.0–7.0)
LYMPH%: 28.9 % (ref 14.0–49.0)
MCH: 30.1 pg (ref 27.2–33.4)
MCHC: 32.1 g/dL (ref 32.0–36.0)
MONO#: 0.2 10*3/uL (ref 0.1–0.9)
MONO%: 9.3 % (ref 0.0–14.0)
NEUT%: 58.2 % (ref 39.0–75.0)
Platelets: 104 10*3/uL — ABNORMAL LOW (ref 140–400)
RBC: 3.06 10*6/uL — ABNORMAL LOW (ref 4.20–5.82)
WBC: 2.5 10*3/uL — ABNORMAL LOW (ref 4.0–10.3)
nRBC: 1 % — ABNORMAL HIGH (ref 0–0)

## 2011-12-02 LAB — COMPREHENSIVE METABOLIC PANEL
ALT: 16 U/L (ref 0–53)
AST: 23 U/L (ref 0–37)
Alkaline Phosphatase: 53 U/L (ref 39–117)
BUN: 11 mg/dL (ref 6–23)
Chloride: 107 mEq/L (ref 96–112)
Creatinine, Ser: 0.96 mg/dL (ref 0.50–1.35)
Total Bilirubin: 0.7 mg/dL (ref 0.3–1.2)

## 2011-12-02 MED ORDER — SODIUM CHLORIDE 0.9 % IV SOLN: Freq: Once | INTRAVENOUS | Status: DC

## 2011-12-02 MED ORDER — SODIUM CHLORIDE 0.9 % IJ SOLN
10.0000 mL | INTRAMUSCULAR | Status: DC | PRN
  Administered 2011-12-02: 10 mL
  Filled 2011-12-02: qty 10

## 2011-12-02 MED ORDER — PROCHLORPERAZINE MALEATE 10 MG PO TABS
10.0000 mg | ORAL_TABLET | Freq: Once | ORAL | Status: AC
  Administered 2011-12-02: 10 mg via ORAL

## 2011-12-02 MED ORDER — HEPARIN SOD (PORK) LOCK FLUSH 100 UNIT/ML IV SOLN
500.0000 [IU] | Freq: Once | INTRAVENOUS | Status: AC | PRN
  Administered 2011-12-02: 500 [IU]
  Filled 2011-12-02: qty 5

## 2011-12-02 MED ORDER — SODIUM CHLORIDE 0.9 % IV SOLN
800.0000 mg/m2 | Freq: Once | INTRAVENOUS | Status: AC
  Administered 2011-12-02: 1786 mg via INTRAVENOUS
  Filled 2011-12-02: qty 47

## 2011-12-02 NOTE — Patient Instructions (Signed)
[facility] Discharge Instructions for Patients Receiving Chemotherapy  Today you received the following chemotherapy agents Gemzar  To help prevent nausea and vomiting after your treatment, we encourage you to take your nausea medication   If you develop nausea and vomiting that is not controlled by your nausea medication, call the clinic. If it is after clinic hours your family physician or the after hours number for the clinic or go to the Emergency Department.   BELOW ARE SYMPTOMS THAT SHOULD BE REPORTED IMMEDIATELY:  *FEVER GREATER THAN 100.5 F  *CHILLS WITH OR WITHOUT FEVER  NAUSEA AND VOMITING THAT IS NOT CONTROLLED WITH YOUR NAUSEA MEDICATION  *UNUSUAL SHORTNESS OF BREATH  *UNUSUAL BRUISING OR BLEEDING  TENDERNESS IN MOUTH AND THROAT WITH OR WITHOUT PRESENCE OF ULCERS  *URINARY PROBLEMS  *BOWEL PROBLEMS  UNUSUAL RASH Items with * indicate a potential emergency and should be followed up as soon as possible.   I have been informed and understand all the instructions given to me. I know to contact the clinic, my physician, or go to the Emergency Department if any problems should occur. I do not have any questions at this time, but understand that I may call the clinic during office hours   should I have any questions or need assistance in obtaining follow up care.    __________________________________________  _____________  __________ Signature of Patient or Authorized Representative            Date                   Time    __________________________________________ Nurse's Signature

## 2011-12-09 ENCOUNTER — Ambulatory Visit (HOSPITAL_COMMUNITY)
Admission: RE | Admit: 2011-12-09 | Discharge: 2011-12-09 | Disposition: A | Discharge status: Home / Self Care | Payer: Medicare Other | Source: Ambulatory Visit | Attending: Oncology | Admitting: Oncology

## 2011-12-09 DIAGNOSIS — Z8546 Personal history of malignant neoplasm of prostate: Secondary | ICD-10-CM | POA: Insufficient documentation

## 2011-12-09 DIAGNOSIS — Z9049 Acquired absence of other specified parts of digestive tract: Secondary | ICD-10-CM | POA: Insufficient documentation

## 2011-12-09 DIAGNOSIS — M47817 Spondylosis without myelopathy or radiculopathy, lumbosacral region: Secondary | ICD-10-CM | POA: Insufficient documentation

## 2011-12-09 DIAGNOSIS — C801 Malignant (primary) neoplasm, unspecified: Secondary | ICD-10-CM

## 2011-12-09 DIAGNOSIS — J984 Other disorders of lung: Secondary | ICD-10-CM | POA: Insufficient documentation

## 2011-12-09 DIAGNOSIS — I700 Atherosclerosis of aorta: Secondary | ICD-10-CM | POA: Insufficient documentation

## 2011-12-09 DIAGNOSIS — K8020 Calculus of gallbladder without cholecystitis without obstruction: Secondary | ICD-10-CM | POA: Insufficient documentation

## 2011-12-09 DIAGNOSIS — I708 Atherosclerosis of other arteries: Secondary | ICD-10-CM | POA: Insufficient documentation

## 2011-12-09 DIAGNOSIS — I2510 Atherosclerotic heart disease of native coronary artery without angina pectoris: Secondary | ICD-10-CM | POA: Insufficient documentation

## 2011-12-09 DIAGNOSIS — C649 Malignant neoplasm of unspecified kidney, except renal pelvis: Secondary | ICD-10-CM | POA: Insufficient documentation

## 2011-12-09 DIAGNOSIS — C7800 Secondary malignant neoplasm of unspecified lung: Secondary | ICD-10-CM

## 2011-12-09 DIAGNOSIS — Z905 Acquired absence of kidney: Secondary | ICD-10-CM | POA: Insufficient documentation

## 2011-12-09 MED ORDER — IOHEXOL 300 MG/ML  SOLN
100.0000 mL | Freq: Once | INTRAMUSCULAR | Status: AC | PRN
  Administered 2011-12-09: 100 mL via INTRAVENOUS

## 2011-12-14 ENCOUNTER — Telehealth: Payer: Self-pay | Admitting: Oncology

## 2011-12-14 ENCOUNTER — Ambulatory Visit (HOSPITAL_BASED_OUTPATIENT_CLINIC_OR_DEPARTMENT_OTHER): Payer: Medicare Other | Admitting: Oncology

## 2011-12-14 ENCOUNTER — Other Ambulatory Visit (HOSPITAL_BASED_OUTPATIENT_CLINIC_OR_DEPARTMENT_OTHER): Payer: Medicare Other | Admitting: Lab

## 2011-12-14 VITALS — BP 133/72 | HR 92 | Temp 96.7°F | Ht 69.0 in | Wt 233.1 lb

## 2011-12-14 DIAGNOSIS — C7800 Secondary malignant neoplasm of unspecified lung: Secondary | ICD-10-CM

## 2011-12-14 DIAGNOSIS — C649 Malignant neoplasm of unspecified kidney, except renal pelvis: Secondary | ICD-10-CM

## 2011-12-14 DIAGNOSIS — D6481 Anemia due to antineoplastic chemotherapy: Secondary | ICD-10-CM

## 2011-12-14 DIAGNOSIS — Z8546 Personal history of malignant neoplasm of prostate: Secondary | ICD-10-CM

## 2011-12-14 DIAGNOSIS — C801 Malignant (primary) neoplasm, unspecified: Secondary | ICD-10-CM

## 2011-12-14 DIAGNOSIS — C659 Malignant neoplasm of unspecified renal pelvis: Secondary | ICD-10-CM

## 2011-12-14 DIAGNOSIS — C7919 Secondary malignant neoplasm of other urinary organs: Secondary | ICD-10-CM

## 2011-12-14 LAB — CBC WITH DIFFERENTIAL/PLATELET
Basophils Absolute: 0 10*3/uL (ref 0.0–0.1)
EOS%: 2.3 % (ref 0.0–7.0)
HCT: 26.2 % — ABNORMAL LOW (ref 38.4–49.9)
HGB: 8.2 g/dL — ABNORMAL LOW (ref 13.0–17.1)
MCH: 30.1 pg (ref 27.2–33.4)
MCV: 96.3 fL (ref 79.3–98.0)
MONO%: 21.2 % — ABNORMAL HIGH (ref 0.0–14.0)
NEUT%: 41.8 % (ref 39.0–75.0)
RDW: 19 % — ABNORMAL HIGH (ref 11.0–14.6)

## 2011-12-14 LAB — COMPREHENSIVE METABOLIC PANEL
AST: 22 U/L (ref 0–37)
Alkaline Phosphatase: 64 U/L (ref 39–117)
BUN: 13 mg/dL (ref 6–23)
Creatinine, Ser: 1.13 mg/dL (ref 0.50–1.35)

## 2011-12-14 MED ORDER — ABIRATERONE ACETATE 250 MG PO TABS: 1000.0000 mg | ORAL_TABLET | Freq: Every day | ORAL | Status: DC

## 2011-12-14 NOTE — Progress Notes (Signed)
Hematology and Oncology Follow Up Visit  [name] [id number] Jul 11, 1927 78 y.o. 12/14/2011 4:17 PM   CC: [name], MD  [name], MD   Principle Diagnosis: This is a pleasant 78 year old gentleman with the following issues:  1. 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 Now has metastatic disease with lung nodules. 2. Prostate cancer: He is s/p treatment with brachytherapy in December 2002. He has had no evidence for cancer recurrence since treatment.    Prior Therapy: He underwent an open left nephroureterectomy done on June 04, 2010. His pathology from that showed [phone], showed that he had a papillary invasive high-grade urothelial carcinoma with negative urinary bladder cuff margins. The tumor was 3.8 cm. No lymphovascular invasion. Again, was a T2 NX disease.   Current therapy:  S/P the six cycle of  chemotherapy of Carboplatin and Gemzar. Day 8 cycle 6 was on 4/18.   Interim History: [name] presents for a follow up visit since his last visit after he completed the  sixth cycle of chemotherapy. He tolerated chemotherapy well, and did not develop a fever and did not require hospitalization after the last cycle. He is really asymptomatic at this time. He is no longer reporting any fever. He did not report any abdominal pain. He had not reported any chest pain. Had not reported any hematuria. Had not reported any flank pain. Had not reported really any major changes in his performance status or activity level at this time. He has continued to perform activities of daily living without any hindrance or decline. He has continued to drive and attends to again activities of daily living without problems at this point. He still has a foley catheter in place. He is reporting less fatigue at this time and feels that he is getting better slowly.     Medications: I have reviewed the  patient's current medications. Current outpatient prescriptions:acetaminophen (TYLENOL) 500 MG tablet, Take 500-1,000 mg by mouth every 6 (six) hours as needed. pain, Disp: , Rfl: ;  amLODipine (NORVASC) 10 MG tablet, Take 0.5 tablets (5 mg total) by mouth every morning., Disp: 30 tablet, Rfl: 0;  finasteride (PROSCAR) 5 MG tablet, Take 5 mg by mouth daily after lunch daily after lunch.  , Disp: , Rfl:  ondansetron (ZOFRAN) 8 MG tablet, Take 1 tab two times a day starting the day after chemo for 3 days. Then take 1 tab two times a day as needed for nausea or vomiting., Disp: 30 tablet, Rfl: 2;  simvastatin (ZOCOR) 80 MG tablet, Take 40 mg by mouth at bedtime. , Disp: , Rfl:   Allergies:  Allergies  Allergen Reactions  . Morphine And Related Anxiety  . Penicillins Rash    Past Medical History, Surgical history, Social history, and Family History were reviewed and updated.  Review of Systems: Constitutional:  Negative for fever, chills, night sweats, anorexia, weight loss, pain. Cardiovascular: no chest pain or dyspnea on exertion Respiratory: no cough, shortness of breath, or wheezing Neurological: no TIA or stroke symptoms Dermatological: negative ENT: negative Skin: Negative. Gastrointestinal: no abdominal pain, change in bowel habits, or black or bloody stools Genito-Urinary: no dysuria, trouble voiding, or hematuria Hematological and Lymphatic: negative Breast: negative Musculoskeletal: negative Remaining ROS negative. Physical Exam: Blood pressure 133/72, pulse 92, temperature 96.7 F (35.9 C), temperature source Oral, height 5\' 9"  (1.753 m), weight 233 lb 1.6 oz (105.733 kg). ECOG: 1 General appearance:  alert Head: Normocephalic, without obvious abnormality, atraumatic Neck: no adenopathy, no carotid bruit, no JVD, supple, symmetrical, trachea midline and thyroid not enlarged, symmetric, no tenderness/mass/nodules Lymph nodes: Cervical, supraclavicular, and axillary nodes  normal. Heart:regular rate and rhythm, S1, S2 normal, no murmur, click, rub or gallop Lung:chest clear, no wheezing, rales, normal symmetric air entry Abdomin: soft, non-tender, without masses or organomegaly EXT:no erythema, induration, or nodules   Lab Results: Lab Results  Component Value Date   WBC 2.2* 12/14/2011   HGB 8.2* 12/14/2011   HCT 26.2* 12/14/2011   MCV 96.3 12/14/2011   PLT 78* 12/14/2011     Chemistry      Component Value Date/Time   NA 139 12/02/2011 0840   K 4.0 12/02/2011 0840   CL 107 12/02/2011 0840   CO2 24 12/02/2011 0840   BUN 11 12/02/2011 0840   CREATININE 0.96 12/02/2011 0840      Component Value Date/Time   CALCIUM 8.2* 12/02/2011 0840   ALKPHOS 53 12/02/2011 0840   AST 23 12/02/2011 0840   ALT 16 12/02/2011 0840   BILITOT 0.7 12/02/2011 0840     CT CHEST, ABDOMEN AND PELVIS WITH CONTRAST  Technique: Multidetector CT imaging of the chest, abdomen and  pelvis was performed following the standard protocol during bolus  administration of intravenous contrast.  Contrast: OMNIPAQUE IOHEXOL 300 MG/ML SOLN  Comparison: Prior examinations 09/30/2011 and PET CT 07/20/2011.  CT CHEST  Findings: Right IJ Port-A-Cath appears unchanged. There are no  enlarged mediastinal or hilar lymph nodes. There is stable  atherosclerosis of the aorta, great vessels and coronary arteries.  There is enhancement of collateral venous structures in the upper  right chest which may be secondary to central venous stenosis. The  right brachiocephalic vein and SVC are patent.  The tiny irregular nodular densities in the left lung are best seen  on image 33 and appear unchanged. These are more obvious on the  reformatted images. There is also a stable small nodular density  in the superior segment of the left lower lobe on image 20. No new  or enlarging pulmonary nodules are identified. No acute or  suspicious osseous findings are seen. There is a stable chondroid  lesion within  the proximal right humerus.  IMPRESSION:  1. Stable minimal residual left lung nodularity at site of treated  metastases.  2. No progressive disease identified.  CT ABDOMEN AND PELVIS  Findings: No suspicious liver lesions are identified. A small  hypervascular lesion on image 59 is less well seen on the most  recent study but appears unchanged from 01/04/2011. This is likely  an incidental vascular phenomenon. There are tiny gallstones. The  gallbladder, pancreas, biliary system and spleen otherwise appear  normal. Both adrenal glands appear normal. The right kidney has a  stable appearance without abnormal enhancement. A tiny low density  lesion anteriorly is best seen on series 4 image 22 and is stable.  There is no mass within the left nephrectomy bed.  There is no lymphadenopathy, ascites or peritoneal nodularity.  Previously referenced small node at the base of the mesentery on  image 89 is unchanged and not enlarged. Extensive chronic  diverticular changes of the descending and sigmoid colon are noted.  There is some surrounding soft tissue stranding which appears  chronic and unchanged. Foley catheter is in place. Prostate  brachytherapy seeds appear unchanged. There is stable aorto iliac  atherosclerosis.  The lumbar spine has a stable appearance with moderate spondylosis  and a chronic anterior compression deformity at L1. 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 The patient is  status post right total hip arthroplasty.  IMPRESSION:  1. Stable abdominal pelvic CT. No evidence of metastatic disease  status post left nephrectomy.  2. Stable cholelithiasis with chronic diverticulosis of the  descending and sigmoid colon.  3. Stable lumbar spondylosis and left femoral head osteonecrosis.       Impression and Plan:This is a pleasant 78 year old gentleman with the following issues:    1. 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  He had a documented invasive tumor of the left renal pelvis, status post left nephroureterectomy, now with recurrence into the bladder neck area, biopsy proven to be muscle invasive. He has also had imaging studies that showed possible lymphadenopathy, as well as lung nodules. PET scan showed positive FDG uptake for malignancy. This is likely represent stage IV disease.  After Six cycles of therapy, his tumor showed a complete response to chemotherapy.  For now, the plan is to go on active surveillance with repeat imaging studies in 6 months.     2. History of prostate cancer: not active at this point.  3. Follow up: in 3 months, with port flush every 6 weeks.   4. Fever: This has resolved now.  5. Anemia: This is related to chemotherapy and cancer. The plan is to continue  Aransep 300 mcg as needed. I anticipate this improve as he is done with the completion of chemotherapy.      Eli Hose, MD 4/30/20134:17 PM

## 2011-12-14 NOTE — Telephone Encounter (Signed)
Gave pt appt for June and July 2013, lab , flush and MD

## 2012-01-05 ENCOUNTER — Telehealth: Payer: Self-pay | Admitting: *Deleted

## 2012-01-05 NOTE — Telephone Encounter (Signed)
Patients wife wanted to know if it is okay to resume taking daily multivitamins since patient is no longer taking chemotherapy.  Per Belenda Cruise, NP, okay to resume.  Patient's wife made aware, verbalized understanding.

## 2012-01-25 ENCOUNTER — Ambulatory Visit (HOSPITAL_BASED_OUTPATIENT_CLINIC_OR_DEPARTMENT_OTHER): Payer: Medicare Other

## 2012-01-25 ENCOUNTER — Other Ambulatory Visit: Payer: Medicare Other | Admitting: Lab

## 2012-01-25 ENCOUNTER — Other Ambulatory Visit (HOSPITAL_BASED_OUTPATIENT_CLINIC_OR_DEPARTMENT_OTHER): Payer: Medicare Other

## 2012-01-25 DIAGNOSIS — Z452 Encounter for adjustment and management of vascular access device: Secondary | ICD-10-CM

## 2012-01-25 DIAGNOSIS — C649 Malignant neoplasm of unspecified kidney, except renal pelvis: Secondary | ICD-10-CM

## 2012-01-25 DIAGNOSIS — C679 Malignant neoplasm of bladder, unspecified: Secondary | ICD-10-CM

## 2012-01-25 DIAGNOSIS — C659 Malignant neoplasm of unspecified renal pelvis: Secondary | ICD-10-CM

## 2012-01-25 DIAGNOSIS — C7919 Secondary malignant neoplasm of other urinary organs: Secondary | ICD-10-CM

## 2012-01-25 DIAGNOSIS — C7800 Secondary malignant neoplasm of unspecified lung: Secondary | ICD-10-CM

## 2012-01-25 LAB — CBC WITH DIFFERENTIAL/PLATELET
Basophils Absolute: 0.1 10*3/uL (ref 0.0–0.1)
EOS%: 5 % (ref 0.0–7.0)
HCT: 35.5 % — ABNORMAL LOW (ref 38.4–49.9)
HGB: 11.7 g/dL — ABNORMAL LOW (ref 13.0–17.1)
MCH: 31.3 pg (ref 27.2–33.4)
MCV: 94.9 fL (ref 79.3–98.0)
MONO%: 9.6 % (ref 0.0–14.0)
NEUT%: 54 % (ref 39.0–75.0)

## 2012-01-25 LAB — COMPREHENSIVE METABOLIC PANEL
AST: 21 U/L (ref 0–37)
Alkaline Phosphatase: 55 U/L (ref 39–117)
BUN: 16 mg/dL (ref 6–23)
Calcium: 8.9 mg/dL (ref 8.4–10.5)
Chloride: 102 mEq/L (ref 96–112)
Creatinine, Ser: 1.17 mg/dL (ref 0.50–1.35)

## 2012-01-25 MED ORDER — HEPARIN SOD (PORK) LOCK FLUSH 100 UNIT/ML IV SOLN
500.0000 [IU] | Freq: Once | INTRAVENOUS | Status: AC
  Administered 2012-01-25: 500 [IU] via INTRAVENOUS
  Filled 2012-01-25: qty 5

## 2012-01-25 MED ORDER — SODIUM CHLORIDE 0.9 % IJ SOLN
10.0000 mL | INTRAMUSCULAR | Status: DC | PRN
  Administered 2012-01-25: 10 mL via INTRAVENOUS
  Filled 2012-01-25: qty 10

## 2012-03-14 ENCOUNTER — Ambulatory Visit (HOSPITAL_BASED_OUTPATIENT_CLINIC_OR_DEPARTMENT_OTHER): Payer: Medicare Other | Admitting: Oncology

## 2012-03-14 ENCOUNTER — Telehealth: Payer: Self-pay | Admitting: Oncology

## 2012-03-14 ENCOUNTER — Other Ambulatory Visit (HOSPITAL_BASED_OUTPATIENT_CLINIC_OR_DEPARTMENT_OTHER): Payer: Medicare Other | Admitting: Lab

## 2012-03-14 VITALS — BP 132/76 | HR 89 | Temp 98.5°F | Ht 69.0 in | Wt 225.8 lb

## 2012-03-14 DIAGNOSIS — T451X5A Adverse effect of antineoplastic and immunosuppressive drugs, initial encounter: Secondary | ICD-10-CM

## 2012-03-14 DIAGNOSIS — C7919 Secondary malignant neoplasm of other urinary organs: Secondary | ICD-10-CM

## 2012-03-14 DIAGNOSIS — D6481 Anemia due to antineoplastic chemotherapy: Secondary | ICD-10-CM

## 2012-03-14 DIAGNOSIS — C7800 Secondary malignant neoplasm of unspecified lung: Secondary | ICD-10-CM

## 2012-03-14 DIAGNOSIS — C639 Malignant neoplasm of male genital organ, unspecified: Secondary | ICD-10-CM

## 2012-03-14 DIAGNOSIS — C649 Malignant neoplasm of unspecified kidney, except renal pelvis: Secondary | ICD-10-CM

## 2012-03-14 DIAGNOSIS — C61 Malignant neoplasm of prostate: Secondary | ICD-10-CM

## 2012-03-14 LAB — COMPREHENSIVE METABOLIC PANEL
Albumin: 4 g/dL (ref 3.5–5.2)
BUN: 20 mg/dL (ref 6–23)
CO2: 25 mEq/L (ref 19–32)
Calcium: 8.9 mg/dL (ref 8.4–10.5)
Chloride: 102 mEq/L (ref 96–112)
Glucose, Bld: 132 mg/dL — ABNORMAL HIGH (ref 70–99)
Potassium: 4 mEq/L (ref 3.5–5.3)

## 2012-03-14 LAB — CBC WITH DIFFERENTIAL/PLATELET
Basophils Absolute: 0.1 10*3/uL (ref 0.0–0.1)
Eosinophils Absolute: 0.1 10*3/uL (ref 0.0–0.5)
HCT: 37.6 % — ABNORMAL LOW (ref 38.4–49.9)
HGB: 12.8 g/dL — ABNORMAL LOW (ref 13.0–17.1)
NEUT#: 3.4 10*3/uL (ref 1.5–6.5)
NEUT%: 59.6 % (ref 39.0–75.0)
RDW: 15.6 % — ABNORMAL HIGH (ref 11.0–14.6)
lymph#: 1.6 10*3/uL (ref 0.9–3.3)

## 2012-03-14 MED ORDER — SODIUM CHLORIDE 0.9 % IJ SOLN
10.0000 mL | INTRAMUSCULAR | Status: DC | PRN
  Administered 2012-03-14: 10 mL via INTRAVENOUS
  Filled 2012-03-14: qty 10

## 2012-03-14 MED ORDER — HEPARIN SOD (PORK) LOCK FLUSH 100 UNIT/ML IV SOLN
500.0000 [IU] | Freq: Once | INTRAVENOUS | Status: AC
  Administered 2012-03-14: 500 [IU] via INTRAVENOUS
  Filled 2012-03-14: qty 5

## 2012-03-14 NOTE — Telephone Encounter (Signed)
gv pt appt schedule for September thru November including ct for 10/31 - ct d/t per pt.

## 2012-03-14 NOTE — Progress Notes (Signed)
Hematology and Oncology Follow Up Visit  GEARL BARATTA [id number] February 13, 1927 78 y.o. 03/14/2012 3:09 PM   CC: [name], MD  [name], MD   Principle Diagnosis: This is a pleasant 78 year old gentleman with the following issues:  1. 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 Now has metastatic disease with lung nodules. 2. Prostate cancer: He is s/p treatment with brachytherapy in December 2002. He has had no evidence for cancer recurrence since treatment.   Prior Therapy:  1. He underwent an open left nephroureterectomy done on June 04, 2010. His pathology from that showed [phone], showed that he had a papillary invasive high-grade urothelial carcinoma with negative urinary bladder cuff margins. The tumor was 3.8 cm. No lymphovascular invasion. Again, was a T2 NX disease.  S/P the six cycle of  chemotherapy of Carboplatin and Gemzar. Day 8 cycle 6 was on 4/18.   2. S/P the six cycle of  chemotherapy of Carboplatin and Gemzar. Completed on 12/02/2011.   Current therapy:  Observation and follow up.   Interim History: Mr. Duffus presents for a follow up visit. He completed the sixth cycle of chemotherapy 3 months ago and have recovered well at this time. He is really asymptomatic at this time. He is no longer reporting any fever. He did not report any abdominal pain. He had not reported any chest pain. Had not reported any hematuria. Had not reported any flank pain. Had not reported really any major changes in his performance status or activity level at this time. He has continued to perform activities of daily living without any hindrance or decline. He has continued to drive and attends to again activities of daily living without problems at this point. He still has a foley catheter in place. He is reporting less fatigue at this time and feels that he is continuing to get better.      Medications: I have reviewed the patient's current medications. Current outpatient prescriptions:acetaminophen (TYLENOL) 500 MG tablet, Take 500-1,000 mg by mouth every 6 (six) hours as needed. pain, Disp: , Rfl: ;  amLODipine (NORVASC) 10 MG tablet, Take 0.5 tablets (5 mg total) by mouth every morning., Disp: 30 tablet, Rfl: 0;  finasteride (PROSCAR) 5 MG tablet, Take 5 mg by mouth daily after lunch daily after lunch.  , Disp: , Rfl:  ondansetron (ZOFRAN) 8 MG tablet, Take 1 tab two times a day starting the day after chemo for 3 days. Then take 1 tab two times a day as needed for nausea or vomiting., Disp: 30 tablet, Rfl: 2;  simvastatin (ZOCOR) 80 MG tablet, Take 40 mg by mouth at bedtime. , Disp: , Rfl:   Allergies:  Allergies  Allergen Reactions  . Morphine And Related Anxiety  . Penicillins Rash    Past Medical History, Surgical history, Social history, and Family History were reviewed and updated.  Review of Systems: Constitutional:  Negative for fever, chills, night sweats, anorexia, weight loss, pain. Cardiovascular: no chest pain or dyspnea on exertion Respiratory: no cough, shortness of breath, or wheezing Neurological: no TIA or stroke symptoms Dermatological: negative ENT: negative Skin: Negative. Gastrointestinal: no abdominal pain, change in bowel habits, or black or bloody stools Genito-Urinary: no dysuria, trouble voiding, or hematuria Hematological and Lymphatic: negative Breast: negative Musculoskeletal: negative Remaining ROS negative. Physical Exam: Blood pressure 132/76, pulse 89, temperature 98.5 F (36.9 C), temperature source Oral, height 5\' 9"  (1.753 m), weight 225  lb 12.8 oz (102.422 kg). ECOG: 1 General appearance: alert Head: Normocephalic, without obvious abnormality, atraumatic Neck: no adenopathy, no carotid bruit, no JVD, supple, symmetrical, trachea midline and thyroid not enlarged, symmetric, no tenderness/mass/nodules Lymph nodes: Cervical,  supraclavicular, and axillary nodes normal. Heart:regular rate and rhythm, S1, S2 normal, no murmur, click, rub or gallop Lung:chest clear, no wheezing, rales, normal symmetric air entry Abdomin: soft, non-tender, without masses or organomegaly EXT:no erythema, induration, or nodules   Lab Results: Lab Results  Component Value Date   WBC 5.7 03/14/2012   HGB 12.8* 03/14/2012   HCT 37.6* 03/14/2012   MCV 92.1 03/14/2012   PLT 139* 03/14/2012     Chemistry      Component Value Date/Time   NA 137 01/25/2012 1253   K 4.3 01/25/2012 1253   CL 102 01/25/2012 1253   CO2 25 01/25/2012 1253   BUN 16 01/25/2012 1253   CREATININE 1.17 01/25/2012 1253      Component Value Date/Time   CALCIUM 8.9 01/25/2012 1253   ALKPHOS 55 01/25/2012 1253   AST 21 01/25/2012 1253   ALT 13 01/25/2012 1253   BILITOT 0.4 01/25/2012 1253      Impression and Plan:This is a pleasant 78 year old gentleman with the following issues:   1. 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 He had a documented invasive tumor of the left renal pelvis, status post left nephroureterectomy, now with recurrence into the bladder neck area, biopsy proven to be muscle invasive. He has also had imaging studies that showed possible lymphadenopathy, as well as lung nodules. PET scan showed positive FDG uptake for malignancy. This is likely represent stage IV disease. After Six cycles of therapy, his tumor showed a complete response to chemotherapy.  For now, the plan is to go on active surveillance with repeat imaging studies in 3 months. Last CT scan was in 11/2011.  2. History of prostate cancer: not active at this point.  3. Follow up: in 3 months, with port flush every 6 weeks.   4. Anemia: This is related to chemotherapy and cancer. This has resolved now.      [facility], MD 7/30/20133:09 PM

## 2012-03-16 ENCOUNTER — Telehealth: Payer: Self-pay | Admitting: *Deleted

## 2012-03-16 NOTE — Telephone Encounter (Signed)
Patient has all ready had appointments scheduled in epic as of 03-16-2012

## 2012-04-06 ENCOUNTER — Encounter (HOSPITAL_COMMUNITY): Payer: Self-pay | Admitting: Emergency Medicine

## 2012-04-06 ENCOUNTER — Emergency Department (HOSPITAL_COMMUNITY): Payer: Medicare Other

## 2012-04-06 ENCOUNTER — Emergency Department (HOSPITAL_COMMUNITY)
Admission: EM | Admit: 2012-04-06 | Discharge: 2012-04-06 | Disposition: A | Discharge status: Home / Self Care | Payer: Medicare Other | Attending: Emergency Medicine | Admitting: Emergency Medicine

## 2012-04-06 DIAGNOSIS — S61409A Unspecified open wound of unspecified hand, initial encounter: Secondary | ICD-10-CM | POA: Insufficient documentation

## 2012-04-06 DIAGNOSIS — Z8551 Personal history of malignant neoplasm of bladder: Secondary | ICD-10-CM | POA: Insufficient documentation

## 2012-04-06 DIAGNOSIS — S0990XA Unspecified injury of head, initial encounter: Secondary | ICD-10-CM | POA: Insufficient documentation

## 2012-04-06 DIAGNOSIS — I129 Hypertensive chronic kidney disease with stage 1 through stage 4 chronic kidney disease, or unspecified chronic kidney disease: Secondary | ICD-10-CM | POA: Insufficient documentation

## 2012-04-06 DIAGNOSIS — Z79899 Other long term (current) drug therapy: Secondary | ICD-10-CM | POA: Insufficient documentation

## 2012-04-06 DIAGNOSIS — W19XXXA Unspecified fall, initial encounter: Secondary | ICD-10-CM | POA: Insufficient documentation

## 2012-04-06 DIAGNOSIS — Z466 Encounter for fitting and adjustment of urinary device: Secondary | ICD-10-CM

## 2012-04-06 DIAGNOSIS — S8000XA Contusion of unspecified knee, initial encounter: Secondary | ICD-10-CM | POA: Insufficient documentation

## 2012-04-06 DIAGNOSIS — M47812 Spondylosis without myelopathy or radiculopathy, cervical region: Secondary | ICD-10-CM | POA: Insufficient documentation

## 2012-04-06 DIAGNOSIS — S0003XA Contusion of scalp, initial encounter: Secondary | ICD-10-CM | POA: Insufficient documentation

## 2012-04-06 DIAGNOSIS — N390 Urinary tract infection, site not specified: Secondary | ICD-10-CM | POA: Insufficient documentation

## 2012-04-06 DIAGNOSIS — N189 Chronic kidney disease, unspecified: Secondary | ICD-10-CM | POA: Insufficient documentation

## 2012-04-06 DIAGNOSIS — Y9289 Other specified places as the place of occurrence of the external cause: Secondary | ICD-10-CM | POA: Insufficient documentation

## 2012-04-06 DIAGNOSIS — S61419A Laceration without foreign body of unspecified hand, initial encounter: Secondary | ICD-10-CM

## 2012-04-06 DIAGNOSIS — Z87891 Personal history of nicotine dependence: Secondary | ICD-10-CM | POA: Insufficient documentation

## 2012-04-06 DIAGNOSIS — S0083XA Contusion of other part of head, initial encounter: Secondary | ICD-10-CM

## 2012-04-06 DIAGNOSIS — Z8546 Personal history of malignant neoplasm of prostate: Secondary | ICD-10-CM | POA: Insufficient documentation

## 2012-04-06 DIAGNOSIS — Z9089 Acquired absence of other organs: Secondary | ICD-10-CM | POA: Insufficient documentation

## 2012-04-06 LAB — URINALYSIS, MICROSCOPIC ONLY
Bilirubin Urine: NEGATIVE
Glucose, UA: NEGATIVE mg/dL
Ketones, ur: NEGATIVE mg/dL
Nitrite: POSITIVE — AB
Protein, ur: 30 mg/dL — AB
Specific Gravity, Urine: 1.018 (ref 1.005–1.030)
Urobilinogen, UA: 0.2 mg/dL (ref 0.0–1.0)
pH: 6 (ref 5.0–8.0)

## 2012-04-06 LAB — BASIC METABOLIC PANEL
BUN: 16 mg/dL (ref 6–23)
CO2: 27 mEq/L (ref 19–32)
Calcium: 9.4 mg/dL (ref 8.4–10.5)
Chloride: 102 mEq/L (ref 96–112)
Creatinine, Ser: 1.05 mg/dL (ref 0.50–1.35)
GFR calc Af Amer: 74 mL/min — ABNORMAL LOW (ref >90)
GFR calc non Af Amer: 64 mL/min — ABNORMAL LOW (ref >90)
Glucose, Bld: 88 mg/dL (ref 70–99)
Potassium: 4.2 mEq/L (ref 3.5–5.1)
Sodium: 137 mEq/L (ref 135–145)

## 2012-04-06 LAB — CBC WITH DIFFERENTIAL/PLATELET
Basophils Absolute: 0 10*3/uL (ref 0.0–0.1)
Basophils Relative: 1 % (ref 0–1)
Eosinophils Absolute: 0.1 10*3/uL (ref 0.0–0.7)
Eosinophils Relative: 3 % (ref 0–5)
HCT: 37.7 % — ABNORMAL LOW (ref 39.0–52.0)
Hemoglobin: 12.4 g/dL — ABNORMAL LOW (ref 13.0–17.0)
Lymphocytes Relative: 24 % (ref 12–46)
Lymphs Abs: 1.3 10*3/uL (ref 0.7–4.0)
MCH: 29.6 pg (ref 26.0–34.0)
MCHC: 32.9 g/dL (ref 30.0–36.0)
MCV: 90 fL (ref 78.0–100.0)
Monocytes Absolute: 0.7 10*3/uL (ref 0.1–1.0)
Monocytes Relative: 13 % — ABNORMAL HIGH (ref 3–12)
Neutro Abs: 3.2 10*3/uL (ref 1.7–7.7)
Neutrophils Relative %: 60 % (ref 43–77)
Platelets: 141 10*3/uL — ABNORMAL LOW (ref 150–400)
RBC: 4.19 MIL/uL — ABNORMAL LOW (ref 4.22–5.81)
RDW: 14.7 % (ref 11.5–15.5)
WBC: 5.3 10*3/uL (ref 4.0–10.5)

## 2012-04-06 LAB — GLUCOSE, CAPILLARY: Glucose-Capillary: 86 mg/dL (ref 70–99)

## 2012-04-06 LAB — TROPONIN I: Troponin I: <0.3 ng/mL (ref <0.30)

## 2012-04-06 MED ORDER — CIPROFLOXACIN HCL 500 MG PO TABS: 500.0000 mg | ORAL_TABLET | Freq: Two times a day (BID) | ORAL | Status: AC

## 2012-04-06 MED ORDER — CIPROFLOXACIN HCL 500 MG PO TABS
500.0000 mg | ORAL_TABLET | Freq: Once | ORAL | Status: AC
  Administered 2012-04-06: 500 mg via ORAL
  Filled 2012-04-06: qty 1

## 2012-04-06 MED ORDER — SODIUM CHLORIDE 0.9 % IV BOLUS (SEPSIS)
500.0000 mL | Freq: Once | INTRAVENOUS | Status: AC
  Administered 2012-04-06: 500 mL via INTRAVENOUS

## 2012-04-06 MED ORDER — HEPARIN SOD (PORK) LOCK FLUSH 100 UNIT/ML IV SOLN
INTRAVENOUS | Status: AC
  Administered 2012-04-06: 500 [IU]
  Filled 2012-04-06: qty 5

## 2012-04-06 NOTE — ED Notes (Signed)
Pt to CT

## 2012-04-06 NOTE — ED Provider Notes (Signed)
History    84yM presenting after fall. Happened shortly before arrival.  Larey Seat in parking lot and struck head against concrete surface. No LOC. Think lost balance. Denies preceding symptoms. Denies HA. Abrasions to face and b/l knees. Small lac to hand. No numbness, tingling or loss of strength. No confusion per family. No acute visual changes but broke glasses. No n/v. Has been ambulatory since and says felt ok on feet.  CSN: [id number]  Arrival date & time 04/06/12  1219   First MD Initiated Contact with Patient 04/06/12 1338      Chief Complaint  Patient presents with  . Fall  . Facial Laceration    (Consider location/radiation/quality/duration/timing/severity/associated sxs/prior treatment) HPI  Past Medical History  Diagnosis Date  . Hypertension   . Chronic kidney disease 06/15/11    bladder cancer  . Blood transfusion   . Arthritis   . Anemia 08/16/2011  . Cancer     bladder cancer, hx prostate cancer    Past Surgical History  Procedure Date  . Colon surgery   . Appendectomy   . Joint replacement   . Vascular surgery     aortic anurysm repair  . Nephrectomy     left kidney removed Sept 2011  . Arcuate keratectomy   . Bladder surgery     Bx of bladder x4  . Prostate biopsy     Prostate seeds/radiation  . Cystoscopy 06/21/2011    Procedure: CYSTOSCOPY;  Surgeon: Crecencio Mc, MD;  Location: [facility];  Service: Urology;  Laterality: N/A;  . Transurethral resection of bladder tumor 06/21/2011    Procedure: TRANSURETHRAL RESECTION OF BLADDER TUMOR (TURBT);  Surgeon: Crecencio Mc, MD;  Location: [facility];  Service: Urology;  Laterality: N/A;  . Transurethral resection of prostate 06/21/2011    Procedure: TRANSURETHRAL RESECTION OF THE PROSTATE (TURP);  Surgeon: Crecencio Mc, MD;  Location: [facility];  Service: Urology;  Laterality: N/A;  . Cystoscopy with urethral dilatation 06/21/2011    Procedure: CYSTOSCOPY WITH URETHRAL DILATATION;  Surgeon: Crecencio Mc, MD;  Location: [facility];   Service: Urology;  Laterality: N/A;    History reviewed. No pertinent family history.  History  Substance Use Topics  . Smoking status: Former Smoker    Types: Cigarettes    Quit date: 07/16/1983  . Smokeless tobacco: Never Used  . Alcohol Use: No      Review of Systems   Review of symptoms negative unless otherwise noted in HPI.   Allergies  Morphine and related and Penicillins  Home Medications   Current Outpatient Rx  Name Route Sig Dispense Refill  . AMLODIPINE BESYLATE 10 MG PO TABS Oral Take 0.5 tablets (5 mg total) by mouth every morning. 30 tablet 0  . FINASTERIDE 5 MG PO TABS Oral Take 5 mg by mouth daily after lunch daily after lunch.      Marland Kitchen SIMVASTATIN 80 MG PO TABS Oral Take 40 mg by mouth at bedtime.     . ACETAMINOPHEN 500 MG PO TABS Oral Take 500-1,000 mg by mouth every 6 (six) hours as needed. pain    . ONDANSETRON HCL 8 MG PO TABS  Take 1 tab two times a day starting the day after chemo for 3 days. Then take 1 tab two times a day as needed for nausea or vomiting. 30 tablet 2    1 every 8 hours  As needed for nausea/vomiting    BP 113/72  Pulse 86  Temp 98.2 F (36.8 C) (Oral)  Resp  18  SpO2 99%  Physical Exam  Nursing note and vitals reviewed. Constitutional: He is oriented to person, place, and time. He appears well-developed and well-nourished. No distress.  HENT:       Hematoma R forehead. Superficial abrasions to forehead and bridge of nose.  Eyes: Conjunctivae and EOM are normal. Pupils are equal, round, and reactive to light. Right eye exhibits no discharge. Left eye exhibits no discharge.  Neck: Neck supple.  Cardiovascular: Normal rate, regular rhythm and normal heart sounds.  Exam reveals no gallop and no friction rub.   No murmur heard. Pulmonary/Chest: Effort normal and breath sounds normal. No respiratory distress.  Abdominal: Soft. He exhibits no distension. There is no tenderness.  Musculoskeletal: He exhibits no edema and no  tenderness.       Small hematoma R knee without bony tenderness. Well healed surgical scars anterior knees. NVI distally. No midline spinal tenderness.  Neurological: He is alert and oriented to person, place, and time. No cranial nerve deficit. He exhibits normal muscle tone. Coordination normal.  Skin: Skin is warm and dry.       Stellate lac palmar aspect L hand near 5th MP joint. Mild oozing. Visualized to base without evidence of FB or tendon damage. visualized through out ROM. neruovasculary intact distally. Flexion intact agaisnt resistance. Mild bony tenderness at site fo lac.  Psychiatric: He has a normal mood and affect. His behavior is normal. Thought content normal.    ED Course  Procedures (including critical care time)  LACERATION REPAIR Performed by: Raeford Razor Authorized by: Raeford Razor Consent: Verbal consent obtained. Risks and benefits: risks, benefits and alternatives were discussed Consent given by: patient Patient identity confirmed: provided demographic data Prepped and Draped in normal sterile fashion Wound explored  Laceration Location: L hand   Laceration Length: 2 cm  No Foreign Bodies seen or palpated  Anesthesia: local infiltration  Local anesthetic: lidocaine 2% w epinephrine  Anesthetic total 2 ml  Irrigation method: syringe Amount of cleaning: standard  Skin closure: single layer  Number of sutures: 5   Technique: simple interuppted w 4-0 prolene  Patient tolerance: Patient tolerated the procedure well with no immediate complications.   Labs Reviewed  CBC WITH DIFFERENTIAL - Abnormal; Notable for the following:    RBC 4.19 (*)     Hemoglobin 12.4 (*)     HCT 37.7 (*)     Platelets 141 (*)     Monocytes Relative 13 (*)     All other components within normal limits  BASIC METABOLIC PANEL - Abnormal; Notable for the following:    GFR calc non Af Amer 64 (*)     GFR calc Af Amer 74 (*)     All other components within normal  limits  URINALYSIS, WITH MICROSCOPIC - Abnormal; Notable for the following:    APPearance CLOUDY (*)     Hgb urine dipstick SMALL (*)     Protein, ur 30 (*)     Nitrite POSITIVE (*)     Leukocytes, UA LARGE (*)     Bacteria, UA MANY (*)     All other components within normal limits  GLUCOSE, CAPILLARY  TROPONIN I  URINE CULTURE   Ct Head Wo Contrast  04/06/2012  *RADIOLOGY REPORT*  Clinical Data:   Status post fall with facial lacerations.  CT HEAD WITHOUT CONTRAST CT MAXILLOFACIAL WITHOUT CONTRAST CT CERVICAL SPINE WITHOUT CONTRAST  Technique:  Multidetector CT imaging of the head, cervical spine, and maxillofacial structures  were performed using the standard protocol without intravenous contrast. Multiplanar CT image reconstructions of the cervical spine and maxillofacial structures were also generated.  Comparison:  None  CT HEAD  Findings: There is diffuse patchy low density throughout the subcortical and periventricular white matter consistent with chronic small vessel ischemic change.  There is prominence of the sulci and ventricles consistent with brain atrophy.  There is no evidence for acute brain infarct, hemorrhage or mass. Left frontal scalp hematoma measures 7 mm in thickness.  The underlying calvarium appears intact.  The mastoid air cells and paranasal sinuses appear clear.  IMPRESSION:  1.  No acute intracranial abnormalities. 2.  Small vessel ischemic change and brain atrophy. 3.  Left frontal scalp hematoma.  CT MAXILLOFACIAL  Findings:  Left frontal scalp in the left preseptal hematoma is identified.  This measures approximately 1.3 cm in thickness. The facial bones appear intact.  No evidence for orbital blowout fracture.  There is leftward deviation and spurring of the nasal septum.  Mild mucosal thickening involves the frontal sinus.  IMPRESSION:  1.  Left frontal scalp and left preseptal hematoma. 2.  Mild chronic appearing mucosal thickening involving the frontal sinus.  CT  CERVICAL SPINE  Findings:   Normal alignment of the cervical spine.  The vertebral body heights are well preserved.  The facet joints are all aligned. There is multilevel bilateral facet degenerative change and hypertrophy.  Disc space narrowing and ventral spurring is noted at C5-6, C6-7 and C 71.  No acute fractures or subluxations identified.  Limited imaging through the lung apices is unremarkable.  IMPRESSION:  1.  Cervical spondylosis. 2.  No acute fracture or subluxation noted.   Original Report Authenticated By: Rosealee Albee, M.D.    Ct Cervical Spine Wo Contrast  04/06/2012  *RADIOLOGY REPORT*  Clinical Data:   Status post fall with facial lacerations.  CT HEAD WITHOUT CONTRAST CT MAXILLOFACIAL WITHOUT CONTRAST CT CERVICAL SPINE WITHOUT CONTRAST  Technique:  Multidetector CT imaging of the head, cervical spine, and maxillofacial structures were performed using the standard protocol without intravenous contrast. Multiplanar CT image reconstructions of the cervical spine and maxillofacial structures were also generated.  Comparison:  None  CT HEAD  Findings: There is diffuse patchy low density throughout the subcortical and periventricular white matter consistent with chronic small vessel ischemic change.  There is prominence of the sulci and ventricles consistent with brain atrophy.  There is no evidence for acute brain infarct, hemorrhage or mass. Left frontal scalp hematoma measures 7 mm in thickness.  The underlying calvarium appears intact.  The mastoid air cells and paranasal sinuses appear clear.  IMPRESSION:  1.  No acute intracranial abnormalities. 2.  Small vessel ischemic change and brain atrophy. 3.  Left frontal scalp hematoma.  CT MAXILLOFACIAL  Findings:  Left frontal scalp in the left preseptal hematoma is identified.  This measures approximately 1.3 cm in thickness. The facial bones appear intact.  No evidence for orbital blowout fracture.  There is leftward deviation and spurring of  the nasal septum.  Mild mucosal thickening involves the frontal sinus.  IMPRESSION:  1.  Left frontal scalp and left preseptal hematoma. 2.  Mild chronic appearing mucosal thickening involving the frontal sinus.  CT CERVICAL SPINE  Findings:   Normal alignment of the cervical spine.  The vertebral body heights are well preserved.  The facet joints are all aligned. There is multilevel bilateral facet degenerative change and hypertrophy.  Disc space narrowing and ventral  spurring is noted at C5-6, C6-7 and C 71.  No acute fractures or subluxations identified.  Limited imaging through the lung apices is unremarkable.  IMPRESSION:  1.  Cervical spondylosis. 2.  No acute fracture or subluxation noted.   Original Report Authenticated By: Rosealee Albee, M.D.    Dg Hand Complete Left  04/06/2012  *RADIOLOGY REPORT*  Clinical Data: Fall  LEFT HAND - COMPLETE 3+ VIEW  Comparison: None.  Findings: Within the metacarpal bones and phalanges, there is no acute fracture and no dislocation.  Prominent degenerative changes of the IP joints as well as the base of the first metacarpal are noted.  Bones are mildly osteopenic.  No destructive bone lesion. Unremarkable soft tissues.  Within the wrist, there is an abnormal orientation of the scaphoid, lunate, and capitate.  Subluxation, dislocation, or instability pattern cannot be excluded.  Wrist study is recommended.  IMPRESSION: Within the hand, there is no acute fracture and no dislocation. Degenerative changes are noted.  Within the wrist, there is abnormal orientation of the scaphoid, lunate, and capitate.   Injury or instability pattern cannot be excluded.  Wrist study is recommended.   Original Report Authenticated By: Donavan Burnet, M.D.    Ct Maxillofacial Wo Cm  04/06/2012  *RADIOLOGY REPORT*  Clinical Data:   Status post fall with facial lacerations.  CT HEAD WITHOUT CONTRAST CT MAXILLOFACIAL WITHOUT CONTRAST CT CERVICAL SPINE WITHOUT CONTRAST  Technique:   Multidetector CT imaging of the head, cervical spine, and maxillofacial structures were performed using the standard protocol without intravenous contrast. Multiplanar CT image reconstructions of the cervical spine and maxillofacial structures were also generated.  Comparison:  None  CT HEAD  Findings: There is diffuse patchy low density throughout the subcortical and periventricular white matter consistent with chronic small vessel ischemic change.  There is prominence of the sulci and ventricles consistent with brain atrophy.  There is no evidence for acute brain infarct, hemorrhage or mass. Left frontal scalp hematoma measures 7 mm in thickness.  The underlying calvarium appears intact.  The mastoid air cells and paranasal sinuses appear clear.  IMPRESSION:  1.  No acute intracranial abnormalities. 2.  Small vessel ischemic change and brain atrophy. 3.  Left frontal scalp hematoma.  CT MAXILLOFACIAL  Findings:  Left frontal scalp in the left preseptal hematoma is identified.  This measures approximately 1.3 cm in thickness. The facial bones appear intact.  No evidence for orbital blowout fracture.  There is leftward deviation and spurring of the nasal septum.  Mild mucosal thickening involves the frontal sinus.  IMPRESSION:  1.  Left frontal scalp and left preseptal hematoma. 2.  Mild chronic appearing mucosal thickening involving the frontal sinus.  CT CERVICAL SPINE  Findings:   Normal alignment of the cervical spine.  The vertebral body heights are well preserved.  The facet joints are all aligned. There is multilevel bilateral facet degenerative change and hypertrophy.  Disc space narrowing and ventral spurring is noted at C5-6, C6-7 and C 71.  No acute fractures or subluxations identified.  Limited imaging through the lung apices is unremarkable.  IMPRESSION:  1.  Cervical spondylosis. 2.  No acute fracture or subluxation noted.   Original Report Authenticated By: Rosealee Albee, M.D.     EKG:  Rhythm:  nsr Rate: 71 Axis: Left Intervals: 1st degree AV block. Abnormal R wave progression. ST segments: NS ST changes  1. Closed head injury   2. Facial contusion   3. Hand laceration  4. Knee contusion   5. UTI (urinary tract infection)   6. Urinary catheter (Foley) change required       MDM  84yM with closed head injury after fall. Pt says that he stumbled but not completely sure. Because of this EKG and labs drawn which unremarkable. Imaging negative for acute injury. Abnormal alignment of carpal bones does not clinically correlate to acute injury. Stellate hand lac closed.  Tetanus current. UA consistent with UTI but has chronic foley and likely chronically colonized. Given hx of fall today though will tx. Catheter changed. Pt was scheduled to have this done at urology appointment anyways.         Raeford Razor, MD 04/07/12 1052

## 2012-04-06 NOTE — ED Notes (Signed)
To ED by private vehicle, stumbled in parking lot. Pt denies loss of consciousness. Pt has hematoma with laceration over left eye. Small laceration to the bridge of nose. Abrasion on chin. Hematoma on Left cheek bone. Small laceration on left hand at the base of 'pinkie' finger. Pt is alert and oriented x4.

## 2012-04-06 NOTE — ED Notes (Signed)
md at bedside  Pt alert and oriented x4. Respirations even and unlabored, bilateral symmetrical rise and fall of chest. Skin warm and dry. In no acute distress. Denies needs.   

## 2012-04-10 ENCOUNTER — Telehealth: Payer: Self-pay | Admitting: *Deleted

## 2012-04-10 LAB — URINE CULTURE: Colony Count: >=100000

## 2012-04-10 NOTE — Telephone Encounter (Signed)
Patient called and moved his flush appt out to a new date. JMW

## 2012-04-11 NOTE — ED Notes (Signed)
+   Urine Patient treated with Cipro-resistant to same-chart sent to EDP office for review.

## 2012-04-13 ENCOUNTER — Emergency Department (INDEPENDENT_AMBULATORY_CARE_PROVIDER_SITE_OTHER)
Admission: EM | Admit: 2012-04-13 | Discharge: 2012-04-13 | Disposition: A | Discharge status: Home / Self Care | Payer: Medicare Other | Source: Home / Self Care | Attending: Emergency Medicine | Admitting: Emergency Medicine

## 2012-04-13 ENCOUNTER — Encounter (HOSPITAL_COMMUNITY): Payer: Self-pay

## 2012-04-13 DIAGNOSIS — IMO0002 Reserved for concepts with insufficient information to code with codable children: Secondary | ICD-10-CM

## 2012-04-13 DIAGNOSIS — Z4802 Encounter for removal of sutures: Secondary | ICD-10-CM

## 2012-04-13 NOTE — ED Provider Notes (Signed)
History     CSN: [id number]  Arrival date & time 04/13/12  1452   First MD Initiated Contact with Patient 04/13/12 1525      Chief Complaint  Patient presents with  . Suture / Staple Removal    (Consider location/radiation/quality/duration/timing/severity/associated sxs/prior treatment) Patient is a 78 y.o. male presenting with suture removal. The history is provided by the patient.  Suture / Staple Removal  The sutures were placed 7 to 10 days ago. Treatments since wound repair include antibiotic ointment use and regular soap and water washings. Fever duration: no fever. There has been no drainage from the wound. The redness has improved. The swelling has improved. The pain has no pain. He has no difficulty moving the affected extremity or digit.    Past Medical History  Diagnosis Date  . Hypertension   . Chronic kidney disease 06/15/11    bladder cancer  . Blood transfusion   . Arthritis   . Anemia 08/16/2011  . Cancer     bladder cancer, hx prostate cancer    Past Surgical History  Procedure Date  . Colon surgery   . Appendectomy   . Joint replacement   . Vascular surgery     aortic anurysm repair  . Nephrectomy     left kidney removed Sept 2011  . Arcuate keratectomy   . Bladder surgery     Bx of bladder x4  . Prostate biopsy     Prostate seeds/radiation  . Cystoscopy 06/21/2011    Procedure: CYSTOSCOPY;  Surgeon: Crecencio Mc, MD;  Location: [facility];  Service: Urology;  Laterality: N/A;  . Transurethral resection of bladder tumor 06/21/2011    Procedure: TRANSURETHRAL RESECTION OF BLADDER TUMOR (TURBT);  Surgeon: Crecencio Mc, MD;  Location: [facility];  Service: Urology;  Laterality: N/A;  . Transurethral resection of prostate 06/21/2011    Procedure: TRANSURETHRAL RESECTION OF THE PROSTATE (TURP);  Surgeon: Crecencio Mc, MD;  Location: [facility];  Service: Urology;  Laterality: N/A;  . Cystoscopy with urethral dilatation 06/21/2011    Procedure: CYSTOSCOPY WITH URETHRAL  DILATATION;  Surgeon: Crecencio Mc, MD;  Location: [facility];  Service: Urology;  Laterality: N/A;    No family history on file.  History  Substance Use Topics  . Smoking status: Former Smoker    Types: Cigarettes    Quit date: 07/16/1983  . Smokeless tobacco: Never Used  . Alcohol Use: No      Review of Systems  All other systems reviewed and are negative.    Allergies  Morphine and related and Penicillins  Home Medications   Current Outpatient Rx  Name Route Sig Dispense Refill  . AMLODIPINE BESYLATE 10 MG PO TABS Oral Take 0.5 tablets (5 mg total) by mouth every morning. 30 tablet 0  . CIPROFLOXACIN HCL 500 MG PO TABS Oral Take 1 tablet (500 mg total) by mouth 2 (two) times daily. 14 tablet 0  . FINASTERIDE 5 MG PO TABS Oral Take 5 mg by mouth daily after lunch daily after lunch.      . ONDANSETRON HCL 8 MG PO TABS  Take 1 tab two times a day starting the day after chemo for 3 days. Then take 1 tab two times a day as needed for nausea or vomiting. 30 tablet 2    1 every 8 hours  As needed for nausea/vomiting  . SIMVASTATIN 80 MG PO TABS Oral Take 40 mg by mouth at bedtime.     . ACETAMINOPHEN 500 MG PO  TABS Oral Take 500-1,000 mg by mouth every 6 (six) hours as needed. pain      BP 115/71  Pulse 96  Temp 98.1 F (36.7 C) (Oral)  Resp 18  SpO2 96%  Physical Exam  Nursing note and vitals reviewed. Constitutional: He is oriented to person, place, and time. Vital signs are normal. He appears well-developed and well-nourished. He is active and cooperative.  HENT:  Head: Normocephalic.  Eyes: Conjunctivae are normal. Pupils are equal, round, and reactive to light. No scleral icterus.  Neck: Trachea normal. Neck supple.  Cardiovascular: Normal rate and regular rhythm.   Pulmonary/Chest: Effort normal and breath sounds normal.  Musculoskeletal:       Hands:      Scabbed area, no drainage, redness or signs of infection.  No disability in digits, distal sensation intact.    Neurological: He is alert and oriented to person, place, and time. No cranial nerve deficit or sensory deficit.  Skin: Skin is warm and dry.  Psychiatric: He has a normal mood and affect. His speech is normal and behavior is normal. Judgment and thought content normal. Cognition and memory are normal.    ED Course  SUTURE REMOVAL Date/Time: 04/13/2012 3:58 PM Performed by: Johnsie Kindred Authorized by: Luiz Blare Consent: Verbal consent obtained. Risks and benefits: risks, benefits and alternatives were discussed Consent given by: patient Patient understanding: patient states understanding of the procedure being performed Patient consent: the patient's understanding of the procedure matches consent given Patient identity confirmed: verbally with patient and arm band Time out: Immediately prior to procedure a "time out" was called to verify the correct patient, procedure, equipment, support staff and site/side marked as required. Body area: upper extremity Location details: left hand Wound Appearance: clean (scabbed) Sutures Removed: 5 Post-removal: dressing applied and antibiotic ointment applied Sutures placed in this facility: sutures placed at [facility]. Patient tolerance: Patient tolerated the procedure well with no immediate complications.   (including critical care time)  Labs Reviewed - No data to display No results found.   1. Dressing change/suture removal       MDM  Keep wound clean and dry until healed completely, may use antibiotic ointment to area.  Return as needed.          Johnsie Kindred, NP 04/13/12 1601

## 2012-04-13 NOTE — ED Notes (Signed)
Call pt - d/c Cipro. Call in rx for Bactrim DS BID x 7 days . Return to ED for fever,flank pain,n/v per [facility].

## 2012-04-13 NOTE — ED Notes (Signed)
Pt states seen at [facility] ED 1 week ago for placement of suture to palm of lt hand to close laceration following a fall.  Here today for suture removal.  Denies concerns with wound. Tetanus within one year.

## 2012-04-15 NOTE — ED Provider Notes (Signed)
Medical screening examination/treatment/procedure(s) were performed by non-physician practitioner and as supervising physician I was immediately available for consultation/collaboration.  Luiz Blare MD   Luiz Blare, MD 04/15/12 [phone]

## 2012-04-23 NOTE — ED Notes (Signed)
Rx called to [facility] by Steward Ros PFM.

## 2012-04-23 NOTE — ED Notes (Signed)
Called patient and informed them of +Urine and new Rx. Wants Rx called to [facility] on [address].

## 2012-05-18 ENCOUNTER — Ambulatory Visit (HOSPITAL_BASED_OUTPATIENT_CLINIC_OR_DEPARTMENT_OTHER): Payer: Medicare Other

## 2012-05-18 VITALS — BP 137/77 | HR 86 | Temp 98.3°F

## 2012-05-18 DIAGNOSIS — Z452 Encounter for adjustment and management of vascular access device: Secondary | ICD-10-CM

## 2012-05-18 DIAGNOSIS — C659 Malignant neoplasm of unspecified renal pelvis: Secondary | ICD-10-CM

## 2012-05-18 DIAGNOSIS — C649 Malignant neoplasm of unspecified kidney, except renal pelvis: Secondary | ICD-10-CM

## 2012-05-18 MED ORDER — SODIUM CHLORIDE 0.9 % IJ SOLN
10.0000 mL | INTRAMUSCULAR | Status: DC | PRN
  Administered 2012-05-18: 10 mL via INTRAVENOUS
  Filled 2012-05-18: qty 10

## 2012-05-18 MED ORDER — HEPARIN SOD (PORK) LOCK FLUSH 100 UNIT/ML IV SOLN
500.0000 [IU] | Freq: Once | INTRAVENOUS | Status: AC
  Administered 2012-05-18: 500 [IU] via INTRAVENOUS
  Filled 2012-05-18: qty 5

## 2012-06-15 ENCOUNTER — Ambulatory Visit (HOSPITAL_BASED_OUTPATIENT_CLINIC_OR_DEPARTMENT_OTHER): Payer: Medicare Other

## 2012-06-15 ENCOUNTER — Ambulatory Visit (HOSPITAL_COMMUNITY)
Admission: RE | Admit: 2012-06-15 | Discharge: 2012-06-15 | Disposition: A | Discharge status: Home / Self Care | Payer: Medicare Other | Source: Ambulatory Visit | Attending: Oncology | Admitting: Oncology

## 2012-06-15 ENCOUNTER — Other Ambulatory Visit (HOSPITAL_BASED_OUTPATIENT_CLINIC_OR_DEPARTMENT_OTHER): Payer: Medicare Other | Admitting: Lab

## 2012-06-15 VITALS — BP 124/84 | HR 83 | Temp 97.8°F

## 2012-06-15 DIAGNOSIS — Z9221 Personal history of antineoplastic chemotherapy: Secondary | ICD-10-CM | POA: Insufficient documentation

## 2012-06-15 DIAGNOSIS — C659 Malignant neoplasm of unspecified renal pelvis: Secondary | ICD-10-CM

## 2012-06-15 DIAGNOSIS — C649 Malignant neoplasm of unspecified kidney, except renal pelvis: Secondary | ICD-10-CM

## 2012-06-15 DIAGNOSIS — R0602 Shortness of breath: Secondary | ICD-10-CM | POA: Insufficient documentation

## 2012-06-15 DIAGNOSIS — Z8546 Personal history of malignant neoplasm of prostate: Secondary | ICD-10-CM | POA: Insufficient documentation

## 2012-06-15 DIAGNOSIS — K449 Diaphragmatic hernia without obstruction or gangrene: Secondary | ICD-10-CM | POA: Insufficient documentation

## 2012-06-15 DIAGNOSIS — C7800 Secondary malignant neoplasm of unspecified lung: Secondary | ICD-10-CM

## 2012-06-15 DIAGNOSIS — K5730 Diverticulosis of large intestine without perforation or abscess without bleeding: Secondary | ICD-10-CM | POA: Insufficient documentation

## 2012-06-15 DIAGNOSIS — Z452 Encounter for adjustment and management of vascular access device: Secondary | ICD-10-CM

## 2012-06-15 DIAGNOSIS — K8020 Calculus of gallbladder without cholecystitis without obstruction: Secondary | ICD-10-CM | POA: Insufficient documentation

## 2012-06-15 LAB — COMPREHENSIVE METABOLIC PANEL (CC13)
ALT: 13 U/L (ref 0–55)
CO2: 28 mEq/L (ref 22–29)
Creatinine: 1.3 mg/dL (ref 0.7–1.3)
Total Bilirubin: 0.39 mg/dL (ref 0.20–1.20)

## 2012-06-15 LAB — CBC WITH DIFFERENTIAL/PLATELET
BASO%: 0.8 % (ref 0.0–2.0)
EOS%: 3.4 % (ref 0.0–7.0)
HCT: 39.4 % (ref 38.4–49.9)
LYMPH%: 32.9 % (ref 14.0–49.0)
MCH: 31.1 pg (ref 27.2–33.4)
MCHC: 33.6 g/dL (ref 32.0–36.0)
MCV: 92.4 fL (ref 79.3–98.0)
MONO#: 0.7 10*3/uL (ref 0.1–0.9)
NEUT%: 50.5 % (ref 39.0–75.0)
Platelets: 157 10*3/uL (ref 140–400)

## 2012-06-15 MED ORDER — SODIUM CHLORIDE 0.9 % IJ SOLN
10.0000 mL | INTRAMUSCULAR | Status: DC | PRN
  Administered 2012-06-15: 10 mL via INTRAVENOUS
  Filled 2012-06-15: qty 10

## 2012-06-15 MED ORDER — HEPARIN SOD (PORK) LOCK FLUSH 100 UNIT/ML IV SOLN
500.0000 [IU] | Freq: Once | INTRAVENOUS | Status: AC
  Administered 2012-06-15: 500 [IU] via INTRAVENOUS
  Filled 2012-06-15: qty 5

## 2012-06-15 NOTE — Progress Notes (Signed)
Patient accessed with Power-loc.  Flushed, left accessed for CT scan today

## 2012-06-15 NOTE — Patient Instructions (Signed)
Call MD for problems.  Patient sent to CT

## 2012-06-16 ENCOUNTER — Ambulatory Visit (HOSPITAL_BASED_OUTPATIENT_CLINIC_OR_DEPARTMENT_OTHER): Payer: Medicare Other | Admitting: Oncology

## 2012-06-16 ENCOUNTER — Telehealth: Payer: Self-pay | Admitting: Oncology

## 2012-06-16 VITALS — BP 134/91 | HR 54 | Temp 97.0°F | Resp 20 | Ht 69.0 in | Wt 226.0 lb

## 2012-06-16 DIAGNOSIS — C675 Malignant neoplasm of bladder neck: Secondary | ICD-10-CM

## 2012-06-16 DIAGNOSIS — Z8546 Personal history of malignant neoplasm of prostate: Secondary | ICD-10-CM

## 2012-06-16 DIAGNOSIS — C7800 Secondary malignant neoplasm of unspecified lung: Secondary | ICD-10-CM

## 2012-06-16 NOTE — Progress Notes (Signed)
Hematology and Oncology Follow Up Visit  [name] [id number] Apr 08, 1927 78 y.o. 06/16/2012 1:48 PM   CC: Brian Purpura, MD  Brian Kindle, MD   Principle Diagnosis: This is a pleasant 78 year old gentleman with the following issues:  1. 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 Now has metastatic disease with lung nodules. 2. Prostate cancer: He is s/p treatment with brachytherapy in December 2002. He has had no evidence for cancer recurrence since treatment.   Prior Therapy:  1. He underwent an open left nephroureterectomy done on June 04, 2010. His pathology from that showed [phone], showed that he had a papillary invasive high-grade urothelial carcinoma with negative urinary bladder cuff margins. The tumor was 3.8 cm. No lymphovascular invasion. Again, was a T2 NX disease.  S/P the six cycle of  chemotherapy of Carboplatin and Gemzar. Day 8 cycle 6 was on 4/18.  2. He is S/P the six cycle of  chemotherapy of Carboplatin and Gemzar. Completed on 12/02/2011.   Current therapy:  Observation and follow up.   Interim History: [name] presents for a follow up visit. He completed the sixth cycle of chemotherapy in 11/2011 and  have recovered well at this time. He is really asymptomatic at this time. He is no longer reporting any fever. He did not report any abdominal pain. He had not reported any chest pain. Had not reported any hematuria. Had not reported any flank pain. He had not reported really any major changes in his performance status or activity level at this time. He has continued to perform activities of daily living without any hindrance or decline. He has continued to drive and attends to again activities of daily living without problems at this point. He still has a foley catheter in place.  Medications: I have reviewed the patient's current medications. Current outpatient  prescriptions:acetaminophen (TYLENOL) 500 MG tablet, Take 500-1,000 mg by mouth every 6 (six) hours as needed. pain, Disp: , Rfl: ;  amLODipine (NORVASC) 10 MG tablet, Take 0.5 tablets (5 mg total) by mouth every morning., Disp: 30 tablet, Rfl: 0;  finasteride (PROSCAR) 5 MG tablet, Take 5 mg by mouth daily after lunch daily after lunch.  , Disp: , Rfl:  ondansetron (ZOFRAN) 8 MG tablet, Take 1 tab two times a day starting the day after chemo for 3 days. Then take 1 tab two times a day as needed for nausea or vomiting., Disp: 30 tablet, Rfl: 2;  simvastatin (ZOCOR) 80 MG tablet, Take 40 mg by mouth at bedtime. , Disp: , Rfl:  No current facility-administered medications for this visit. Facility-Administered Medications Ordered in Other Visits: heparin lock flush 100 unit/mL, 500 Units, Intravenous, Once, Brian Core, MD, 500 Units at 06/15/12 1621;  DISCONTD: sodium chloride 0.9 % injection 10 mL, 10 mL, Intravenous, PRN, Brian Core, MD, 10 mL at 06/15/12 1621  Allergies:  Allergies  Allergen Reactions  . Morphine And Related Anxiety  . Penicillins Rash    Past Medical History, Surgical history, Social history, and Family History were reviewed and updated.  Review of Systems: Constitutional:  Negative for fever, chills, night sweats, anorexia, weight loss, pain. Cardiovascular: no chest pain or dyspnea on exertion Respiratory: no cough, shortness of breath, or wheezing Neurological: no TIA or stroke symptoms Dermatological: negative ENT: negative Skin: Negative. Gastrointestinal: no abdominal pain, change in bowel habits, or black or bloody stools Genito-Urinary: no dysuria, trouble voiding,  or hematuria Hematological and Lymphatic: negative Breast: negative Musculoskeletal: negative Remaining ROS negative. Physical Exam: Blood pressure 134/91, pulse 54, temperature 97 F (36.1 C), temperature source Oral, resp. rate 20, height 5\' 9"  (1.753 m), weight 226 lb (102.513 kg). ECOG:  1 General appearance: alert Head: Normocephalic, without obvious abnormality, atraumatic Neck: no adenopathy, no carotid bruit, no JVD, supple, symmetrical, trachea midline and thyroid not enlarged, symmetric, no tenderness/mass/nodules Lymph nodes: Cervical, supraclavicular, and axillary nodes normal. Heart:regular rate and rhythm, S1, S2 normal, no murmur, click, rub or gallop Lung:chest clear, no wheezing, rales, normal symmetric air entry Abdomin: soft, non-tender, without masses or organomegaly EXT:no erythema, induration, or nodules   Lab Results: Lab Results  Component Value Date   WBC 5.3 06/15/2012   HGB 13.3 06/15/2012   HCT 39.4 06/15/2012   MCV 92.4 06/15/2012   PLT 157 06/15/2012     Chemistry      Component Value Date/Time   NA 139 06/15/2012 1526   NA 137 04/06/2012 1445   K 4.1 06/15/2012 1526   K 4.2 04/06/2012 1445   CL 104 06/15/2012 1526   CL 102 04/06/2012 1445   CO2 28 06/15/2012 1526   CO2 27 04/06/2012 1445   BUN 20.0 06/15/2012 1526   BUN 16 04/06/2012 1445   CREATININE 1.3 06/15/2012 1526   CREATININE 1.05 04/06/2012 1445      Component Value Date/Time   CALCIUM 9.4 06/15/2012 1526   CALCIUM 9.4 04/06/2012 1445   ALKPHOS 60 06/15/2012 1526   ALKPHOS 52 03/14/2012 1431   AST 19 06/15/2012 1526   AST 19 03/14/2012 1431   ALT 13 06/15/2012 1526   ALT 14 03/14/2012 1431   BILITOT 0.39 06/15/2012 1526   BILITOT 0.4 03/14/2012 1431     CT scan on 06/15/2012.  CT CHEST, ABDOMEN AND PELVIS WITH CONTRAST  Technique: Multidetector CT imaging of the chest, abdomen and  pelvis was performed following the standard protocol during bolus  administration of intravenous contrast.  Contrast: 100 ml Omnipaque-300 and oral contrast  Comparison: 12/09/2011  CT CHEST  Findings: A tiny sub-centimeter ill-defined nodular density is  again seen in the superior segment left lower lobe on image 18  which is stable. No other pulmonary nodules or masses are  identified.  No evidence of pulmonary infiltrate or central  endobronchial lesion.  No evidence of pleural or pericardial effusion. No evidence of  mediastinal or hilar soft tissue masses. No adenopathy seen  elsewhere within the thorax. No suspicious bone lesions are  identified.  IMPRESSION:  1. Stable tiny nodular opacity at site of treated metastasis in  the superior segment of the left lower lobe.  2. No new or progressive disease within the thorax.  CT ABDOMEN AND PELVIS  Findings: Postop changes from left nephrectomy are stable, as well  as those from previous abdominal aortic aneurysm repair. No soft  tissue masses are identified in the left nephrectomy bed. A tiny  sub-centimeter cyst is again seen in the anterior lower pole of the  right kidney which is stable. No right renal mass or  hydronephrosis identified. No evidence of retroperitoneal  lymphadenopathy.  A small hiatal hernia is again noted. The other abdominal  parenchymal organs are normal in appearance. Tiny gallstones are  again seen, without evidence of cholecystitis.  No pelvic masses or lymphadenopathy are identified. Brachytherapy  seeds again seen in the region of the prostate gland, and Foley  catheter is seen within the bladder which is  empty and difficult to  evaluate.  Right hip prosthesis results in beam hardening artifact in the  inferior pelvis. Severe diverticulosis is seen involving the  descending sigmoid colon, however there is no evidence of  diverticulitis. No other inflammatory process or abnormal fluid  collections are seen. No evidence of dilated bowel loops or  hernia. 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 No suspicious bone lesions are identified.  IMPRESSION:  1. No evidence of recurrent or metastatic carcinoma within the  abdomen or pelvis.  2. Stable cholelithiasis and small hiatal hernia.  3. Stable severe diverticulosis. No radiographic evidence of    diverticulitis.   Impression and Plan:  This is a pleasant 78 year old gentleman with the following issues:   1. 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 He had a documented invasive tumor of the left renal pelvis, status post left nephroureterectomy, now with recurrence into the bladder neck area, biopsy proven to be muscle invasive. He has also had imaging studies that showed possible lymphadenopathy, as well as lung nodules. PET scan showed positive FDG uptake for malignancy. This is likely represent stage IV disease.  After Six cycles of therapy, his tumor showed a complete response to chemotherapy. CT scan reviewed from 10/31 and continue to show no evidence of disease.  For now, the plan is active surveillance with repeat imaging studies in 6 months.  He is following with Dr. Laverle Patter for evaluation an evaluation.   2. History of prostate cancer: not active at this point.  3. Follow up: in 3 months, with port flush every 6 weeks.   4. Anemia: This is related to chemotherapy and cancer. This has resolved now.      Kaveon Blatz, MD 11/1/20131:48 PM

## 2012-06-16 NOTE — Telephone Encounter (Signed)
gv and printed appt schedule to pt for Dec and Jan  °

## 2012-07-27 ENCOUNTER — Ambulatory Visit (HOSPITAL_BASED_OUTPATIENT_CLINIC_OR_DEPARTMENT_OTHER): Payer: Medicare Other

## 2012-07-27 VITALS — BP 132/56 | HR 64 | Temp 98.3°F

## 2012-07-27 DIAGNOSIS — C675 Malignant neoplasm of bladder neck: Secondary | ICD-10-CM

## 2012-07-27 DIAGNOSIS — Z452 Encounter for adjustment and management of vascular access device: Secondary | ICD-10-CM

## 2012-07-27 DIAGNOSIS — C7800 Secondary malignant neoplasm of unspecified lung: Secondary | ICD-10-CM

## 2012-07-27 DIAGNOSIS — C3490 Malignant neoplasm of unspecified part of unspecified bronchus or lung: Secondary | ICD-10-CM

## 2012-07-27 MED ORDER — HEPARIN SOD (PORK) LOCK FLUSH 100 UNIT/ML IV SOLN
500.0000 [IU] | Freq: Once | INTRAVENOUS | Status: AC
  Administered 2012-07-27: 500 [IU] via INTRAVENOUS
  Filled 2012-07-27: qty 5

## 2012-07-27 MED ORDER — SODIUM CHLORIDE 0.9 % IJ SOLN
10.0000 mL | INTRAMUSCULAR | Status: DC | PRN
  Administered 2012-07-27: 10 mL via INTRAVENOUS
  Filled 2012-07-27: qty 10

## 2012-07-27 NOTE — Patient Instructions (Signed)
Call MD for problems 

## 2012-08-11 ENCOUNTER — Telehealth: Payer: Self-pay | Admitting: Oncology

## 2012-08-11 NOTE — Telephone Encounter (Signed)
s.w. pt and r/s 1.22.14 to 1.31.14...Marland Kitchenpt ok and aware

## 2012-09-06 ENCOUNTER — Other Ambulatory Visit: Payer: Medicare Other | Admitting: Lab

## 2012-09-06 ENCOUNTER — Ambulatory Visit: Payer: Medicare Other | Admitting: Oncology

## 2012-09-06 ENCOUNTER — Telehealth: Payer: Self-pay | Admitting: Oncology

## 2012-09-06 NOTE — Telephone Encounter (Signed)
s.w. pt and advised on Feb 2.11.14 appt and that the flush is still 1.31.14...Marland Kitchenpt ok and aware

## 2012-09-15 ENCOUNTER — Ambulatory Visit (HOSPITAL_BASED_OUTPATIENT_CLINIC_OR_DEPARTMENT_OTHER): Payer: Medicare Other

## 2012-09-15 ENCOUNTER — Ambulatory Visit: Payer: Medicare Other | Admitting: Oncology

## 2012-09-15 ENCOUNTER — Other Ambulatory Visit: Payer: Medicare Other | Admitting: Lab

## 2012-09-15 VITALS — BP 147/61 | HR 84 | Temp 97.8°F

## 2012-09-15 DIAGNOSIS — C7800 Secondary malignant neoplasm of unspecified lung: Secondary | ICD-10-CM

## 2012-09-15 DIAGNOSIS — Z452 Encounter for adjustment and management of vascular access device: Secondary | ICD-10-CM

## 2012-09-15 DIAGNOSIS — C3490 Malignant neoplasm of unspecified part of unspecified bronchus or lung: Secondary | ICD-10-CM

## 2012-09-15 DIAGNOSIS — C675 Malignant neoplasm of bladder neck: Secondary | ICD-10-CM

## 2012-09-15 MED ORDER — HEPARIN SOD (PORK) LOCK FLUSH 100 UNIT/ML IV SOLN
500.0000 [IU] | Freq: Once | INTRAVENOUS | Status: AC
  Administered 2012-09-15: 500 [IU] via INTRAVENOUS
  Filled 2012-09-15: qty 5

## 2012-09-15 MED ORDER — SODIUM CHLORIDE 0.9 % IJ SOLN
10.0000 mL | INTRAMUSCULAR | Status: DC | PRN
  Administered 2012-09-15: 10 mL via INTRAVENOUS
  Filled 2012-09-15: qty 10

## 2012-09-26 ENCOUNTER — Other Ambulatory Visit (HOSPITAL_BASED_OUTPATIENT_CLINIC_OR_DEPARTMENT_OTHER): Payer: Medicare Other

## 2012-09-26 ENCOUNTER — Ambulatory Visit (HOSPITAL_BASED_OUTPATIENT_CLINIC_OR_DEPARTMENT_OTHER): Payer: Medicare Other | Admitting: Oncology

## 2012-09-26 ENCOUNTER — Telehealth: Payer: Self-pay | Admitting: Oncology

## 2012-09-26 ENCOUNTER — Encounter: Payer: Self-pay | Admitting: Oncology

## 2012-09-26 VITALS — BP 151/92 | HR 80 | Temp 96.9°F | Resp 18 | Ht 69.0 in | Wt 224.5 lb

## 2012-09-26 DIAGNOSIS — T83511A Infection and inflammatory reaction due to indwelling urethral catheter, initial encounter: Secondary | ICD-10-CM

## 2012-09-26 DIAGNOSIS — N390 Urinary tract infection, site not specified: Secondary | ICD-10-CM

## 2012-09-26 DIAGNOSIS — C7800 Secondary malignant neoplasm of unspecified lung: Secondary | ICD-10-CM

## 2012-09-26 DIAGNOSIS — I10 Essential (primary) hypertension: Secondary | ICD-10-CM

## 2012-09-26 DIAGNOSIS — C801 Malignant (primary) neoplasm, unspecified: Secondary | ICD-10-CM

## 2012-09-26 DIAGNOSIS — E785 Hyperlipidemia, unspecified: Secondary | ICD-10-CM

## 2012-09-26 DIAGNOSIS — D696 Thrombocytopenia, unspecified: Secondary | ICD-10-CM

## 2012-09-26 DIAGNOSIS — C675 Malignant neoplasm of bladder neck: Secondary | ICD-10-CM

## 2012-09-26 DIAGNOSIS — Z8546 Personal history of malignant neoplasm of prostate: Secondary | ICD-10-CM

## 2012-09-26 DIAGNOSIS — C649 Malignant neoplasm of unspecified kidney, except renal pelvis: Secondary | ICD-10-CM

## 2012-09-26 DIAGNOSIS — D649 Anemia, unspecified: Secondary | ICD-10-CM

## 2012-09-26 DIAGNOSIS — N189 Chronic kidney disease, unspecified: Secondary | ICD-10-CM

## 2012-09-26 DIAGNOSIS — R509 Fever, unspecified: Secondary | ICD-10-CM

## 2012-09-26 LAB — COMPREHENSIVE METABOLIC PANEL (CC13)
BUN: 15.9 mg/dL (ref 7.0–26.0)
Chloride: 102 mEq/L (ref 98–107)
Creatinine: 1.3 mg/dL (ref 0.7–1.3)
Glucose: 103 mg/dl — ABNORMAL HIGH (ref 70–99)
Potassium: 4.3 mEq/L (ref 3.5–5.1)
Sodium: 138 mEq/L (ref 136–145)
Total Bilirubin: 0.49 mg/dL (ref 0.20–1.20)

## 2012-09-26 LAB — CBC WITH DIFFERENTIAL/PLATELET
EOS%: 3.5 % (ref 0.0–7.0)
HGB: 13.1 g/dL (ref 13.0–17.1)
MCH: 30.7 pg (ref 27.2–33.4)
MCV: 89.8 fL (ref 79.3–98.0)
MONO%: 8.4 % (ref 0.0–14.0)
NEUT#: 4.2 10*3/uL (ref 1.5–6.5)
RBC: 4.28 10*6/uL (ref 4.20–5.82)
RDW: 14.9 % — ABNORMAL HIGH (ref 11.0–14.6)
lymph#: 1.6 10*3/uL (ref 0.9–3.3)

## 2012-09-26 NOTE — Progress Notes (Signed)
Hematology and Oncology Follow Up Visit  [name] [id number] 03/20/1927 79 y.o. 09/26/2012 4:34 PM   CC: [name], MD  [name], MD   Principle Diagnosis: This is a pleasant 79 year old gentleman with the following issues:  1. 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 Now has metastatic disease with lung nodules. 2. Prostate cancer: He is s/p treatment with brachytherapy in December 2002. He has had no evidence for cancer recurrence since treatment.   Prior Therapy:  1. He underwent an open left nephroureterectomy done on June 04, 2010. His pathology from that showed [phone], showed that he had a papillary invasive high-grade urothelial carcinoma with negative urinary bladder cuff margins. The tumor was 3.8 cm. No lymphovascular invasion. Again, was a T2 NX disease.  S/P the six cycle of  chemotherapy of Carboplatin and Gemzar. Day 8 cycle 6 was on 4/18.  2. He is S/P the six cycle of  chemotherapy of Carboplatin and Gemzar. Completed on 12/02/2011.   Current therapy:  Observation and follow up.   Interim History: Mr. [name] presents for a follow up visit. He completed the sixth cycle of chemotherapy in 11/2011 and  have recovered well at this time. He is really asymptomatic at this time. He is no longer reporting any fever. He did not report any abdominal pain. He had not reported any chest pain. Had not reported any hematuria. Had not reported any flank pain. He had not reported really any major changes in his performance status or activity level at this time. He has continued to perform activities of daily living without any hindrance or decline. He has continued to drive and attends to again activities of daily living without problems at this point.   Medications: I have reviewed the patient's current medications. Current outpatient prescriptions:acetaminophen (TYLENOL) 500 MG  tablet, Take 500-1,000 mg by mouth every 6 (six) hours as needed. pain, Disp: , Rfl: ;  amLODipine (NORVASC) 10 MG tablet, Take 0.5 tablets (5 mg total) by mouth every morning., Disp: 30 tablet, Rfl: 0;  finasteride (PROSCAR) 5 MG tablet, Take 5 mg by mouth daily after lunch daily after lunch.  , Disp: , Rfl:  ondansetron (ZOFRAN) 8 MG tablet, Take 1 tab two times a day starting the day after chemo for 3 days. Then take 1 tab two times a day as needed for nausea or vomiting., Disp: 30 tablet, Rfl: 2;  simvastatin (ZOCOR) 80 MG tablet, Take 40 mg by mouth at bedtime. , Disp: , Rfl:   Allergies:  Allergies  Allergen Reactions  . Morphine And Related Anxiety  . Penicillins Rash    Past Medical History, Surgical history, Social history, and Family History were reviewed and updated.  Review of Systems: Constitutional:  Negative for fever, chills, night sweats, anorexia, weight loss, pain. Cardiovascular: no chest pain or dyspnea on exertion Respiratory: no cough, shortness of breath, or wheezing Neurological: no TIA or stroke symptoms Dermatological: negative ENT: negative Skin: Negative. Gastrointestinal: no abdominal pain, change in bowel habits, or black or bloody stools Genito-Urinary: no dysuria, trouble voiding, or hematuria Hematological and Lymphatic: negative Breast: negative Musculoskeletal: negative Remaining ROS negative.  Physical Exam: Blood pressure 151/92, pulse 80, temperature 96.9 F (36.1 C), temperature source Oral, resp. rate 18, height 5\' 9"  (1.753 m), weight 224 lb 8 oz (101.833 kg). ECOG: 1 General appearance: alert Head: Normocephalic, without obvious abnormality, atraumatic Neck: no adenopathy, no carotid  bruit, no JVD, supple, symmetrical, trachea midline and thyroid not enlarged, symmetric, no tenderness/mass/nodules Lymph nodes: Cervical, supraclavicular, and axillary nodes normal. Heart:regular rate and rhythm, S1, S2 normal, no murmur, click, rub or  gallop Lung:chest clear, no wheezing, rales, normal symmetric air entry Abdomen: soft, non-tender, without masses or organomegaly EXT:no erythema, induration, or nodules   Lab Results: Lab Results  Component Value Date   WBC 6.6 09/26/2012   HGB 13.1 09/26/2012   HCT 38.5 09/26/2012   MCV 89.8 09/26/2012   PLT 180 09/26/2012     Chemistry      Component Value Date/Time   NA 138 09/26/2012 1308   NA 137 04/06/2012 1445   K 4.3 09/26/2012 1308   K 4.2 04/06/2012 1445   CL 102 09/26/2012 1308   CL 102 04/06/2012 1445   CO2 28 09/26/2012 1308   CO2 27 04/06/2012 1445   BUN 15.9 09/26/2012 1308   BUN 16 04/06/2012 1445   CREATININE 1.3 09/26/2012 1308   CREATININE 1.05 04/06/2012 1445      Component Value Date/Time   CALCIUM 9.2 09/26/2012 1308   CALCIUM 9.4 04/06/2012 1445   ALKPHOS 54 09/26/2012 1308   ALKPHOS 52 03/14/2012 1431   AST 20 09/26/2012 1308   AST 19 03/14/2012 1431   ALT 13 09/26/2012 1308   ALT 14 03/14/2012 1431   BILITOT 0.49 09/26/2012 1308   BILITOT 0.4 03/14/2012 1431     Impression and Plan:  This is a pleasant 79 year old gentleman with the following issues:   1. 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 He had a documented invasive tumor of the left renal pelvis, status post left nephroureterectomy, now with recurrence into the bladder neck area, biopsy proven to be muscle invasive. He has also had imaging studies that showed possible lymphadenopathy, as well as lung nodules. PET scan showed positive FDG uptake for malignancy. This is likely represent stage IV disease. After Six cycles of therapy, his tumor showed a complete response to chemotherapy. CT scan from 05/2012 continued to show no evidence of disease. For now, the plan is active surveillance with repeat imaging studies due in 3 months.   2. History of prostate cancer: not active at this point.  3. Follow up: in 3 months, with port flush every 6 weeks.   4. Anemia: This is related to  chemotherapy and cancer. This has resolved now.      [place], [place] 2/11/20144:34 PM

## 2012-09-26 NOTE — Telephone Encounter (Signed)
gv and printed appt schedule for March, April and May....pt wants to come back to get barium...pt aware central scheduling will contact with d/t of c

## 2012-10-02 IMAGING — US IR FLUORO GUIDE CV LINE*R*
1 series · 2 of 2 positions shown · non-contrast
Comparison: none

CLINICAL DATA: History of urothelial cell carcinoma and metastatic
disease.

[Series 1: ir fluoro guide cv line*right* · 2 of 2 slices shown]
[im 1/2]
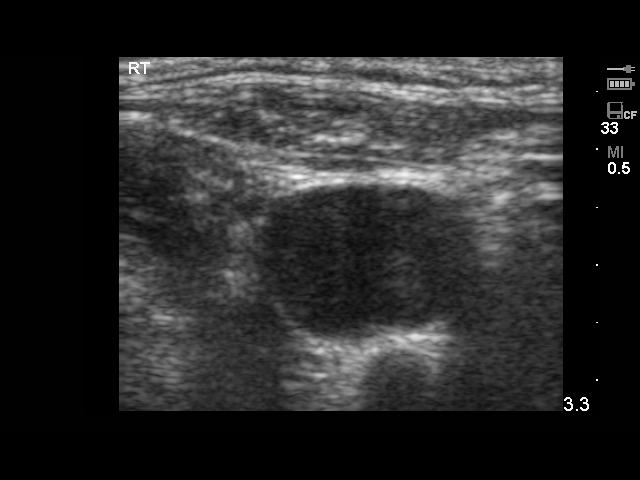
[im 2/2]
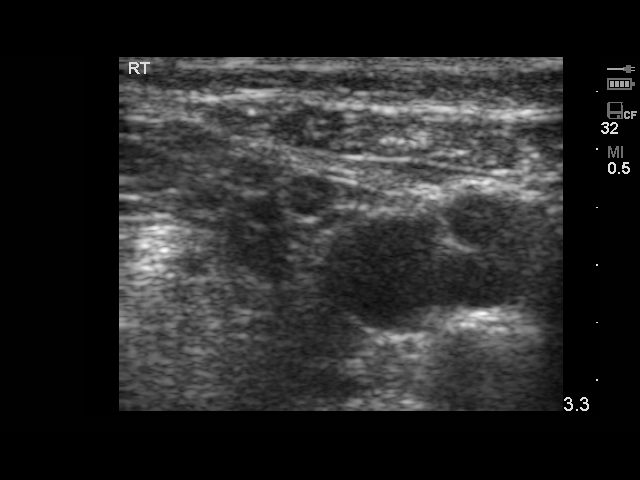

[2 of 2 positions shown; findings below may reference images not displayed]

FLUOROSCOPIC AND ULTRASOUND GUIDED PLACEMENT OF A SUBCUTANEOUS
PORT.

Medications:Versed 2 mg, Fentanyl 100 mcg. Vancomycin was given
within two hours of incision.  Vancomycin was given due to an
antibiotic allergy.A radiology nurse monitored the patient for
moderate sedation.

Moderate sedation time: 30 minutes

Fluoroscopy time: 0.1 minutes

Procedure:  The risks of the procedure were explained to the
patient.  Informed consent was obtained.  Patient was placed supine
on the interventional table.  Ultrasound confirmed a patent right
internal jugular vein.  The right chest and neck were cleaned with
a skin antiseptic and a sterile drape was placed.  Maximal barrier
sterile technique was utilized including caps, mask, sterile gowns,
sterile gloves, sterile drape, hand hygiene and skin antiseptic.
The right neck was anesthetized with 1% lidocaine.  Small incision
was made in the right neck with a blade.  Micropuncture set was
placed in the right IJ with ultrasound guidance.  The micropuncture
wire was used for measurement purposes.  The right chest was
anesthetized with 1% lidocaine with epinephrine.  #15 blade was
used to make an incision and a subcutaneous port pocket was formed.
8 french Power Port was assembled.  Subcutaneous tunnel was formed
with a stiff tunneling device.  The port catheter was brought
through the subcutaneous tunnel.  The port was secured to the chest
wall.  The micropuncture set was exchanged for a peel-away sheath.
The catheter was placed through the peel-away sheath and the tip
was positioned in the lower SVC.  Catheter placement was confirmed
with fluoroscopy.  The port was accessed and flushed with
heparinized saline.  The port pocket was closed using two layers of
absorbable sutures and Dermabond.  The vein skin site was closed
using a single layer of absorbable suture and Dermabond.  Sterile
dressings were applied.  Patient tolerated the procedure well
without an immediate complication.  Ultrasound and fluoroscopic
images were taken and saved for this procedure.

Complications: None
IMPRESSION: Placement of a subcutaneous port device.  The catheter
tip is in the lower SVC and ready to be used.

## 2012-10-27 ENCOUNTER — Ambulatory Visit (HOSPITAL_BASED_OUTPATIENT_CLINIC_OR_DEPARTMENT_OTHER): Payer: Medicare Other

## 2012-10-27 VITALS — BP 136/74 | HR 91 | Temp 98.2°F | Resp 18

## 2012-10-27 DIAGNOSIS — Z452 Encounter for adjustment and management of vascular access device: Secondary | ICD-10-CM

## 2012-10-27 DIAGNOSIS — C649 Malignant neoplasm of unspecified kidney, except renal pelvis: Secondary | ICD-10-CM

## 2012-10-27 DIAGNOSIS — C675 Malignant neoplasm of bladder neck: Secondary | ICD-10-CM

## 2012-10-27 MED ORDER — SODIUM CHLORIDE 0.9 % IJ SOLN
10.0000 mL | INTRAMUSCULAR | Status: DC | PRN
  Administered 2012-10-27: 10 mL via INTRAVENOUS
  Filled 2012-10-27: qty 10

## 2012-10-27 MED ORDER — HEPARIN SOD (PORK) LOCK FLUSH 100 UNIT/ML IV SOLN
500.0000 [IU] | Freq: Once | INTRAVENOUS | Status: AC
  Administered 2012-10-27: 500 [IU] via INTRAVENOUS
  Filled 2012-10-27: qty 5

## 2012-10-27 NOTE — Patient Instructions (Signed)
Call MD for problems or concerns 

## 2012-10-31 IMAGING — CT CT ABD-PELV W/ CM
2 of 6 series · 15 of 46 positions shown, 17 images · IV contrast ([ID] [id number])
Comparison: 06/30/2011

CT CHEST

CLINICAL DATA: Renal cell carcinoma

CT CHEST, ABDOMEN AND PELVIS WITH CONTRAST
TECHNIQUE: Multidetector CT imaging of the chest, abdomen and
pelvis was performed following the standard protocol during bolus
administration of intravenous contrast.
Contrast: 100mL OMNIPAQUE IOHEXOL 300 MG/ML IV SOLN

[Series 2: cap with st · axial · 0.90mm/px · z∈[-638,-52]mm · 12 of 136 slices shown, 14 images]
[im 10/136  soft-tissue]
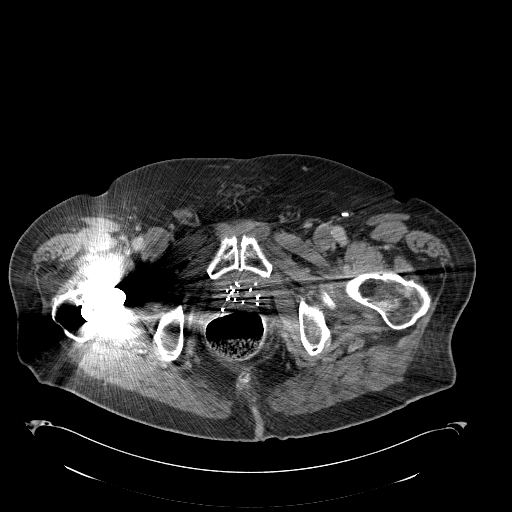
[im 10/136  bone]
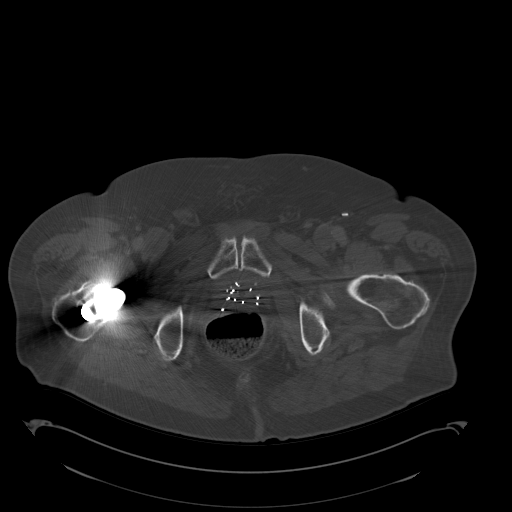
[im 19/136  soft-tissue]
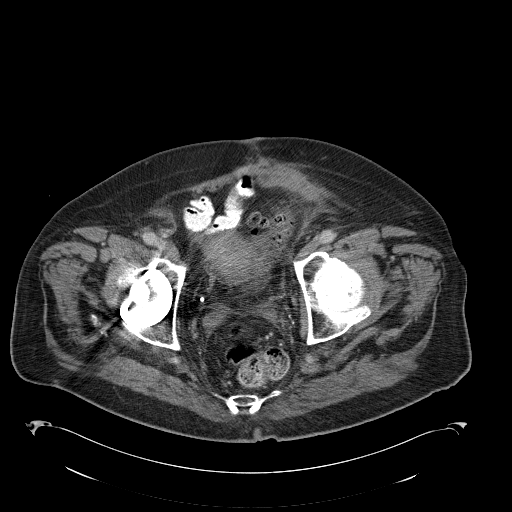
[im 28/136  soft-tissue]
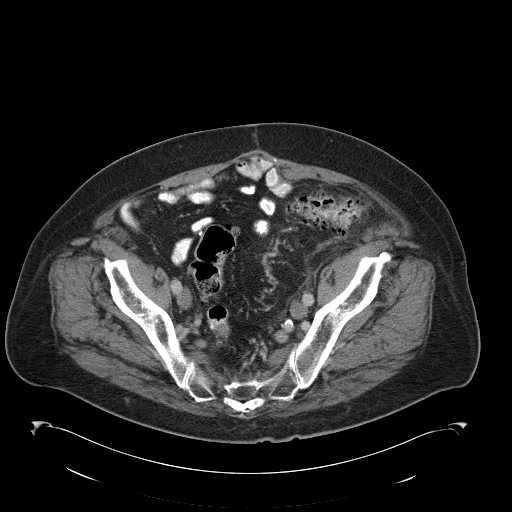
[im 46/136  soft-tissue]
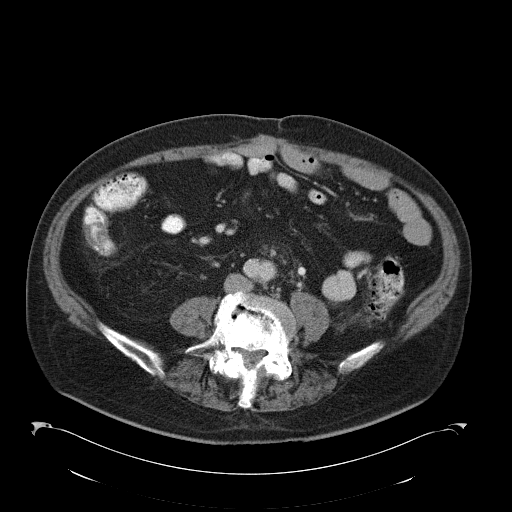
[im 55/136  soft-tissue]
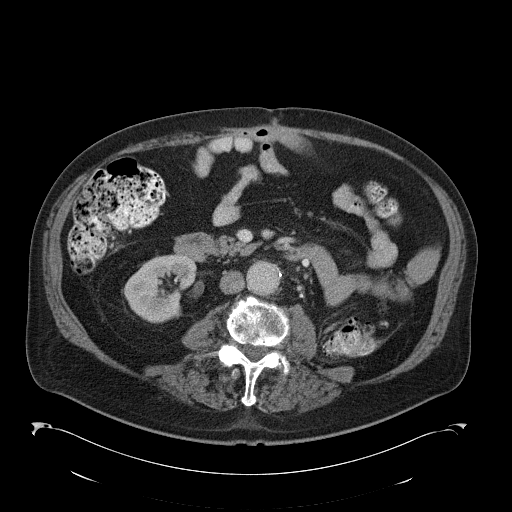
[im 64/136  soft-tissue]
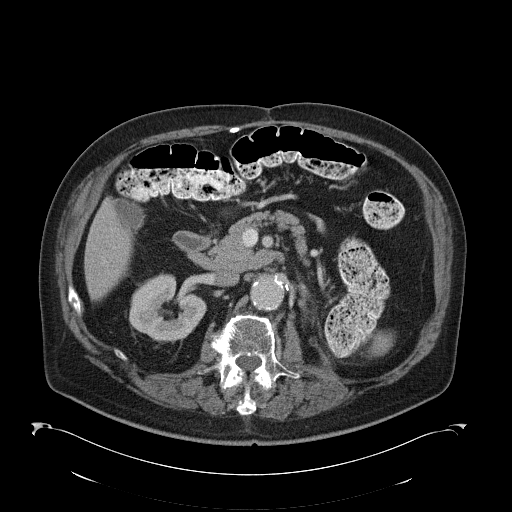
[im 73/136  soft-tissue]
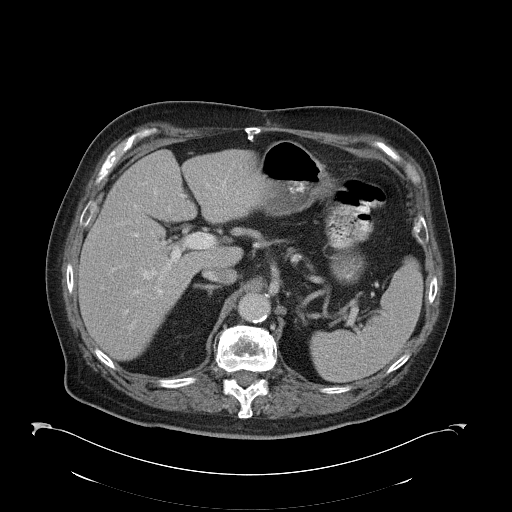
[im 82/136  soft-tissue]
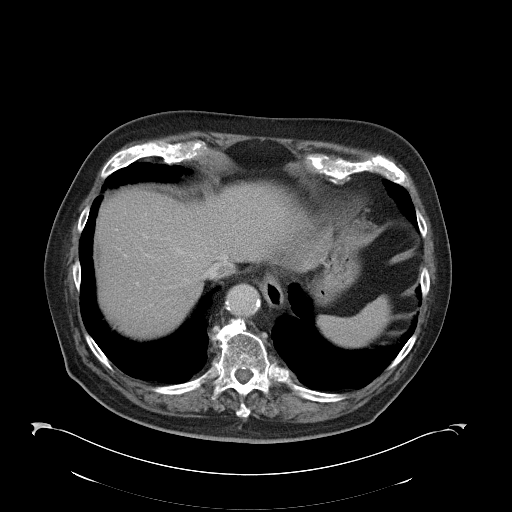
[im 91/136  soft-tissue]
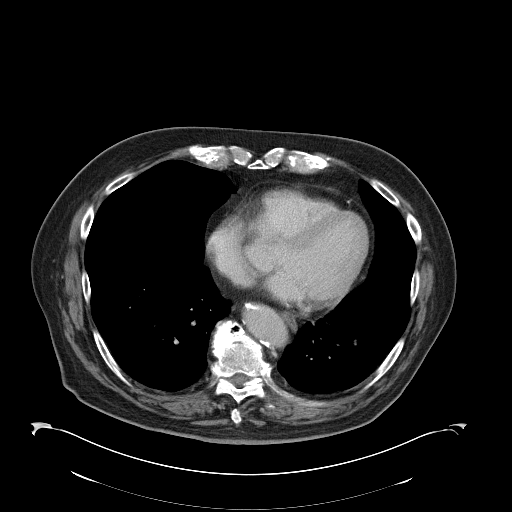
[im 91/136  bone]
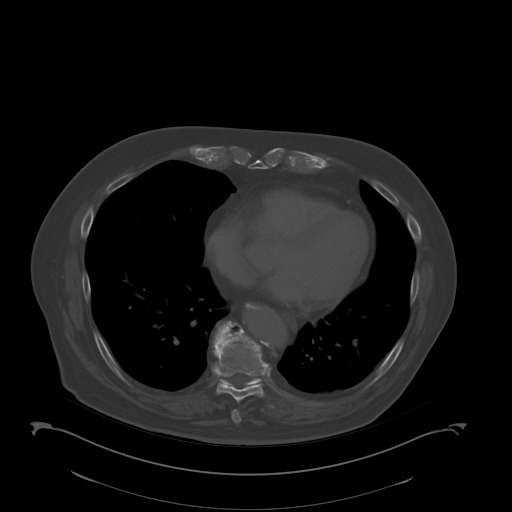
[im 109/136  soft-tissue]
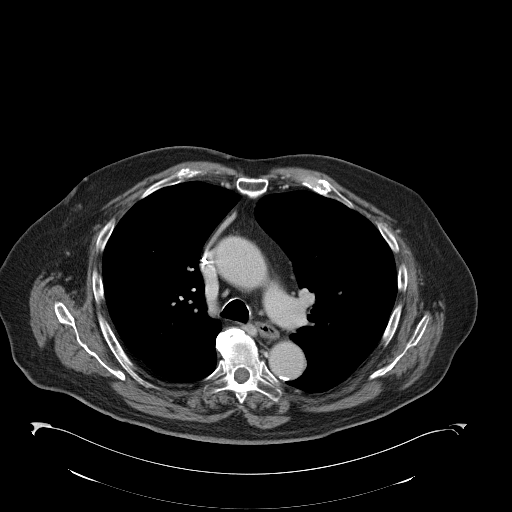
[im 118/136  soft-tissue]
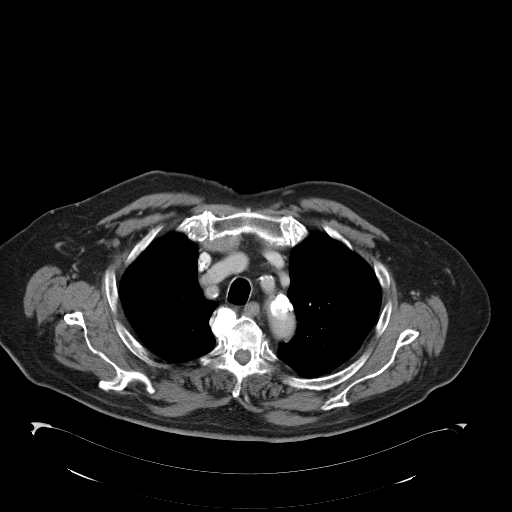
[im 127/136  soft-tissue]
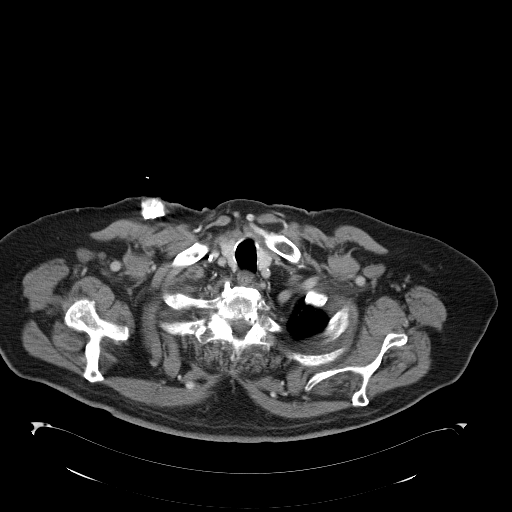

[Series 602: coronal images · coronal · 1.33mm/px · 3 of 113 slices shown]
[im 38/113  soft-tissue]
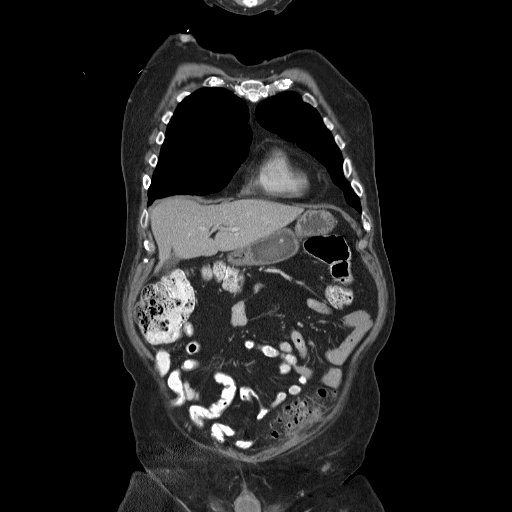
[im 50/113  soft-tissue]
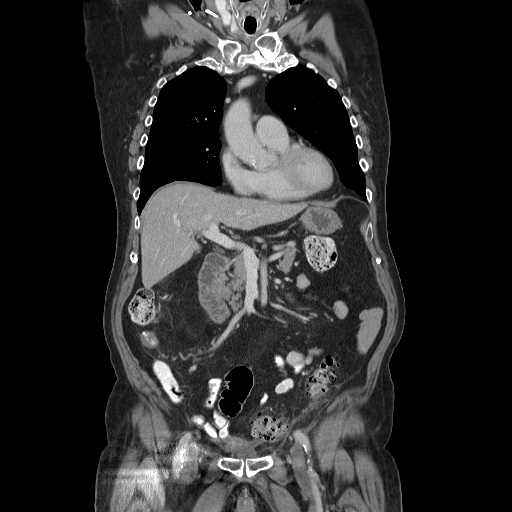
[im 63/113  soft-tissue]
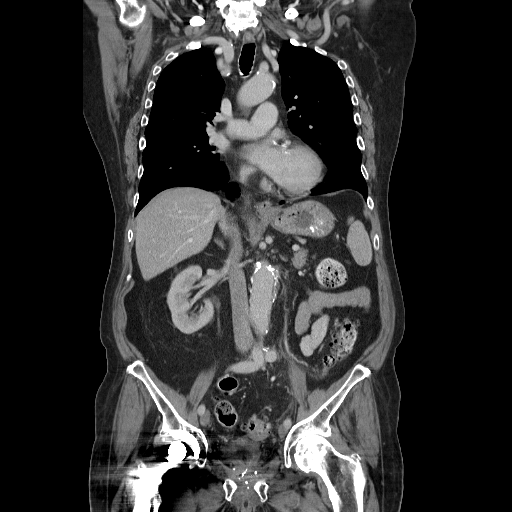

[15 of 46 positions shown; findings below may reference images not displayed]

FINDINGS: There is no enlarged axillary or supraclavicular lymph
nodes.

No enlarged axillary lymph nodes.

There is no mediastinal or hilar lymph nodes identified.

No pericardial or pleural effusion identified.

Advanced calcified atherosclerotic disease involves the LAD and
left circumflex coronary arteries.

Pulmonary nodule within the basilar segment of the left upper lobe
measures 0.7 cm. This whether this is unchanged in size from
previous exam this does appear less solid.

Nodule within superior segment of the left lower lobe measures
cm, image 37.  Previously this measured 1.1 cm.

No new or enlarging nodules or masses identified.  There is
multilevel spondylosis identified within the thoracic spine.
Sclerotic lesion within the proximal right humerus has a
cartilaginous matrix and likely reflects a benign enchondroma.
There is no airspace consolidation identified.
IMPRESSION: 1.  Interval improvement in left lung nodules.
2.  There is atherosclerosis of the thoracic aorta, the great
vessels of the mediastinum and the coronary arteries, including
calcified atherosclerotic plaque in the left circumflex and LAD
coronary arteries.

Atherosclerosis, including left circumflex and LAD coronary artery
disease. Please note that although the presence of coronary artery
calcium documents the presence of coronary artery disease, the
severity of this disease and any potential stenosis cannot be
assessed on this non-gated CT examination.  Assessment for
potential risk factor modification, dietary therapy or
pharmacologic therapy may be warranted, if clinically indicated.

CT ABDOMEN AND PELVIS
FINDINGS: There are no focal liver abnormalities.

The spleen appears within normal limits.

Both adrenal glands are normal.

Gallbladder contains multiple stones.  No cholecystitis.

No biliary ductal dilatation.

The pancreas appears normal.  Normal pancreatic duct.

Both adrenal glands are negative.

The spleen appears normal.

Tiny hypodensity within the inferior pole of the right kidney is
stable measuring 0.7 cm, image 81.  Prior left nephrectomy.

No upper abdominal adenopathy.

At the level of the aortic bifurcation there is a small lymph node
measuring 8.5 mm, image 94.  Previously 1.3 cm.

Left common iliac lymph node measures 5.7 mm, image 98.  Previously
7.8 mm.

The stomach and the small bowel loops have a normal course and
caliber.

Extensive diverticular change involves the sigmoid colon.  Fat
stranding and fluid is identified within the left lower quadrant of
the abdomen which suggests either early or resolving
diverticulitis.  No evidence for bowel perforation or abscess
formation.

Seed implants noted within the prostate gland.  The urinary bladder
is collapsed around a Foley catheter.

The left femoral head avascular necrosis noted.  Prior right total
hip arthroplasty.
IMPRESSION: 1.  Lower abdominal lymph nodes are decreased from previous exam.
2.  No new or progressive disease identified.
3.  Left lower quadrant inflammatory changes are suspicious for
either early or resolving acute diverticulitis.

## 2012-11-21 ENCOUNTER — Other Ambulatory Visit: Payer: Self-pay | Admitting: Orthopaedic Surgery

## 2012-11-21 DIAGNOSIS — M25551 Pain in right hip: Secondary | ICD-10-CM

## 2012-11-22 ENCOUNTER — Ambulatory Visit
Admission: RE | Admit: 2012-11-22 | Discharge: 2012-11-22 | Disposition: A | Discharge status: Home / Self Care | Payer: Medicare Other | Source: Ambulatory Visit | Attending: Orthopaedic Surgery | Admitting: Orthopaedic Surgery

## 2012-11-22 DIAGNOSIS — M25551 Pain in right hip: Secondary | ICD-10-CM

## 2012-11-29 ENCOUNTER — Other Ambulatory Visit (HOSPITAL_COMMUNITY): Payer: Self-pay | Admitting: Orthopaedic Surgery

## 2012-11-29 DIAGNOSIS — M79604 Pain in right leg: Secondary | ICD-10-CM

## 2012-12-08 ENCOUNTER — Encounter (HOSPITAL_COMMUNITY)
Admission: RE | Admit: 2012-12-08 | Discharge: 2012-12-08 | Disposition: A | Discharge status: Home / Self Care | Payer: Medicare Other | Source: Ambulatory Visit | Attending: Orthopaedic Surgery | Admitting: Orthopaedic Surgery

## 2012-12-08 ENCOUNTER — Ambulatory Visit (HOSPITAL_BASED_OUTPATIENT_CLINIC_OR_DEPARTMENT_OTHER): Payer: Medicare Other

## 2012-12-08 ENCOUNTER — Encounter (HOSPITAL_COMMUNITY): Payer: Self-pay

## 2012-12-08 VITALS — BP 107/71 | HR 78 | Temp 97.8°F

## 2012-12-08 DIAGNOSIS — C675 Malignant neoplasm of bladder neck: Secondary | ICD-10-CM

## 2012-12-08 DIAGNOSIS — Z96649 Presence of unspecified artificial hip joint: Secondary | ICD-10-CM | POA: Insufficient documentation

## 2012-12-08 DIAGNOSIS — Z8546 Personal history of malignant neoplasm of prostate: Secondary | ICD-10-CM | POA: Insufficient documentation

## 2012-12-08 DIAGNOSIS — Z452 Encounter for adjustment and management of vascular access device: Secondary | ICD-10-CM

## 2012-12-08 DIAGNOSIS — M79604 Pain in right leg: Secondary | ICD-10-CM

## 2012-12-08 DIAGNOSIS — M25559 Pain in unspecified hip: Secondary | ICD-10-CM | POA: Insufficient documentation

## 2012-12-08 DIAGNOSIS — C649 Malignant neoplasm of unspecified kidney, except renal pelvis: Secondary | ICD-10-CM

## 2012-12-08 DIAGNOSIS — M87059 Idiopathic aseptic necrosis of unspecified femur: Secondary | ICD-10-CM | POA: Insufficient documentation

## 2012-12-08 MED ORDER — HEPARIN SOD (PORK) LOCK FLUSH 100 UNIT/ML IV SOLN
500.0000 [IU] | Freq: Once | INTRAVENOUS | Status: AC
  Administered 2012-12-08: 500 [IU] via INTRAVENOUS
  Filled 2012-12-08: qty 5

## 2012-12-08 MED ORDER — SODIUM CHLORIDE 0.9 % IJ SOLN
10.0000 mL | INTRAMUSCULAR | Status: DC | PRN
  Administered 2012-12-08: 10 mL via INTRAVENOUS
  Filled 2012-12-08: qty 10

## 2012-12-08 MED ORDER — TECHNETIUM TC 99M MEDRONATE IV KIT
25.0000 | PACK | Freq: Once | INTRAVENOUS | Status: AC | PRN
  Administered 2012-12-08: 25 via INTRAVENOUS

## 2012-12-08 NOTE — Patient Instructions (Addendum)
Call MD for problems 

## 2012-12-12 ENCOUNTER — Encounter: Payer: Self-pay | Admitting: Cardiovascular Disease

## 2012-12-13 ENCOUNTER — Other Ambulatory Visit: Payer: Self-pay | Admitting: Oncology

## 2012-12-13 ENCOUNTER — Other Ambulatory Visit (HOSPITAL_BASED_OUTPATIENT_CLINIC_OR_DEPARTMENT_OTHER): Payer: Medicare Other | Admitting: Lab

## 2012-12-13 ENCOUNTER — Ambulatory Visit (HOSPITAL_COMMUNITY)
Admission: RE | Admit: 2012-12-13 | Discharge: 2012-12-13 | Disposition: A | Discharge status: Home / Self Care | Payer: Medicare Other | Source: Ambulatory Visit | Attending: Oncology | Admitting: Oncology

## 2012-12-13 ENCOUNTER — Other Ambulatory Visit: Payer: Medicare Other | Admitting: Lab

## 2012-12-13 ENCOUNTER — Encounter (HOSPITAL_COMMUNITY): Payer: Self-pay

## 2012-12-13 DIAGNOSIS — C649 Malignant neoplasm of unspecified kidney, except renal pelvis: Secondary | ICD-10-CM | POA: Insufficient documentation

## 2012-12-13 DIAGNOSIS — M87059 Idiopathic aseptic necrosis of unspecified femur: Secondary | ICD-10-CM | POA: Insufficient documentation

## 2012-12-13 DIAGNOSIS — C7800 Secondary malignant neoplasm of unspecified lung: Secondary | ICD-10-CM | POA: Insufficient documentation

## 2012-12-13 DIAGNOSIS — Z905 Acquired absence of kidney: Secondary | ICD-10-CM | POA: Insufficient documentation

## 2012-12-13 DIAGNOSIS — C679 Malignant neoplasm of bladder, unspecified: Secondary | ICD-10-CM | POA: Insufficient documentation

## 2012-12-13 DIAGNOSIS — C675 Malignant neoplasm of bladder neck: Secondary | ICD-10-CM

## 2012-12-13 HISTORY — DX: Malignant neoplasm of prostate: C61

## 2012-12-13 HISTORY — DX: Malignant neoplasm of unspecified kidney, except renal pelvis: C64.9

## 2012-12-13 HISTORY — DX: Secondary malignant neoplasm of unspecified lung: C78.00

## 2012-12-13 LAB — COMPREHENSIVE METABOLIC PANEL (CC13)
Albumin: 3.4 g/dL — ABNORMAL LOW (ref 3.5–5.0)
CO2: 29 mEq/L (ref 22–29)
Calcium: 9.3 mg/dL (ref 8.4–10.4)
Glucose: 97 mg/dl (ref 70–99)
Sodium: 136 mEq/L (ref 136–145)
Total Bilirubin: 0.89 mg/dL (ref 0.20–1.20)
Total Protein: 6.8 g/dL (ref 6.4–8.3)

## 2012-12-13 LAB — CBC WITH DIFFERENTIAL/PLATELET
Eosinophils Absolute: 0.2 10*3/uL (ref 0.0–0.5)
HCT: 44.7 % (ref 38.4–49.9)
LYMPH%: 16.2 % (ref 14.0–49.0)
MONO#: 0.9 10*3/uL (ref 0.1–0.9)
NEUT#: 5.1 10*3/uL (ref 1.5–6.5)
Platelets: 125 10*3/uL — ABNORMAL LOW (ref 140–400)
RBC: 4.94 10*6/uL (ref 4.20–5.82)
WBC: 7.5 10*3/uL (ref 4.0–10.3)

## 2012-12-13 MED ORDER — IOHEXOL 300 MG/ML  SOLN
100.0000 mL | Freq: Once | INTRAMUSCULAR | Status: AC | PRN
  Administered 2012-12-13: 100 mL via INTRAVENOUS

## 2012-12-15 ENCOUNTER — Encounter (HOSPITAL_COMMUNITY): Payer: Self-pay

## 2012-12-15 ENCOUNTER — Encounter: Payer: Medicare Other | Admitting: Oncology

## 2012-12-15 ENCOUNTER — Other Ambulatory Visit: Payer: Self-pay

## 2012-12-15 ENCOUNTER — Inpatient Hospital Stay (HOSPITAL_COMMUNITY)
Admission: EM | Admit: 2012-12-15 | Discharge: 2012-12-19 | DRG: 176 | Disposition: A | Discharge status: Home Health | Payer: Medicare Other | Attending: Internal Medicine | Admitting: Internal Medicine

## 2012-12-15 ENCOUNTER — Encounter: Payer: Self-pay | Admitting: Oncology

## 2012-12-15 DIAGNOSIS — Z8546 Personal history of malignant neoplasm of prostate: Secondary | ICD-10-CM

## 2012-12-15 DIAGNOSIS — G8929 Other chronic pain: Secondary | ICD-10-CM | POA: Diagnosis present

## 2012-12-15 DIAGNOSIS — Z9221 Personal history of antineoplastic chemotherapy: Secondary | ICD-10-CM

## 2012-12-15 DIAGNOSIS — I10 Essential (primary) hypertension: Secondary | ICD-10-CM

## 2012-12-15 DIAGNOSIS — C801 Malignant (primary) neoplasm, unspecified: Secondary | ICD-10-CM

## 2012-12-15 DIAGNOSIS — N189 Chronic kidney disease, unspecified: Secondary | ICD-10-CM

## 2012-12-15 DIAGNOSIS — C649 Malignant neoplasm of unspecified kidney, except renal pelvis: Secondary | ICD-10-CM | POA: Diagnosis present

## 2012-12-15 DIAGNOSIS — Z79899 Other long term (current) drug therapy: Secondary | ICD-10-CM

## 2012-12-15 DIAGNOSIS — N390 Urinary tract infection, site not specified: Secondary | ICD-10-CM

## 2012-12-15 DIAGNOSIS — D696 Thrombocytopenia, unspecified: Secondary | ICD-10-CM

## 2012-12-15 DIAGNOSIS — E785 Hyperlipidemia, unspecified: Secondary | ICD-10-CM

## 2012-12-15 DIAGNOSIS — R509 Fever, unspecified: Secondary | ICD-10-CM

## 2012-12-15 DIAGNOSIS — Z880 Allergy status to penicillin: Secondary | ICD-10-CM

## 2012-12-15 DIAGNOSIS — I2699 Other pulmonary embolism without acute cor pulmonale: Principal | ICD-10-CM

## 2012-12-15 DIAGNOSIS — D709 Neutropenia, unspecified: Secondary | ICD-10-CM

## 2012-12-15 DIAGNOSIS — Z905 Acquired absence of kidney: Secondary | ICD-10-CM

## 2012-12-15 DIAGNOSIS — M25551 Pain in right hip: Secondary | ICD-10-CM

## 2012-12-15 DIAGNOSIS — M25559 Pain in unspecified hip: Secondary | ICD-10-CM

## 2012-12-15 DIAGNOSIS — D649 Anemia, unspecified: Secondary | ICD-10-CM

## 2012-12-15 LAB — BASIC METABOLIC PANEL
BUN: 18 mg/dL (ref 6–23)
Creatinine, Ser: 0.97 mg/dL (ref 0.50–1.35)
GFR calc Af Amer: 86 mL/min — ABNORMAL LOW (ref >90)
GFR calc non Af Amer: 74 mL/min — ABNORMAL LOW (ref >90)
Potassium: 3.9 mEq/L (ref 3.5–5.1)

## 2012-12-15 LAB — PROTIME-INR
INR: 1.01 (ref 0.00–1.49)
Prothrombin Time: 13.2 seconds (ref 11.6–15.2)

## 2012-12-15 LAB — CBC
MCHC: 34 g/dL (ref 30.0–36.0)
RDW: 14.6 % (ref 11.5–15.5)
WBC: 9.7 10*3/uL (ref 4.0–10.5)

## 2012-12-15 LAB — APTT: aPTT: 28 seconds (ref 24–37)

## 2012-12-15 MED ORDER — ONDANSETRON HCL 4 MG/2ML IJ SOLN: 4.0000 mg | Freq: Four times a day (QID) | INTRAMUSCULAR | Status: DC | PRN

## 2012-12-15 MED ORDER — HYDROMORPHONE HCL PF 1 MG/ML IJ SOLN
0.5000 mg | INTRAMUSCULAR | Status: DC | PRN
  Administered 2012-12-15: 0.5 mg via INTRAVENOUS
  Filled 2012-12-15: qty 1

## 2012-12-15 MED ORDER — SODIUM CHLORIDE 0.9 % IJ SOLN
3.0000 mL | Freq: Two times a day (BID) | INTRAMUSCULAR | Status: DC
  Administered 2012-12-15 – 2012-12-16 (×3): 3 mL via INTRAVENOUS

## 2012-12-15 MED ORDER — HYDROMORPHONE HCL PF 1 MG/ML IJ SOLN: 0.5000 mg | INTRAMUSCULAR | Status: DC | PRN

## 2012-12-15 MED ORDER — HEPARIN BOLUS VIA INFUSION
4000.0000 [IU] | Freq: Once | INTRAVENOUS | Status: AC
  Administered 2012-12-15: 4000 [IU] via INTRAVENOUS

## 2012-12-15 MED ORDER — FINASTERIDE 5 MG PO TABS
5.0000 mg | ORAL_TABLET | Freq: Every day | ORAL | Status: DC
  Administered 2012-12-16 – 2012-12-19 (×4): 5 mg via ORAL
  Filled 2012-12-15 (×4): qty 1

## 2012-12-15 MED ORDER — ACETAMINOPHEN 650 MG RE SUPP: 650.0000 mg | Freq: Four times a day (QID) | RECTAL | Status: DC | PRN

## 2012-12-15 MED ORDER — HYDROCODONE-ACETAMINOPHEN 5-325 MG PO TABS
2.0000 | ORAL_TABLET | ORAL | Status: DC | PRN
  Administered 2012-12-16 – 2012-12-18 (×9): 2 via ORAL
  Filled 2012-12-15 (×9): qty 2

## 2012-12-15 MED ORDER — ADULT MULTIVITAMIN W/MINERALS CH
1.0000 | ORAL_TABLET | Freq: Every day | ORAL | Status: DC
  Administered 2012-12-15 – 2012-12-19 (×5): 1 via ORAL
  Filled 2012-12-15 (×5): qty 1

## 2012-12-15 MED ORDER — ALBUTEROL SULFATE (5 MG/ML) 0.5% IN NEBU: 2.5000 mg | INHALATION_SOLUTION | RESPIRATORY_TRACT | Status: DC | PRN

## 2012-12-15 MED ORDER — HEPARIN (PORCINE) IN NACL 100-0.45 UNIT/ML-% IJ SOLN
1600.0000 [IU]/h | INTRAMUSCULAR | Status: DC
  Administered 2012-12-15: 1600 [IU]/h via INTRAVENOUS
  Filled 2012-12-15 (×2): qty 250

## 2012-12-15 MED ORDER — ACETAMINOPHEN 325 MG PO TABS: 650.0000 mg | ORAL_TABLET | Freq: Four times a day (QID) | ORAL | Status: DC | PRN

## 2012-12-15 MED ORDER — AMLODIPINE BESYLATE 5 MG PO TABS
5.0000 mg | ORAL_TABLET | Freq: Every day | ORAL | Status: DC
  Administered 2012-12-15 – 2012-12-19 (×5): 5 mg via ORAL
  Filled 2012-12-15 (×5): qty 1

## 2012-12-15 MED ORDER — DOCUSATE SODIUM 100 MG PO CAPS
100.0000 mg | ORAL_CAPSULE | Freq: Two times a day (BID) | ORAL | Status: DC
  Administered 2012-12-15 – 2012-12-19 (×8): 100 mg via ORAL
  Filled 2012-12-15 (×9): qty 1

## 2012-12-15 MED ORDER — ENOXAPARIN SODIUM 100 MG/ML ~~LOC~~ SOLN
100.0000 mg | Freq: Two times a day (BID) | SUBCUTANEOUS | Status: DC
  Administered 2012-12-15 – 2012-12-19 (×8): 100 mg via SUBCUTANEOUS
  Filled 2012-12-15 (×11): qty 1

## 2012-12-15 MED ORDER — ONDANSETRON HCL 4 MG PO TABS: 4.0000 mg | ORAL_TABLET | Freq: Four times a day (QID) | ORAL | Status: DC | PRN

## 2012-12-15 MED ORDER — ATORVASTATIN CALCIUM 40 MG PO TABS
40.0000 mg | ORAL_TABLET | Freq: Every day | ORAL | Status: DC
  Administered 2012-12-15 – 2012-12-18 (×4): 40 mg via ORAL
  Filled 2012-12-15 (×5): qty 1

## 2012-12-15 NOTE — ED Notes (Signed)
Patient also has a urethral leg bag, intact and draining.

## 2012-12-15 NOTE — Progress Notes (Signed)
[facility] ED CM noted CM consult. Spoke with patient and family with permission regarding EDP recommendation for [facility] services. Provided patient with [facility] agency choices. Discussed with patient and family Medicare guidelines for admission.  Pt being admitted CM will f/u.

## 2012-12-15 NOTE — Progress Notes (Signed)
This encounter was created in error - please disregard.

## 2012-12-15 NOTE — Progress Notes (Signed)
ANTICOAGULATION CONSULT NOTE - Initial Consult  Pharmacy Consult for Heparin Indication: pulmonary embolus  Allergies  Allergen Reactions  . Morphine And Related Anxiety  . Penicillins Rash    Patient Measurements:   101.8kg, 69 in 09/2012 IBW 71kg, Heparin dosing wt 93kg  Vital Signs: Temp: 99.1 F (37.3 C) (05/02 1419) Temp src: Oral (05/02 1419) BP: 105/63 mmHg (05/02 1419) Pulse Rate: 91 (05/02 1419)  Labs:  Recent Labs  12/13/12 0931  HGB 15.0  HCT 44.7  PLT 125*  CREATININE 1.3    The CrCl is unknown because both a height and weight (above a minimum accepted value) are required for this calculation.   Medical History: Past Medical History  Diagnosis Date  . Hypertension   . Chronic kidney disease 06/15/11    bladder cancer  . Blood transfusion   . Arthritis   . Anemia 08/16/2011  . transitional cell of genitourinary tract dx'd 2011    invasive tumor of lt renal pelvis  . Prostate cancer dx'd 07/2001    brachytherapy  . Metastasis to lung   . Papillary transitional cell carcinoma, renal dx'd 05/2010    papillary invasive hi grade urothelial ca    Medications:  Scheduled:   Infusions:   PRN: HYDROmorphone (DILAUDID) injection  Assessment: 73 YOM admitted 12/15/2012 with small L PE found on outpatient CT done 4/30. Pharmacy asked to dose Heparin. Baseline labs have been ordered, not yet collected  Goal of Therapy:  Heparin level 0.3-0.7 units/ml Monitor platelets by anticoagulation protocol: Yes   Plan:  Heparin bolus 4000 units x 1 then infuse at 1600 units/hour Heparin level at midnight and daily Daily CBC  Gwen Her PharmD  [phone] 12/15/2012 3:03 PM

## 2012-12-15 NOTE — ED Notes (Signed)
MD at bedside. 

## 2012-12-15 NOTE — Progress Notes (Signed)
Please make sure that the bed alarm is on-patient forgets and gets up without help. Fall risk

## 2012-12-15 NOTE — Progress Notes (Unsigned)
The results of his CT scan was discussed over the phone with [name]. Although no cancer noted, he did have a small PE.  He reports over the phone (could not make it to his appointment) that he has hip pain that has made him bed ridden and this is likely what caused him to have a PE.  Patient family came to clinic today and with the patient permission, I discussed these findings with them.   I recommended the following to them:   1. I see no cancer relapse at this point.  2. Given his sever pain, debilitation and his recent PE he needs to go to the ED ASAP. 3. He will need consultation from his Orthopedic doctor.  4. He will need full dose anticoagulation once he is hospitalized. Likely IV heparin.  5. If he needs an operation, there is no Oncological reason why he should not have it.

## 2012-12-15 NOTE — H&P (Signed)
[facility] History and Physical  [name] ZOX:[id number] DOB: [birth year] DOA: 12/15/2012  Referring physician: Dr. Linwood Dibbles, EDP PCP: Ginette Otto, MD  OP Specialists:  1. Oncology: Dr. Eli Hose 2. Orthopedics: Dr. Norlene Campbell  Chief Complaint: Right Hip pain.  HPI: [name] is a 79 y.o. 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 His images were reviewed at the cancer center and patient was advised to come to the ED. Patient has chronic right hip pain attributed to mild functioning right hip prosthesis? Fracture and is followed by orthopedics. In the last 4 weeks, patient's pain has progressively gotten severe to a point where he is mostly sedentary on his reclining chair with very minimal activity. His pain is worse on movement rated as 10/10 in severity. No pain without movements. Orthopedic M.D. was waiting for cancer surveillance prior to proceeding with complicated surgery. Patient had 2-3 episodes of nonspecific mild fleeting left-sided chest pain which lasted a few seconds and resolved spontaneously without any associated factors. He denies dyspnea or asymmetrical leg swellings. In the ED, patient is hemodynamically stable and hospitalist admission is requested.    Review of Systems: all systems reviewed and apart from history of presenting illness, are negative   Past Medical History  Diagnosis Date  . Hypertension   . Chronic kidney disease 06/15/11     bladder cancer  . Blood transfusion   . Arthritis   . Anemia 08/16/2011  . transitional cell of genitourinary tract dx'd 2011    invasive tumor of lt renal pelvis  . Prostate cancer dx'd 07/2001    brachytherapy  . Metastasis to lung   . Papillary transitional cell carcinoma, renal dx'd 05/2010    papillary invasive hi grade urothelial ca   Past Surgical History  Procedure Laterality Date  . Colon surgery    . Appendectomy    . Joint replacement    . Vascular surgery      aortic anurysm repair  . Nephrectomy      left kidney removed Sept 2011  . Arcuate keratectomy    . Bladder surgery      Bx of bladder x4  . Prostate biopsy      Prostate seeds/radiation  . Cystoscopy  06/21/2011    Procedure: CYSTOSCOPY;  Surgeon: Crecencio Mc, MD;  Location: [facility];  Service: Urology;  Laterality: N/A;  . Transurethral resection of bladder tumor  06/21/2011    Procedure: TRANSURETHRAL RESECTION OF BLADDER TUMOR (TURBT);  Surgeon: Crecencio Mc, MD;  Location: [facility];  Service: Urology;  Laterality: N/A;  . Transurethral resection of prostate  06/21/2011    Procedure: TRANSURETHRAL RESECTION OF THE PROSTATE (TURP);  Surgeon: Crecencio Mc, MD;  Location: [facility];  Service: Urology;  Laterality: N/A;  . Cystoscopy with urethral dilatation  06/21/2011    Procedure: CYSTOSCOPY WITH URETHRAL DILATATION;  Surgeon: Crecencio Mc, MD;  Location: [facility];  Service: Urology;  Laterality: N/A;   Social History:  reports that he quit smoking about 29 years ago. His smoking use included Cigarettes. He smoked 0.00 packs per day. He  has never used smokeless tobacco. He reports that he does not drink alcohol. His drug history is not on file. Married. Lives with spouse.   Allergies  Allergen Reactions  . Morphine And Related Anxiety  . Penicillins Rash    History reviewed. No pertinent family history. negative family history.   Prior to Admission medications   Medication Sig Start Date End Date Taking?  Authorizing Provider  amLODipine (NORVASC) 10 MG tablet Take 0.5 tablets (5 mg total) by mouth every morning. 08/20/11  Yes Laveda Norman, MD  finasteride (PROSCAR) 5 MG tablet Take 5 mg by mouth daily after lunch daily after lunch.     Yes Historical Provider, MD  HYDROcodone-acetaminophen (NORCO/VICODIN) 5-325 MG per tablet Take 2 tablets by mouth every 4 (four) hours as needed for pain.   Yes Historical Provider, MD  Multiple Vitamin (MULTIVITAMIN WITH MINERALS) TABS Take 1 tablet by mouth daily.   Yes Historical Provider, MD  simvastatin (ZOCOR) 80 MG tablet Take 40 mg by mouth at bedtime.    Yes Historical Provider, MD   Physical Exam: Filed Vitals:   12/15/12 1416 12/15/12 1419 12/15/12 1530 12/15/12 1600  BP: 105/63 105/63 123/71 125/84  Pulse:  91 81 85  Temp:  99.1 F (37.3 C)    TempSrc:  Oral    Resp:  18 18 16   SpO2:  99% 98% 98%     General exam: Moderately built and nourished male patient, lying comfortably supine on the gurney in no obvious distress.  Head, eyes and ENT: Nontraumatic and normocephalic. Pupils equally reacting to light and accommodation. Oral mucosa moist.  Neck: Supple. No JVD, carotid bruit or thyromegaly.  Lymphatics: No lymphadenopathy.  Respiratory system: Clear to auscultation. No increased work of breathing.  Cardiovascular system: S1 and S2 heard, RRR. No JVD, murmurs, gallops, clicks or pedal edema. Telemetry shows normal sinus rhythm.   Gastrointestinal system: Abdomen is nondistended, soft and nontender. Normal bowel sounds heard. No organomegaly or masses appreciated. Midline laparotomy scar.  Central nervous system: Alert and oriented. No focal neurological deficits.  Extremities: Symmetric 5 x 5 power. Peripheral pulses symmetrically felt. Movements of right hip limited secondary to pain but no acute findings externally.   Skin: No rashes or acute findings.  Musculoskeletal system: Negative exam.  Psychiatry: Pleasant and  cooperative.   Labs on Admission:  Basic Metabolic Panel:  Recent Labs Lab 12/13/12 0931 12/15/12 1450  NA 136 133*  K 4.7 3.9  CL 97* 98  CO2 29 27  GLUCOSE 97 98  BUN 22.9 18  CREATININE 1.3 0.97  CALCIUM 9.3 8.8   Liver Function Tests:  Recent Labs Lab 12/13/12 0931  AST 14  ALT 17  ALKPHOS 63  BILITOT 0.89  PROT 6.8  ALBUMIN 3.4*   No results found for this basename: LIPASE, AMYLASE,  in the last 168 hours No results found for this basename: AMMONIA,  in the last 168 hours CBC:  Recent Labs Lab 12/13/12 0931 12/15/12 1450  WBC 7.5 9.7  NEUTROABS 5.1  --   HGB 15.0 13.0  HCT 44.7 38.2*  MCV 90.6 89.3  PLT 125* 98*   Cardiac Enzymes: No results found for this basename: CKTOTAL, CKMB, CKMBINDEX, TROPONINI,  in the last 168 hours  BNP (last 3 results) No results found for this basename: PROBNP,  in the last 8760 hours CBG: No results found for this basename: GLUCAP,  in the last 168 hours  Radiological Exams on Admission:  CT  chest: IMPRESSION: 1. No evidence of thoracic metastasis. 2. Right lower lobe pulmonary embolism.  CT abdomen and pelvis: IMPRESSION: 1. No evidence of local recurrence in the left nephrectomy bed. 2. No evidence of the bladder cancer metastasis. 3. Avascular necrosis of the left femoral head. 4. Foley catheter in the bladder. Bladder poorly assessed    Assessment/Plan Principal Problem:   Pulmonary embolism Active Problems:   Renal cell cancer   History of prostate cancer   Thrombocytopenia   HTN (hypertension)   Hyperlipemia   Hip pain, chronic- right   1. Right lower lobe pulmonary embolism: Likely due to immobility from right hip pain in the context of cancer. Patient hemodynamically stable and asymptomatic. Discussed with primary oncologist Dr. Clelia Croft who recommends starting patient on full dose Lovenox pending orthopedic evaluation on 12/18/2012. If surgery is planned during this hospitalization, Lovenox can be  temporarily held/switched to IV heparin but if surgery not planned during this hospitalization, patient may be started on oral anticoagulants and discharged home. Monitor on telemetry. 2. Right hip pain, chronic and progressively worsening: EDP discussed with primary orthopedic M.D. Dr. Norlene Campbell who is not available for this weekend and will see patient on 12/18/12. In the interim we'll try to control pain and get PT and OT to evaluate. 3. Hypertension: Controlled. Continue home medications. 4. Chronic thrombocytopenia: Seems stable. Followup CBC in a.m. 5. Transitional cell carcinoma genitourinary tract/indwelling Foley catheter: No metastatic disease on imaging on 12/13/12. Discussed with the oncologist.     Code Status: Full  Family Communication: Discussed with patient spouse and daughter at bedside  Disposition Plan: Admit inpatient to telemetry. Home when medically stable.   Time spent: 65 minutes  [facility] Pager [phone]  If 7PM-7AM, please contact night-coverage [email or web address] Password [facility] 12/15/2012, 4:51 PM

## 2012-12-15 NOTE — ED Notes (Signed)
WUJ:[id number]<JY> Expected date:<BR> Expected time:<BR> Means of arrival:<BR> Comments:<BR> Hip/chest pain

## 2012-12-15 NOTE — ED Provider Notes (Addendum)
History    CSN: [id number] Arrival date & time 12/15/12  1334 First MD Initiated Contact with Patient 12/15/12 1422      Chief Complaint  Patient presents with  . Hip Pain  . Shortness of Breath   Patient is a 79 y.o. male presenting with shortness of breath.  Shortness of Breath  The patient presents the emergency room after being diagnosed as an outpatient with a pulmonary embolism. Patient has history of renal carcinoma. He has history of metastatic disease to the lung. Patient had a routine outpatient CT scan 2 days ago. The CT scan did not show any evidence of recurrent cancer but did demonstrate a pulmonary embolism. the patient was called with results today and was told to come to the emergency department.  The patient denies chest pain or shortness of breath. He has had some minor discomfort with deep inspiration.  Patient does have right hip pain. This has been ongoing for at least the last month or so. He has seen his orthopedist Dr. [name]. the patient has not been very active and has been mostly bedridden because of this hip pain  Past Medical History  Diagnosis Date  . Hypertension   . Chronic kidney disease 06/15/11    bladder cancer  . Blood transfusion   . Arthritis   . Anemia 08/16/2011  . transitional cell of genitourinary tract dx'd 2011    invasive tumor of lt renal pelvis  . Prostate cancer dx'd 07/2001    brachytherapy  . Metastasis to lung   . Papillary transitional cell carcinoma, renal dx'd 05/2010    papillary invasive hi grade urothelial ca    Past Surgical History  Procedure Laterality Date  . Colon surgery    . Appendectomy    . Joint replacement    . Vascular surgery      aortic anurysm repair  . Nephrectomy      left kidney removed Sept 2011  . Arcuate keratectomy    . Bladder surgery      Bx of bladder x4  . Prostate biopsy      Prostate seeds/radiation  . Cystoscopy  06/21/2011    Procedure: CYSTOSCOPY;  Surgeon: Crecencio Mc, MD;   Location: [facility];  Service: Urology;  Laterality: N/A;  . Transurethral resection of bladder tumor  06/21/2011    Procedure: TRANSURETHRAL RESECTION OF BLADDER TUMOR (TURBT);  Surgeon: Crecencio Mc, MD;  Location: [facility];  Service: Urology;  Laterality: N/A;  . Transurethral resection of prostate  06/21/2011    Procedure: TRANSURETHRAL RESECTION OF THE PROSTATE (TURP);  Surgeon: Crecencio Mc, MD;  Location: [facility];  Service: Urology;  Laterality: N/A;  . Cystoscopy with urethral dilatation  06/21/2011    Procedure: CYSTOSCOPY WITH URETHRAL DILATATION;  Surgeon: Crecencio Mc, MD;  Location: [facility];  Service: Urology;  Laterality: N/A;    History reviewed. No pertinent family history.  History  Substance Use Topics  . Smoking status: Former Smoker    Types: Cigarettes    Quit date: 07/16/1983  . Smokeless tobacco: Never Used  . Alcohol Use: No      Review of Systems  Respiratory: Positive for shortness of breath.   All other systems reviewed and are negative.    Allergies  Morphine and related and Penicillins  Home Medications   Current Outpatient Rx  Name  Route  Sig  Dispense  Refill  . amLODipine (NORVASC) 10 MG tablet   Oral   Take 0.5 tablets (5 mg  total) by mouth every morning.   30 tablet   0   . finasteride (PROSCAR) 5 MG tablet   Oral   Take 5 mg by mouth daily after lunch daily after lunch.           Marland Kitchen HYDROcodone-acetaminophen (NORCO/VICODIN) 5-325 MG per tablet   Oral   Take 2 tablets by mouth every 4 (four) hours as needed for pain.         . Multiple Vitamin (MULTIVITAMIN WITH MINERALS) TABS   Oral   Take 1 tablet by mouth daily.         . simvastatin (ZOCOR) 80 MG tablet   Oral   Take 40 mg by mouth at bedtime.            BP 125/84  Pulse 85  Temp(Src) 99.1 F (37.3 C) (Oral)  Resp 16  SpO2 98%  Physical Exam  Nursing note and vitals reviewed. Constitutional: He appears well-developed and well-nourished. No distress.  HENT:  Head:  Normocephalic and atraumatic.  Right Ear: External ear normal.  Left Ear: External ear normal.  Eyes: Conjunctivae are normal. Right eye exhibits no discharge. Left eye exhibits no discharge. No scleral icterus.  Neck: Neck supple. No tracheal deviation present.  Cardiovascular: Normal rate, regular rhythm and intact distal pulses.   Pulmonary/Chest: Effort normal and breath sounds normal. No stridor. No respiratory distress. He has no wheezes. He has no rales.  Abdominal: Soft. Bowel sounds are normal. He exhibits no distension. There is no tenderness. There is no rebound and no guarding.  Musculoskeletal: He exhibits tenderness. He exhibits no edema.       Right hip: He exhibits decreased range of motion, decreased strength, tenderness and bony tenderness. He exhibits no swelling.  Neurological: He is alert. He has normal strength. No sensory deficit. Cranial nerve deficit:  no gross defecits noted. He exhibits normal muscle tone. He displays no seizure activity. Coordination normal.  Skin: Skin is warm and dry. No rash noted.  Psychiatric: He has a normal mood and affect.    ED Course  Procedures (including critical care time)  Labs Reviewed  BASIC METABOLIC PANEL - Abnormal; Notable for the following:    Sodium 133 (*)    GFR calc non Af Amer 74 (*)    GFR calc Af Amer 86 (*)    All other components within normal limits  CBC - Abnormal; Notable for the following:    HCT 38.2 (*)    Platelets 98 (*)    All other components within normal limits  PROTIME-INR  APTT   No results found.   1. Pulmonary embolism   2. Hip pain, right       MDM  Patient had a CT scan 2 days ago that showed a pulmonary embolism. The patient is relatively asymptomatic the he will be admitted to the hospital. I have ordered IV heparin. Patient continues to have pain in his right hip. I will consult his orthopedist well he is in the hospital.   Celene Kras, MD 12/15/12 1558  I discussed the case  with Dr [name].  He is out of town this weekend.  His partners will not be as familiar with the case.  No acute surgical intervention is required.  His plan was conservative treatment for at least a week.  If symptoms persisted he would consider surgery but that would be very invasive and require and extensive rehab.  He will be available to see the  patient if he is still in the hospital on Monday.  Celene Kras, MD 12/15/12 [phone]

## 2012-12-15 NOTE — Progress Notes (Signed)
Faxed release for surgery, to [facility] [phone], from a an oncologist point of view, noting that patient has a clot in his lung and needs to be on heparin, before and after surgery

## 2012-12-15 NOTE — ED Notes (Addendum)
Strong Dorsalis Pedis pulse in the right foot. No SOB noted . Lungs are clear. Right chest portacath accessed.

## 2012-12-15 NOTE — Progress Notes (Signed)
UR Completed.  

## 2012-12-15 NOTE — ED Notes (Addendum)
PER EMS patient had 1 year fu for cancer. They found a small left PE. DR Juliette Alcide is his oncologist.Patient has no SOB. Past few days patient has noticed  Minor chest pain with deep inspiration.

## 2012-12-16 LAB — CBC
HCT: 36.8 % — ABNORMAL LOW (ref 39.0–52.0)
Hemoglobin: 12.2 g/dL — ABNORMAL LOW (ref 13.0–17.0)
MCHC: 33.2 g/dL (ref 30.0–36.0)
MCV: 90.9 fL (ref 78.0–100.0)
RDW: 14.8 % (ref 11.5–15.5)
WBC: 6.7 10*3/uL (ref 4.0–10.5)

## 2012-12-16 MED ORDER — SODIUM CHLORIDE 0.9 % IJ SOLN
10.0000 mL | INTRAMUSCULAR | Status: DC | PRN
  Administered 2012-12-19 (×2): 10 mL

## 2012-12-16 NOTE — Progress Notes (Signed)
[facility] PROGRESS NOTE  [name] QIO:[id number] DOB: 24-Jan-1927 DOA: 12/15/2012 PCP: Ginette Otto, MD  Assessment/Plan: Principal Problem:  Pulmonary embolism, RLL -continue lovenox only for now pending ortho eval on 5/5 Active Problems:  Hip pain, chronic- right -chronic and progressively worsening: EDP discussed with primary orthopedic M.D. Dr. Norlene Campbell- not available this weekend, will see patient on 5/5 -pain management Hypertension: Controlled. Continue home medications.  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 Discussed with the oncologist per admitting MD        Code Status: full Family Communication: daughter at bedside Disposition Plan: pending PT eval   Consultants:  Ortho to see on 5/5  Procedures:  none  Antibiotics:  none  HPI/Subjective: Still with increased right hip pain with any mov't- none at rest. Denies CP, no sob  Objective: Filed Vitals:   12/15/12 1730 12/15/12 1823 12/15/12 2106 12/16/12 0457  BP: 114/73 111/71 109/65 105/62  Pulse: 81 79 91 69  Temp:  98.1 F (36.7 C) 98.3 F (36.8 C) 97.3 F (36.3 C)  TempSrc:  Oral Oral Oral  Resp: 14 15 18 18   Height:  6' (1.829 m)    Weight:  97 kg (213 lb 13.5 oz)  97.2 kg (214 lb 4.6 oz)  SpO2: 97% 100% 100% 98%    Intake/Output Summary (Last 24 hours) at 12/16/12 0949 Last data filed at 12/16/12 3244  Gross per 24 hour  Intake 1139.73 ml  Output   1680 ml  Net -540.27 ml   Filed Weights   12/15/12 1823 12/16/12 0457  Weight: 97 kg (213 lb 13.5 oz) 97.2 kg (214 lb 4.6 oz)    Exam:   General: alert and oriented x3, in NAD  Cardiovascular: RRR  Respiratory: CTAB  Abdomen: soft +BS NT/ND  Extremities: no cyanosis and no edema   Data Reviewed: Basic Metabolic Panel:  Recent Labs Lab 12/13/12 0931 12/15/12 1450  NA  136 133*  K 4.7 3.9  CL 97* 98  CO2 29 27  GLUCOSE 97 98  BUN 22.9 18  CREATININE 1.3 0.97  CALCIUM 9.3 8.8   Liver Function Tests:  Recent Labs Lab 12/13/12 0931  AST 14  ALT 17  ALKPHOS 63  BILITOT 0.89  PROT 6.8  ALBUMIN 3.4*   No results found for this basename: LIPASE, AMYLASE,  in the last 168 hours No results found for this basename: AMMONIA,  in the last 168 hours CBC:  Recent Labs Lab 12/13/12 0931 12/15/12 1450 12/16/12 0435  WBC 7.5 9.7 6.7  NEUTROABS 5.1  --   --   HGB 15.0 13.0 12.2*  HCT 44.7 38.2* 36.8*  MCV 90.6 89.3 90.9  PLT 125* 98* 111*   Cardiac Enzymes: No results found for this basename: CKTOTAL, CKMB, CKMBINDEX, TROPONINI,  in the last 168 hours BNP (last 3 results) No results found for this basename: PROBNP,  in the last 8760 hours CBG: No results found for this basename: GLUCAP,  in the last 168 hours  No results found for this or any previous visit (from the past 240 hour(s)).   Studies: No results found.  Scheduled Meds: . amLODipine  5 mg Oral Daily  . atorvastatin  40 mg Oral q1800  . docusate sodium  100 mg Oral BID  . enoxaparin (LOVENOX) injection  100 mg Subcutaneous Q12H  . finasteride  5 mg Oral QPC lunch  . multivitamin with  minerals  1 tablet Oral Daily  . sodium chloride  3 mL Intravenous Q12H   Continuous Infusions:   Principal Problem:   Pulmonary embolism Active Problems:   Renal cell cancer   History of prostate cancer   Thrombocytopenia   HTN (hypertension)   Hyperlipemia   Hip pain, chronic- right    Time spent: 22    [name] C  [facility] Pager [phone] If 7PM-7AM, please contact night-coverage at [email or web address], password [facility] 12/16/2012, 9:49 AM  LOS: 1 day

## 2012-12-16 NOTE — Evaluation (Signed)
Physical Therapy Evaluation Patient Details Name: [name] MRN: [id number] DOB: 01/03/1927 Today's Date: 12/16/2012 Time: 1100-1119 PT Time Calculation (min): 19 min  PT Assessment / Plan / Recommendation Clinical Impression  79 yo male admitted with new PE, and acute/subacute R hip pain. Hx of cancer, and R THA.  There is some question if pt has possible fracture or if he has some issues with prosthesis??? On eval, pt required Min assist for mobility/safety-able to ambulate ~50 feet with RW but with 7-8/10 pain. Mobility is limited by pain. Pt states that he is supposed to follow up with ortho MD on Monday, however pt is unsure if that will be in hospital or as outpatient. Pt states his wife is unable to physically assist him. Feel pt would benefit from Ortho consult during hospital stay. Feel pt needs to be supervision-modified independent level with mobility to safely d/c back home.     PT Assessment  Patient needs continued PT services    Follow Up Recommendations  Home health PT;Supervision for mobility/OOB    Does the patient have the potential to tolerate intense rehabilitation      Barriers to Discharge        Equipment Recommendations  None recommended by PT    Recommendations for Other Services OT consult   Frequency Min 3X/week    Precautions / Restrictions Precautions Precautions: Fall Precaution Comments: ? if some sort of fracture has occurred or if there is an issue with prosthesis itself Restrictions Weight Bearing Restrictions: No Other Position/Activity Restrictions: however, encouraged pt to limit full WBing on R LE for safety/ toleration   Pertinent Vitals/Pain 7-8/10 R hip with activity      Mobility  Bed Mobility Bed Mobility: Supine to Sit Supine to Sit: HOB elevated;4: Min guard Transfers Transfers: Sit to Stand;Stand to Sit Sit to Stand: 4: Min assist;From bed;From elevated surface Stand to Sit: 4: Min assist;To chair/3-in-1;With  armrests Details for Transfer Assistance: VCS safety, technique, hand placement. Assist to rise, stabilize, control descent Ambulation/Gait Ambulation/Gait Assistance: 4: Min assist Ambulation Distance (Feet): 50 Feet Assistive device: Rolling walker Ambulation/Gait Assistance Details: VCS safety, limit WBing/avoid full WBing at this time. Pt states he has already been doing this due to pain.  Gait Pattern: Step-to pattern;Step-through pattern;Decreased stride length;Antalgic    Exercises     PT Diagnosis: Difficulty walking;Abnormality of gait;Acute pain  PT Problem List: Decreased range of motion;Decreased activity tolerance;Decreased mobility;Pain PT Treatment Interventions: DME instruction;Gait training;Stair training;Functional mobility training;Therapeutic activities;Therapeutic exercise;Patient/family education   PT Goals Acute Rehab PT Goals PT Goal Formulation: With patient Time For Goal Achievement: 12/30/12 Potential to Achieve Goals: Good Pt will go Supine/Side to Sit: with modified independence PT Goal: Supine/Side to Sit - Progress: Goal set today Pt will go Sit to Supine/Side: with modified independence PT Goal: Sit to Supine/Side - Progress: Goal set today Pt will go Sit to Stand: with supervision PT Goal: Sit to Stand - Progress: Goal set today Pt will Ambulate: 51 - 150 feet;with supervision;with least restrictive assistive device PT Goal: Ambulate - Progress: Goal set today Pt will Go Up / Down Stairs: 1-2 stairs;with supervision;with least restrictive assistive device PT Goal: Up/Down Stairs - Progress: Goal set today  Visit Information  Last PT Received On: 12/16/12 Assistance Needed: +1    Subjective Data  Subjective: I can move it side to side. Its up and down and weightbearing that kills me. Patient Stated Goal: Less pain. Find out what's going on.  Prior Functioning  Home Living Lives With: Spouse Type of Home: House Home Access: Stairs to  enter Entrance Stairs-Number of Steps: 1 Home Layout: One level Home Adaptive Equipment: Walker - rolling;Straight cane;Shower chair without back;Bedside commode/3-in-1 Prior Function Level of Independence: Independent Able to Take Stairs?: Yes Driving: Yes Comments: up until 1-2 weeks ago, then pt had to use assistive device Communication Communication: No difficulties    Cognition  Cognition Arousal/Alertness: Awake/alert Behavior During Therapy: WFL for tasks assessed/performed Overall Cognitive Status: Within Functional Limits for tasks assessed    Extremity/Trunk Assessment Right Lower Extremity Assessment RLE ROM/Strength/Tone: Deficits;Unable to fully assess;Due to pain RLE ROM/Strength/Tone Deficits: pt able to move R LE-reports painful motions to be hip flexion.  Left Lower Extremity Assessment LLE ROM/Strength/Tone: WFL for tasks assessed Trunk Assessment Trunk Assessment: Normal   Balance    End of Session PT - End of Session Equipment Utilized During Treatment: Gait belt Activity Tolerance: Patient limited by pain Patient left: in chair;with call bell/phone within reach;with family/visitor present  GP     Rebeca Alert, MPT Pager: [phone]

## 2012-12-17 NOTE — Progress Notes (Signed)
Pt adm Lovenox without problem. Dgt Cindy observed.

## 2012-12-17 NOTE — Progress Notes (Signed)
[facility] PROGRESS NOTE  [name] ZOX:[id number] DOB: [birth year] DOA: 12/15/2012 PCP: Ginette Otto, MD  Assessment/Plan: Principal Problem:  Pulmonary embolism, RLL -continue lovenox only for now pending ortho eval on 5/5 Active Problems:  Hip pain, chronic- right -chronic and progressively worsening: EDP discussed with primary orthopedic M.D. Dr. Norlene Campbell- not available this weekend, will see patient on 5/5 -continue pain management Hypertension: Controlled. Continue home medications.  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 Discussed with the oncologist per admitting MD        Code Status: full Family Communication: daughter at bedside Disposition Plan:  PT recommending hh   Consultants:  Ortho to see on 5/5  Procedures:  none  Antibiotics:  none  HPI/Subjective: States right hip pain unchanged with any mov't- none at rest. Denies CP, no sob  Objective: Filed Vitals:   12/16/12 1507 12/16/12 2100 12/16/12 2101 12/17/12 0550  BP: 106/65 88/72 100/62 143/73  Pulse: 68 70  68  Temp: 97.6 F (36.4 C) 97.1 F (36.2 C)  97.8 F (36.6 C)  TempSrc: Oral Oral  Oral  Resp: 18 18  18   Height:      Weight:    97.6 kg (215 lb 2.7 oz)  SpO2: 97% 96%  100%    Intake/Output Summary (Last 24 hours) at 12/17/12 0937 Last data filed at 12/17/12 0819  Gross per 24 hour  Intake    880 ml  Output   3200 ml  Net  -2320 ml   Filed Weights   12/15/12 1823 12/16/12 0457 12/17/12 0550  Weight: 97 kg (213 lb 13.5 oz) 97.2 kg (214 lb 4.6 oz) 97.6 kg (215 lb 2.7 oz)    Exam:   General: alert and oriented x3, in NAD  Cardiovascular: RRR  Respiratory: CTAB  Abdomen: soft +BS NT/ND  Extremities: no cyanosis and no edema   Data Reviewed: Basic Metabolic Panel:  Recent Labs Lab 12/13/12 0931 12/15/12 1450   NA 136 133*  K 4.7 3.9  CL 97* 98  CO2 29 27  GLUCOSE 97 98  BUN 22.9 18  CREATININE 1.3 0.97  CALCIUM 9.3 8.8   Liver Function Tests:  Recent Labs Lab 12/13/12 0931  AST 14  ALT 17  ALKPHOS 63  BILITOT 0.89  PROT 6.8  ALBUMIN 3.4*   No results found for this basename: LIPASE, AMYLASE,  in the last 168 hours No results found for this basename: AMMONIA,  in the last 168 hours CBC:  Recent Labs Lab 12/13/12 0931 12/15/12 1450 12/16/12 0435  WBC 7.5 9.7 6.7  NEUTROABS 5.1  --   --   HGB 15.0 13.0 12.2*  HCT 44.7 38.2* 36.8*  MCV 90.6 89.3 90.9  PLT 125* 98* 111*   Cardiac Enzymes: No results found for this basename: CKTOTAL, CKMB, CKMBINDEX, TROPONINI,  in the last 168 hours BNP (last 3 results) No results found for this basename: PROBNP,  in the last 8760 hours CBG: No results found for this basename: GLUCAP,  in the last 168 hours  No results found for this or any previous visit (from the past 240 hour(s)).   Studies: No results found.  Scheduled Meds: . amLODipine  5 mg Oral Daily  . atorvastatin  40 mg Oral q1800  . docusate sodium  100 mg Oral BID  . enoxaparin (LOVENOX) injection  100 mg Subcutaneous Q12H  . finasteride  5 mg Oral  QPC lunch  . multivitamin with minerals  1 tablet Oral Daily  . sodium chloride  3 mL Intravenous Q12H   Continuous Infusions:   Principal Problem:   Pulmonary embolism Active Problems:   Renal cell cancer   History of prostate cancer   Thrombocytopenia   HTN (hypertension)   Hyperlipemia   Hip pain, chronic- right    Time spent: 18    Chalmer Zheng C  [facility] Pager [phone] If 7PM-7AM, please contact night-coverage at [email or web address], password [facility] 12/17/2012, 9:37 AM  LOS: 2 days

## 2012-12-18 DIAGNOSIS — N189 Chronic kidney disease, unspecified: Secondary | ICD-10-CM

## 2012-12-18 DIAGNOSIS — C801 Malignant (primary) neoplasm, unspecified: Secondary | ICD-10-CM

## 2012-12-18 LAB — CBC
HCT: 37.6 % — ABNORMAL LOW (ref 39.0–52.0)
Hemoglobin: 12.4 g/dL — ABNORMAL LOW (ref 13.0–17.0)
MCH: 29.7 pg (ref 26.0–34.0)
MCHC: 33 g/dL (ref 30.0–36.0)
RDW: 14.9 % (ref 11.5–15.5)

## 2012-12-18 LAB — BASIC METABOLIC PANEL
BUN: 21 mg/dL (ref 6–23)
Chloride: 101 mEq/L (ref 96–112)
Creatinine, Ser: 1.04 mg/dL (ref 0.50–1.35)
Glucose, Bld: 90 mg/dL (ref 70–99)
Potassium: 4.3 mEq/L (ref 3.5–5.1)

## 2012-12-18 MED ORDER — PATIENT'S GUIDE TO USING COUMADIN BOOK
Freq: Once | Status: AC
  Administered 2012-12-19: 1
  Filled 2012-12-18 (×2): qty 1

## 2012-12-18 MED ORDER — WARFARIN SODIUM 7.5 MG PO TABS
7.5000 mg | ORAL_TABLET | Freq: Once | ORAL | Status: AC
  Administered 2012-12-18: 7.5 mg via ORAL
  Filled 2012-12-18: qty 1

## 2012-12-18 MED ORDER — WARFARIN VIDEO
Freq: Once | Status: AC
  Administered 2012-12-19: 10:00:00

## 2012-12-18 MED ORDER — WARFARIN - PHARMACIST DOSING INPATIENT: Freq: Every day | Status: DC

## 2012-12-18 NOTE — Progress Notes (Addendum)
ANTICOAGULATION CONSULT NOTE - Initial Consult  Pharmacy Consult for Warfarin Indication: VTE treatment  Allergies  Allergen Reactions  . Morphine And Related Anxiety  . Penicillins Rash    Patient Measurements: Height: 6' (182.9 cm) Weight: 213 lb 6.5 oz (96.8 kg) IBW/kg (Calculated) : 77.6 Heparin Dosing Weight:   Vital Signs: Temp: 98 F (36.7 C) (05/05 1419) Temp src: Oral (05/05 1419) BP: 108/64 mmHg (05/05 1419) Pulse Rate: 76 (05/05 1419)  Labs:  Recent Labs  12/16/12 0435 12/18/12 0330  HGB 12.2* 12.4*  HCT 36.8* 37.6*  PLT 111* 129*  CREATININE  --  1.04    Estimated Creatinine Clearance: 64.9 ml/min (by C-G formula based on Cr of 1.04).   Medical History: Past Medical History  Diagnosis Date  . Hypertension   . Chronic kidney disease 06/15/11    bladder cancer  . Blood transfusion   . Arthritis   . Anemia 08/16/2011  . transitional cell of genitourinary tract dx'd 2011    invasive tumor of lt renal pelvis  . Prostate cancer dx'd 07/2001    brachytherapy  . Metastasis to lung   . 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 amLODipine  5 mg Oral Daily  . atorvastatin  40 mg Oral q1800  . docusate sodium  100 mg Oral BID  . enoxaparin (LOVENOX) injection  100 mg Subcutaneous Q12H  . finasteride  5 mg Oral QPC lunch  . multivitamin with minerals  1 tablet Oral Daily  . sodium chloride  3 mL Intravenous Q12H   Infusions:   PRN: acetaminophen, acetaminophen, albuterol, HYDROcodone-acetaminophen, HYDROmorphone (DILAUDID) injection, ondansetron (ZOFRAN) IV, ondansetron, sodium chloride  Assessment: 80 yo M with Right THR 14 years ago. Now with pain. Although MRI/CT show some erosion of proximal stem of femoral prothesis, it is well fixed distally. Question of occult stress fracture. No immediate plans for surgery. Goal of Therapy:  INR 2-3 Monitor  platelets by anticoagulation protocol: Yes   Plan:  1. Begin Coumadin 7.5mg  x 1 tonight at 8pm (patient is also on Lovenox 100mg  SQ bid) 2. Monitor labs in AM 3. Daily PT/INR, CBC 4. Coumadin teaching book, video ordered for 5/6  Loletta Specter 12/18/2012,7:21 PM

## 2012-12-18 NOTE — Evaluation (Signed)
Occupational Therapy Evaluation Patient Details Name: [name] MRN: [id number] DOB: [birth year] Today's Date: 12/18/2012 Time: 7829-5621 OT Time Calculation (min): 24 min  OT Assessment / Plan / Recommendation Clinical Impression  Pt was admitted with new PE, and acute/subacute R hip pain. Hx of cancer, and R THA  14 years ago. There is some question if pt has possible fracture or if he has some issues with prosthesis.  Awaiting ortho consult.  Pt reports improvement in R hip pain and ability to ambulate compared to PT evaluation visit.  Ambulated and performed ADL with RW and supervision.  No further OT needs.     OT Assessment  Patient does not need any further OT services    Follow Up Recommendations  No OT follow up    Barriers to Discharge      Equipment Recommendations  None recommended by OT    Recommendations for Other Services    Frequency       Precautions / Restrictions Precautions Precautions: Fall Precaution Comments: ? if some sort of fracture has occurred Restrictions Weight Bearing Restrictions: No Other Position/Activity Restrictions: however, encouraged pt to limit full WBing on R LE   Pertinent Vitals/Pain 3/10 R hip with ambulation, premedicated    ADL  Eating/Feeding: Independent Where Assessed - Eating/Feeding: Chair Grooming: Wash/dry hands;Supervision/safety Where Assessed - Grooming: Unsupported standing Upper Body Bathing: Set up Where Assessed - Upper Body Bathing: Unsupported sitting Lower Body Bathing: Supervision/safety Where Assessed - Lower Body Bathing: Unsupported sitting;Supported sit to stand Upper Body Dressing: Set up Where Assessed - Upper Body Dressing: Unsupported sitting Lower Body Dressing: Supervision/safety Where Assessed - Lower Body Dressing: Unsupported sitting;Supported sit to stand Toilet Transfer: Supervision/safety Toilet Transfer Method: Sit to Barista: Raised toilet seat with arms (or  3-in-1 over toilet) Toileting - Clothing Manipulation and Hygiene: Supervision/safety Where Assessed - Engineer, mining and Hygiene: Sit to stand from 3-in-1 or toilet Equipment Used: Gait belt;Rolling walker Transfers/Ambulation Related to ADLs: supervision with RW ADL Comments: Pt able to reach down to R foot to donn/doff sock, crosses L foot over R knee to donn/doff L sock.    OT Diagnosis:    OT Problem List:   OT Treatment Interventions:     OT Goals    Visit Information  Last OT Received On: 12/18/12 Assistance Needed: +1    Subjective Data  Subjective: "I couldn't do this when I came in here." Patient Stated Goal: Walk without pain.   Prior Functioning     Home Living Lives With: Spouse Available Help at Discharge: Family;Available 24 hours/day Type of Home: House Home Access: Stairs to enter [employer] of Steps: 1 Home Layout: One level Bathroom Shower/Tub: Health visitor: Standard Home Adaptive Equipment: Walker - rolling;Straight cane;Shower chair without back;Bedside commode/3-in-1 Prior Function Level of Independence: Independent Able to Take Stairs?: Yes Driving: Yes Vocation: Retired Musician: No difficulties Dominant Hand: Right         Vision/Perception Vision - History Baseline Vision: Wears glasses all the time Patient Visual Report: No change from baseline   Cognition  Cognition Arousal/Alertness: Awake/alert Behavior During Therapy: WFL for tasks assessed/performed Overall Cognitive Status: Within Functional Limits for tasks assessed    Extremity/Trunk Assessment Right Upper Extremity Assessment RUE ROM/Strength/Tone: Within functional levels RUE Coordination: WFL - gross/fine motor Left Upper Extremity Assessment LUE ROM/Strength/Tone: Within functional levels LUE Coordination: WFL - gross/fine motor Trunk Assessment Trunk Assessment: Normal     Mobility Bed  Mobility Bed Mobility: Not assessed Transfers Transfers: Sit to Stand;Stand to Sit Sit to Stand: 5: Supervision;With upper extremity assist;From chair/3-in-1 Stand to Sit: 5: Supervision;With upper extremity assist;To chair/3-in-1     Exercise     Balance     End of Session OT - End of Session Activity Tolerance: Patient tolerated treatment well Patient left: in chair;with call bell/phone within reach;with nursing in room;with family/visitor present Nurse Communication: Mobility status  GO     Evern Bio 12/18/2012, 1:42 PM [phone]

## 2012-12-18 NOTE — Care Management Note (Addendum)
    Page 1 of 2   12/19/2012     2:30:07 PM   CARE MANAGEMENT NOTE 12/19/2012  Patient:  [name], [name]   Account Number:  [id number]  Date Initiated:  12/18/2012  Documentation initiated by:  [facility]  Subjective/Objective Assessment:   ADMITTED W/SOB.PULMONARY EMBOLISM.     Action/Plan:   FROM HOME.HAS PCP,PHARMACY.   Anticipated DC Date:  12/19/2012   Anticipated DC Plan:  HOME W HOME HEALTH SERVICES      DC Planning Services  CM consult      Choice offered to / List presented to:  C-1 Patient        HH arranged  HH-1 RN  HH-2 PT      [facility] agency  [facility]   Status of service:  Completed, signed off Medicare Important Message given?   (If response is "NO", the following Medicare IM given date fields will be blank) Date Medicare IM given:   Date Additional Medicare IM given:    Discharge Disposition:  HOME W HOME HEALTH SERVICES  Per UR Regulation:  Reviewed for med. necessity/level of care/duration of stay  If discussed at Long Length of Stay Meetings, dates discussed:    Comments:  12/19/12 Kassidie Hendriks RN,BSN NCM [phone] [facility] AWARE OF HHR/PT ORDERS FOR D/C. [facility] CHOSEN FOR HH.[facility](REP) ALREADY FOLLOWING FOR D/C.PATIENT STATES HE WILL SEE DR. Danton Clap WHEN D/C.IF INR NEEDED TO BE CHECKED HHRN CAN PERFORM.PT-HH.IF ORDERED CAN ARRANGE.  12/18/12 Shyanne Mcclary RN,BSN NCM [phone] [facility] AGENCY LIST HAS BEEN PROVIDED.PT-HH.

## 2012-12-18 NOTE — Progress Notes (Signed)
Physical Therapy Treatment Patient Details Name: [name] MRN: [id number] DOB: 1927/05/28 Today's Date: 12/18/2012 Time: 7829-5621 PT Time Calculation (min): 11 min  PT Assessment / Plan / Recommendation Comments on Treatment Session  Pt agreeable to ambulation however short distance due to increase to 6-7/10 R hip pain.  Pt reports pain much better since admission.  Pt waiting on ortho consult.  Pt stated he has not taken pain meds since 9 AM and notified RN of pt request for pain meds.    Follow Up Recommendations  Home health PT;Supervision for mobility/OOB     Does the patient have the potential to tolerate intense rehabilitation     Barriers to Discharge        Equipment Recommendations  None recommended by PT    Recommendations for Other Services    Frequency     Plan Discharge plan remains appropriate;Frequency remains appropriate    Precautions / Restrictions Precautions Precautions: Fall Precaution Comments: ? if some sort of fracture has occurred Restrictions Weight Bearing Restrictions: No Other Position/Activity Restrictions: however, encouraged pt to limit full WBing on R LE   Pertinent Vitals/Pain 4/10 R LE upon standing which increased to 6-7/10 during ambulation , RN notified of pt request for meds, declined ice pack    Mobility  Bed Mobility Bed Mobility: Not assessed Transfers Sit to Stand: 5: Supervision;With upper extremity assist;From chair/3-in-1 Stand to Sit: 5: Supervision;With upper extremity assist;To chair/3-in-1 Details for Transfer Assistance: verbal cues for safe technique Ambulation/Gait Ambulation/Gait Assistance: 4: Min guard Ambulation Distance (Feet): 40 Feet (x2) Assistive device: Rolling walker Ambulation/Gait Assistance Details: pt reported 4/10 R LE pain initially however once increased to 6-7/10 had pt take short standing rest break and then ambulate back to room (declined recliner), pt "testing" R LE and reports soreness  with IR/ER and increased pain with hip flexion, verbal cues for use of RW for UEs to WB and decrease WB on R LE for pain control Gait Pattern: Step-to pattern;Step-through pattern;Decreased stride length;Antalgic Gait velocity: decreased General Gait Details: initially step through but changed to step to with pain increase    Exercises     PT Diagnosis:    PT Problem List:   PT Treatment Interventions:     PT Goals Acute Rehab PT Goals Pt will go Sit to Stand: with modified independence PT Goal: Sit to Stand - Progress: Updated due to goal met Pt will Ambulate: 51 - 150 feet;with supervision;with least restrictive assistive device PT Goal: Ambulate - Progress: Progressing toward goal  Visit Information  Last PT Received On: 12/18/12 Assistance Needed: +1    Subjective Data  Subjective: Alright let's go.   Cognition  Cognition Arousal/Alertness: Awake/alert Behavior During Therapy: WFL for tasks assessed/performed Overall Cognitive Status: Within Functional Limits for tasks assessed    Balance     End of Session PT - End of Session Activity Tolerance: Patient limited by pain Patient left: in chair;with call bell/phone within reach;with family/visitor present   GP     Lavelle Berland,KATHrine E 12/18/2012, 3:56 PM Zenovia Jarred, PT, DPT 12/18/2012 Pager: [phone]

## 2012-12-18 NOTE — Progress Notes (Signed)
[facility] PROGRESS NOTE  [name] ZOX:[id number] DOB: 1927-02-16 DOA: 12/15/2012 PCP: Ginette Otto, MD  Assessment/Plan: Principal Problem:  Pulmonary embolism, RLL -continue lovenox only for now pending ortho eval  today Active Problems:  Hip pain, chronic- right -chronic and progressively worsening: EDP discussed with primary orthopedic M.D. Dr. Norlene Campbell -Up with Dr. [name] today 5/5 and he'll see patient -continue pain management Hypertension: Controlled. Continue home medications.  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 Discussed with the oncologist per admitting MD        Code Status: full Family Communication: daughter at bedside Disposition Plan:  PT recommending hh   Consultants:  Ortho to see on 5/5  Procedures:  none  Antibiotics:  none  HPI/Subjective: States he was able to ambulate better today, denies chest pain no shortness of breath  Objective: Filed Vitals:   12/17/12 0550 12/17/12 1428 12/17/12 2124 12/18/12 0645  BP: 143/73 132/63 107/71 137/75  Pulse: 68 90 88 72  Temp: 97.8 F (36.6 C) 98.7 F (37.1 C) 97.9 F (36.6 C) 97.8 F (36.6 C)  TempSrc: Oral Oral Oral Oral  Resp: 18 16 20 20   Height:      Weight: 97.6 kg (215 lb 2.7 oz)   96.8 kg (213 lb 6.5 oz)  SpO2: 100% 96% 97% 97%    Intake/Output Summary (Last 24 hours) at 12/18/12 1235 Last data filed at 12/18/12 0900  Gross per 24 hour  Intake    480 ml  Output   2400 ml  Net  -1920 ml   Filed Weights   12/16/12 0457 12/17/12 0550 12/18/12 0645  Weight: 97.2 kg (214 lb 4.6 oz) 97.6 kg (215 lb 2.7 oz) 96.8 kg (213 lb 6.5 oz)    Exam:   General: alert and oriented x3, in NAD  Cardiovascular: RRR  Respiratory: CTAB  Abdomen: soft +BS NT/ND  Extremities: no cyanosis and no edema   Data Reviewed: Basic  Metabolic Panel:  Recent Labs Lab 12/13/12 0931 12/15/12 1450 12/18/12 0330  NA 136 133* 136  K 4.7 3.9 4.3  CL 97* 98 101  CO2 29 27 28   GLUCOSE 97 98 90  BUN 22.9 18 21   CREATININE 1.3 0.97 1.04  CALCIUM 9.3 8.8 8.9   Liver Function Tests:  Recent Labs Lab 12/13/12 0931  AST 14  ALT 17  ALKPHOS 63  BILITOT 0.89  PROT 6.8  ALBUMIN 3.4*   No results found for this basename: LIPASE, AMYLASE,  in the last 168 hours No results found for this basename: AMMONIA,  in the last 168 hours CBC:  Recent Labs Lab 12/13/12 0931 12/15/12 1450 12/16/12 0435 12/18/12 0330  WBC 7.5 9.7 6.7 5.9  NEUTROABS 5.1  --   --   --   HGB 15.0 13.0 12.2* 12.4*  HCT 44.7 38.2* 36.8* 37.6*  MCV 90.6 89.3 90.9 90.2  PLT 125* 98* 111* 129*   Cardiac Enzymes: No results found for this basename: CKTOTAL, CKMB, CKMBINDEX, TROPONINI,  in the last 168 hours BNP (last 3 results) No results found for this basename: PROBNP,  in the last 8760 hours CBG: No results found for this basename: GLUCAP,  in the last 168 hours  No results found for this or any previous visit (from the past 240 hour(s)).   Studies: No results found.  Scheduled Meds: . amLODipine  5 mg Oral Daily  . atorvastatin  40  mg Oral q1800  . docusate sodium  100 mg Oral BID  . enoxaparin (LOVENOX) injection  100 mg Subcutaneous Q12H  . finasteride  5 mg Oral QPC lunch  . multivitamin with minerals  1 tablet Oral Daily  . sodium chloride  3 mL Intravenous Q12H   Continuous Infusions:   Principal Problem:   Pulmonary embolism Active Problems:   Renal cell cancer   History of prostate cancer   Thrombocytopenia   HTN (hypertension)   Hyperlipemia   Hip pain, chronic- right    Time spent: 12    [name] C  [facility] Pager [phone] If 7PM-7AM, please contact night-coverage at [email or web address], password [facility] 12/18/2012, 12:35 PM  LOS: 3 days

## 2012-12-18 NOTE — Progress Notes (Signed)
Patient ID: [name], male   DOB: 1927-07-04, 79 y.o.   MRN: [id number] [name] is well known to me with past hx right THR 14 yrs ago. He was doing well with his hip and right LE until about 3-4 weeks ago when he experienced insidious onset of right thigh pain which has progressed to the point of ambulatory compromise.To date studies including an MRI of the right hip,lab work and CT scan demonstrate no evidence of infection( normal sed rate and CRP). There is some erosion about the proximal stem of the femoral prosthesis which is hot by bone scan, but well fixed distally.I am not sure if there is an occult stress fracture. My plan is to watch this over the next several weeks and limit his WB'ing and hope for spontaneous healing. Okay to D/C on coumadin or other oral anticoagulants as I have no plans for any immediate surgical treatment. Will F/U in office.

## 2012-12-19 DIAGNOSIS — D649 Anemia, unspecified: Secondary | ICD-10-CM

## 2012-12-19 LAB — PROTIME-INR
INR: 0.98 (ref 0.00–1.49)
Prothrombin Time: 12.9 seconds (ref 11.6–15.2)

## 2012-12-19 LAB — CBC
Platelets: 127 10*3/uL — ABNORMAL LOW (ref 150–400)
RDW: 14.7 % (ref 11.5–15.5)
WBC: 4.6 10*3/uL (ref 4.0–10.5)

## 2012-12-19 MED ORDER — ENOXAPARIN SODIUM 100 MG/ML ~~LOC~~ SOLN: 100.0000 mg | Freq: Two times a day (BID) | SUBCUTANEOUS | Status: AC

## 2012-12-19 MED ORDER — ENOXAPARIN (LOVENOX) PATIENT EDUCATION KIT
PACK | Freq: Once | Status: DC
  Filled 2012-12-19: qty 1

## 2012-12-19 MED ORDER — WARFARIN SODIUM 5 MG PO TABS: 5.0000 mg | ORAL_TABLET | Freq: Once | ORAL | Status: AC

## 2012-12-19 MED ORDER — HEPARIN SOD (PORK) LOCK FLUSH 100 UNIT/ML IV SOLN
500.0000 [IU] | INTRAVENOUS | Status: AC | PRN
  Administered 2012-12-19: 500 [IU]

## 2012-12-19 MED ORDER — WARFARIN SODIUM 5 MG PO TABS
5.0000 mg | ORAL_TABLET | Freq: Once | ORAL | Status: DC
  Filled 2012-12-19: qty 1

## 2012-12-19 MED ORDER — HYDROCODONE-ACETAMINOPHEN 5-325 MG PO TABS
2.0000 | ORAL_TABLET | ORAL | Status: AC | PRN
Start: 2012-12-19 — End: ?

## 2012-12-19 NOTE — Discharge Summary (Signed)
Physician Discharge Summary  KEEN EWALT ZOX:[id number] DOB: 1926/11/01 DOA: 12/15/2012  PCP: Ginette Otto, MD  Admit date: 12/15/2012 Discharge date: 12/19/2012  Time spent: >30 minutes  Recommendations for Outpatient Follow-up:      Follow-up Information   Follow up with Ginette Otto, MD. (in 1week, call for appt upon discharge)    Contact information:   [address] [place] [address] [phone]       Follow up with [facility], [name], MD. (as directed)    Contact information:   [address] [place] [address] [phone]        Discharge Diagnoses:  Principal Problem:   Pulmonary embolism Active Problems:   Renal cell cancer   History of prostate cancer   Thrombocytopenia   HTN (hypertension)   Hyperlipemia   Hip pain, chronic- right   Discharge Condition: improved/stable  Diet recommendation: heart healthy  Filed Weights   12/17/12 0550 12/18/12 0645 12/19/12 0540  Weight: 97.6 kg (215 lb 2.7 oz) 96.8 kg (213 lb 6.5 oz) 97.6 kg (215 lb 2.7 oz)    History of present illness:  TAYRON HUNNELL is a 79 y.o. 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 His images were reviewed at the cancer center and patient was advised to come to the ED. Patient has chronic right hip pain attributed to mild functioning right hip prosthesis? Fracture and is followed by orthopedics. In the last 4 weeks, patient's pain has progressively  gotten severe to a point where he is mostly sedentary on his reclining chair with very minimal activity. His pain is worse on movement rated as 10/10 in severity. No pain without movements. Orthopedic M.D. was waiting for cancer surveillance prior to proceeding with complicated surgery. Patient had 2-3 episodes of nonspecific mild fleeting left-sided chest pain which lasted a few seconds and resolved spontaneously without any associated factors. He denies dyspnea or asymmetrical leg swellings. In the ED, patient is hemodynamically stable and hospitalist admission is requested.   Hospital Course:  Pulmonary embolism, RLL  -Upon admission patient was started on Lovenox alone pending orthopedic evaluation. -Was seen by orthopedics-Dr. [name] yesterday 5/5 and he indicated no plans for surgery in recommended starting patient on are Coumadin. The Coumadin was started yesterday and today is day 2/5 of Coumadin Lovenox overlap, and his platelet count and hemoglobin have remained stable and he's had no gross evidence of bleeding -He'll be discharged at this time to followup with his PCP. Home health RN also to check his PT/INR and call to his PCP for adjustment of his Coumadin dose as appropriate. The Lovenox is to be discontinued after the 5day overlap with the INR remaining in therapeutic range of 2-3 Active Problems:  Hip pain, chronic- right  -chronic and progressively worsening: EDP discussed with primary orthopedic M.D. Dr. Norlene Campbell  -Patient was admitted for pain management, PT OT saw patient in recommended a home health PT services-to be as directed per Dr. [name] with stated that his plan is to limit weight bearing in the next several weeks and hope for spontaneous  healing. His to follow up with patient in the office.. -Per Dr. [name] no plans for surgery. Hypertension: Controlled. Continue home medications.  Chronic thrombocytopenia: stable- to account for she and 27 prior to  discharge the Transitional cell carcinoma genitourinary tract/indwelling Foley catheter: No metastatic disease on imaging on 12/13/12. Discussed with the oncologist per admitting MD. his to followup with his oncologist as previously directed.   Procedures:  NONE  Consultations:  Ortho  Discharge Exam: Filed Vitals:   12/18/12 1419 12/18/12 2051 12/19/12 0454 12/19/12 0540  BP: 108/64 106/64  131/80  Pulse: 76 82  74  Temp: 98 F (36.7 C) 98 F (36.7 C)  98.7 F (37.1 C)  TempSrc: Oral Oral Oral Oral  Resp: 20 19  19   Height:      Weight:    97.6 kg (215 lb 2.7 oz)  SpO2: 97% 97%  99%   Exam:  General: alert and oriented x3, in NAD  Cardiovascular: RRR  Respiratory: CTAB  Abdomen: soft +BS NT/ND  Extremities: no cyanosis and no edema     Discharge Instructions  Discharge Orders   Future Appointments Provider Department Dept Phone   01/19/2013 3:30 PM [facility] Nurse [facility] [phone]   03/02/2013 3:30 PM [facility] Nurse [facility] [phone]   04/13/2013 3:30 PM [facility] Nurse [facility] [phone]   Future Orders Complete By Expires     Diet - low sodium heart healthy  As directed     Discharge instructions  As directed     Comments:      Home health RN to check PT/INR in am 5/7 and daily till INR 2-3( today is day 2/5 of lovenox/coumadin overlap), RN to call INR results to PCP/Dr Stoneking    Increase activity slowly  As directed         Medication List    TAKE these medications       amLODipine 10 MG tablet  Commonly known as:  NORVASC  Take 0.5 tablets (5 mg total) by mouth every morning.     enoxaparin 100 MG/ML injection  Commonly known as:  LOVENOX  Inject 1 mL (100 mg total) into the skin every 12 (twelve) hours.     finasteride 5 MG tablet  Commonly known as:  PROSCAR  Take 5 mg by mouth daily after lunch daily after  lunch.     HYDROcodone-acetaminophen 5-325 MG per tablet  Commonly known as:  NORCO/VICODIN  Take 2 tablets by mouth every 4 (four) hours as needed for pain.     multivitamin with minerals Tabs  Take 1 tablet by mouth daily.     simvastatin 80 MG tablet  Commonly known as:  ZOCOR  Take 40 mg by mouth at bedtime.     warfarin 5 MG tablet  Commonly known as:  COUMADIN  Take 1 tablet (5 mg total) by mouth one time only at 6 PM.       Allergies  Allergen Reactions  . Morphine And Related Anxiety  . Penicillins Rash       Follow-up Information   Follow up with Ginette Otto, MD. (in 1week, call for appt upon discharge)    Contact information:   [address] [place] [address] [phone]       Follow up with [facility], [name], MD. (as directed)    Contact information:   [address]  [place] [address] [phone]        The results of significant diagnostics from this hospitalization (including imaging, microbiology, ancillary and laboratory) are listed below for reference.    Significant Diagnostic Studies: Ct Abdomen Pelvis W Wo Contrast  12/13/2012  *RADIOLOGY REPORT*  Clinical Data:  Bladder cancer.  Left renal carcinoma.  Lung metastasis.  Left nephrectomy.  CT CHEST WITH CONTRAST AND CT ABDOMEN AND PELVIS WITHOUT AND WITH CONTRAST  Technique: Multidetector CT imaging of the abdomen was performed without intravenous contrast. Multidetector CT imaging of the chest and abdomen was then performed during bolus administration of intravenous contrast.  Contrast: OMNIPAQUE IOHEXOL 300 MG/ML  SOLN  *RADIOLOGY REPORT*  Clinical Data:  CHEST CT WITHOUT CONTRAST  Technique:  Multidetector CT imaging of the chest was performed following the standard protocol without intravenous contrast. High resolution imaging of the lungs, as well as inspiratory and expiratory imaging, was performed.  Comparison:   None.  Findings:  IMPRESSION:   Comparison:  CT 06/15/2012,  nuclear medicine bone scan 04/25 1014, MRI pelvis 11/22/2012  CT CHEST  Findings:  There is a port in the right anterior chest wall.  There is no axillary or supraclavicular lymphadenopathy.  No mediastinal or hilar lymphadenopathy.  Coronary calcifications are present. There is a filling defect within the right lower lobe pulmonary artery which extends to the segmental branches (image [id number]) consistent with pulmonary embolism.  No suspicious pulmonary nodules.  IMPRESSION: 1.  No evidence of thoracic metastasis. 2.  Right lower lobe pulmonary embolism.  CT ABDOMEN AND PELVIS  Findings:  Patient status post left nephrectomy.  No nodularity within the nephrectomy bed.  No enhancing lesion in the right kidney.  There is a nonenhancing cortical cyst again demonstrated.  There is no focal hepatic lesion.  Gallbladder, pancreas, spleen, adrenal glands are normal.  There is a moderate size hiatal hernia.  The stomach, small bowel, and colon are normal.  There are diverticula of descending colon. There is a Foley catheter in the bladder.  No retroperitoneal periportal lymphadenopathy.  No filling defect within the right renal collecting system on delayed imaging.  Review of the bone windows demonstrates significant degenerative change of the spine.  No aggressive osseous lesions.  Avascular necrosis noted within the left femoral head.  IMPRESSION:  1.  No evidence of local recurrence in the left nephrectomy bed. 2.  No evidence of the bladder cancer metastasis. 3.  Avascular necrosis of the left femoral head. 4.  Foley catheter in the bladder.  Bladder poorly assessed.  Findings conveyed to  Dr. Venda Rodes on 12/13/2012 at 12:45 p.m.   Original Report Authenticated By: Genevive Bi, M.D.    Ct Chest W Contrast  12/13/2012  *RADIOLOGY REPORT*  Clinical Data:  Bladder cancer.  Left renal carcinoma.  Lung metastasis.  Left nephrectomy.  CT CHEST WITH CONTRAST AND CT ABDOMEN AND PELVIS WITHOUT  AND WITH CONTRAST  Technique: Multidetector CT imaging of the abdomen was performed without intravenous contrast. Multidetector CT imaging of the chest and abdomen was then performed during bolus administration of intravenous contrast.  Contrast: OMNIPAQUE IOHEXOL 300 MG/ML  SOLN  *RADIOLOGY REPORT*  Clinical Data:  CHEST CT WITHOUT CONTRAST  Technique:  Multidetector CT imaging of the chest was performed following the standard protocol without intravenous contrast. High resolution imaging of the lungs, as well as inspiratory and expiratory imaging, was performed.  Comparison:   None.  Findings:  IMPRESSION:  Comparison:  CT 06/15/2012,  nuclear medicine bone scan 04/25 1014, MRI pelvis 11/22/2012  CT CHEST  Findings:  There is a port in the right anterior chest wall.  There is no axillary or supraclavicular lymphadenopathy.  No mediastinal or hilar lymphadenopathy.  Coronary calcifications are present. There is a filling defect within the right lower lobe pulmonary artery which extends to the segmental branches (image [id number]) consistent with pulmonary embolism.  No suspicious pulmonary nodules.  IMPRESSION: 1.  No evidence of thoracic metastasis. 2.  Right lower lobe pulmonary embolism.  CT ABDOMEN AND PELVIS  Findings:  Patient status post left nephrectomy.  No nodularity within the nephrectomy bed.  No enhancing lesion in the right kidney.  There is a nonenhancing cortical cyst again demonstrated.  There is no focal hepatic lesion.  Gallbladder, pancreas, spleen, adrenal glands are normal.  There is a moderate size hiatal hernia.  The stomach, small bowel, and colon are normal.  There are diverticula of descending colon. There is a Foley catheter in the bladder.  No retroperitoneal periportal lymphadenopathy.  No filling defect within the right renal collecting system on delayed imaging.  Review of the bone windows demonstrates significant degenerative change of the spine.  No aggressive osseous lesions.   Avascular necrosis noted within the left femoral head.  IMPRESSION:  1.  No evidence of local recurrence in the left nephrectomy bed. 2.  No evidence of the bladder cancer metastasis. 3.  Avascular necrosis of the left femoral head. 4.  Foley catheter in the bladder.  Bladder poorly assessed.  Findings conveyed to  Dr. Venda Rodes on 12/13/2012 at 12:45 p.m.   Original Report Authenticated By: Genevive Bi, M.D.    Mr Hip Right Wo Contrast  11/22/2012  *RADIOLOGY REPORT*  Clinical Data: Status post right hip replacement and 2002 with recent onset pain.  MRI OF THE RIGHT HIP WITHOUT CONTRAST  Technique:  Multiplanar, multisequence MR imaging was performed. No intravenous contrast was administered.  Comparison: CT abdomen and pelvis 06/15/2012.  Findings: Metal artifact reduction protocol (MARS) was used on the study.  Right hip replacement is identified.  No fluid collection about the right hip is seen.  No evidence of osteolysis is identified.  There is no fracture.  The hip is located.  Signal abnormality throughout the left femoral head is consistent with avascular necrosis as seen on the prior CT scan.  No flattening of the left femoral head is identified.  There is no notable degenerative change about the left hip and no joint effusion is seen.  No evidence of bursitis is present.  Pelvic musculature appears intact and symmetric bilaterally.  Imaged intrapelvic contents demonstrate extensive sigmoid diverticulosis.  IMPRESSION:  1.  Right total hip replacement without evidence of complication. 2.  Chronic avascular necrosis left femoral head without femoral head collapse or evidence of degenerative disease. 3.  Extensive sigmoid diverticulosis.   Original Report Authenticated By: Holley Dexter, M.D.    Nm Bone Scan 3 Phase Lower Extremity  12/08/2012  *RADIOLOGY REPORT*  Clinical Data: Right hip pain extending to the right knee.  Right hip replacement 14 years ago.  History prostate cancer.  NUCLEAR  MEDICINE THREE PHASE BONE SCAN  Technique:  Radionuclide angiographic images, immediate static blood pool images, and 3-hour delayed static images were obtained after intravenous injection of radiopharmaceutical. Imaging was performed of the hips and knees.  Radiopharmaceutical: CURIE TC-MDP TECHNETIUM TC 69M MEDRONATE IV KIT  Comparison: MRI dated 11/22/2012  Findings: Flow phase images appear normal and symmetric.  Immediate static images demonstrate mildly increased activity anterior to the proximal portion of the implant on the right.  The delayed images demonstrate abnormal activity tracking around the stem of the right proximal femoral implant.  There is known avascular necrosis of the left femoral head, but not enough to give a discernible photopenic defect.  Delayed and immediate static images were obtained through the knees, and demonstrate no significant abnormal activity. Photopenia related to the knee implants noted.  IMPRESSION:  1.  Abnormal high blood pool and delayed activity around the stem of the right hip implant.  Given that this implant was placed 14 years ago, the appearance is suspicious for loosening or infection.   Original Report Authenticated By: Gaylyn Rong, M.D.     Microbiology: No results found for this or any previous visit (from the past 240 hour(s)).   Labs: Basic Metabolic Panel:  Recent Labs Lab 12/13/12 0931 12/15/12 1450 12/18/12 0330  NA 136 133* 136  K 4.7 3.9 4.3  CL 97* 98 101  CO2 29 27 28   GLUCOSE 97 98 90  BUN 22.9 18 21   CREATININE 1.3 0.97 1.04  CALCIUM 9.3 8.8 8.9   Liver Function Tests:  Recent Labs Lab 12/13/12 0931  AST 14  ALT 17  ALKPHOS 63  BILITOT 0.89  PROT 6.8  ALBUMIN 3.4*   No results found for this basename: LIPASE, AMYLASE,  in the last 168 hours No results found for this basename: AMMONIA,  in the last 168 hours CBC:  Recent Labs Lab 12/13/12 0931 12/15/12 1450 12/16/12 0435 12/18/12 0330  12/19/12 0425  WBC 7.5 9.7 6.7 5.9 4.6  NEUTROABS 5.1  --   --   --   --   HGB 15.0 13.0 12.2* 12.4* 12.1*  HCT 44.7 38.2* 36.8* 37.6* 37.4*  MCV 90.6 89.3 90.9 90.2 90.3  PLT 125* 98* 111* 129* 127*   Cardiac Enzymes: No results found for this basename: CKTOTAL, CKMB, CKMBINDEX, TROPONINI,  in the last 168 hours BNP: BNP (last 3 results) No results found for this basename: PROBNP,  in the last 8760 hours CBG: No results found for this basename: GLUCAP,  in the last 168 hours     Signed:  Claudia Alvizo C  [facility] 12/19/2012, 1:27 PM

## 2012-12-19 NOTE — Progress Notes (Signed)
Patient discharge to home, wife at bedside. Discharge instructions and follow up appointment done in the presence of the wife. Lovenox teaching done, patient able to self administer without difficulty. Port deaccessed  By IV Charity fundraiser. Discharge to home no complaints of any pain or discomfort.

## 2012-12-19 NOTE — Progress Notes (Addendum)
ANTICOAGULATION CONSULT NOTE - Initial Consult  Pharmacy Consult for Warfarin Indication: VTE treatment  Allergies  Allergen Reactions  . Morphine And Related Anxiety  . Penicillins Rash    Patient Measurements: Height: 6' (182.9 cm) Weight: 215 lb 2.7 oz (97.6 kg) IBW/kg (Calculated) : 77.6 Heparin Dosing Weight:   Vital Signs: Temp: 98.7 F (37.1 C) (05/06 0540) Temp src: Oral (05/06 0540) BP: 131/80 mmHg (05/06 0540) Pulse Rate: 74 (05/06 0540)  Labs:  Recent Labs  12/18/12 0330 12/19/12 0425  HGB 12.4* 12.1*  HCT 37.6* 37.4*  PLT 129* 127*  LABPROT  --  12.9  INR  --  0.98  CREATININE 1.04  --     Estimated Creatinine Clearance: 65.2 ml/min (by C-G formula based on Cr of 1.04).   Medical History: Past Medical History  Diagnosis Date  . Hypertension   . Chronic kidney disease 06/15/11    bladder cancer  . Blood transfusion   . Arthritis   . Anemia 08/16/2011  . transitional cell of genitourinary tract dx'd 2011    invasive tumor of lt renal pelvis  . Prostate cancer dx'd 07/2001    brachytherapy  . Metastasis to lung   . 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 amLODipine  5 mg Oral Daily  . atorvastatin  40 mg Oral q1800  . docusate sodium  100 mg Oral BID  . enoxaparin (LOVENOX) injection  100 mg Subcutaneous Q12H  . finasteride  5 mg Oral QPC lunch  . multivitamin with minerals  1 tablet Oral Daily  . patient's guide to using coumadin book   Does not apply Once  . sodium chloride  3 mL Intravenous Q12H  . [COMPLETED] warfarin  7.5 mg Oral Once  . warfarin   Does not apply Once  . Warfarin - Pharmacist Dosing Inpatient   Does not apply q1800   Infusions:   PRN: acetaminophen, acetaminophen, albuterol, HYDROcodone-acetaminophen, HYDROmorphone (DILAUDID) injection, ondansetron (ZOFRAN) IV, ondansetron, sodium chloride  Assessment: 79 yo M with  acute PE per CT on LMWH and warfarin started 5/5, Warfarin tx delayed for ortho eval (done 5/5) - pt s/p right THR 14 years ago, now inc hip pain - MRI/CT shows some erosion of proximal stem of femoral prothesis, it is well fixed distally. Question of occult stress fracture. No immediate plans for surgery. Of note, patient has h/o transitional cell carcinoma of GU tract (invading L renal pelvix s/p L nephroureterectomy   Day #2 of minimum 5 day overlap for acute PE with therapeutic INR for min 24h (per VTE core measure)  INR this am = 0.98 following first dose of coumadin last pm  CBC: Hgb=12.1 (stable), plts = 127 (stable)  No bleeding noted  Goal of Therapy:  INR 2-3 Monitor platelets by anticoagulation protocol: Yes   Plan:   Repeat Coumadin 5mg  x 1 tonight   Continue enoxaparin 1mg /kg q12h as ordered per MD  Daily PT/INR, CBC  Warfarin education  If discharged, suggest home with warfarin 5mg  daily with INR f/u Thur, will need lovenox 100mg  SQ BID for minimum 4 days.   Juliette Alcide, PharmD, BCPS.   Pager: [phone] 12/19/2012,8:26 AM

## 2013-01-01 ENCOUNTER — Emergency Department (HOSPITAL_COMMUNITY)
Admission: EM | Admit: 2013-01-01 | Discharge: 2013-01-01 | Disposition: A | Discharge status: Home / Self Care | Payer: Medicare Other | Attending: Emergency Medicine | Admitting: Emergency Medicine

## 2013-01-01 ENCOUNTER — Emergency Department (HOSPITAL_COMMUNITY): Payer: Medicare Other

## 2013-01-01 ENCOUNTER — Encounter (HOSPITAL_COMMUNITY): Payer: Self-pay | Admitting: *Deleted

## 2013-01-01 DIAGNOSIS — Z9089 Acquired absence of other organs: Secondary | ICD-10-CM | POA: Insufficient documentation

## 2013-01-01 DIAGNOSIS — D649 Anemia, unspecified: Secondary | ICD-10-CM | POA: Insufficient documentation

## 2013-01-01 DIAGNOSIS — Z7901 Long term (current) use of anticoagulants: Secondary | ICD-10-CM | POA: Insufficient documentation

## 2013-01-01 DIAGNOSIS — Z8546 Personal history of malignant neoplasm of prostate: Secondary | ICD-10-CM | POA: Insufficient documentation

## 2013-01-01 DIAGNOSIS — Z8553 Personal history of malignant neoplasm of renal pelvis: Secondary | ICD-10-CM | POA: Insufficient documentation

## 2013-01-01 DIAGNOSIS — I129 Hypertensive chronic kidney disease with stage 1 through stage 4 chronic kidney disease, or unspecified chronic kidney disease: Secondary | ICD-10-CM | POA: Insufficient documentation

## 2013-01-01 DIAGNOSIS — Z8739 Personal history of other diseases of the musculoskeletal system and connective tissue: Secondary | ICD-10-CM | POA: Insufficient documentation

## 2013-01-01 DIAGNOSIS — Z86711 Personal history of pulmonary embolism: Secondary | ICD-10-CM | POA: Insufficient documentation

## 2013-01-01 DIAGNOSIS — M25559 Pain in unspecified hip: Secondary | ICD-10-CM | POA: Insufficient documentation

## 2013-01-01 DIAGNOSIS — Z9221 Personal history of antineoplastic chemotherapy: Secondary | ICD-10-CM | POA: Insufficient documentation

## 2013-01-01 DIAGNOSIS — R0789 Other chest pain: Secondary | ICD-10-CM | POA: Insufficient documentation

## 2013-01-01 DIAGNOSIS — C7800 Secondary malignant neoplasm of unspecified lung: Secondary | ICD-10-CM | POA: Insufficient documentation

## 2013-01-01 DIAGNOSIS — Z880 Allergy status to penicillin: Secondary | ICD-10-CM | POA: Insufficient documentation

## 2013-01-01 DIAGNOSIS — N189 Chronic kidney disease, unspecified: Secondary | ICD-10-CM | POA: Insufficient documentation

## 2013-01-01 DIAGNOSIS — Z87891 Personal history of nicotine dependence: Secondary | ICD-10-CM | POA: Insufficient documentation

## 2013-01-01 DIAGNOSIS — Z8659 Personal history of other mental and behavioral disorders: Secondary | ICD-10-CM

## 2013-01-01 DIAGNOSIS — G8929 Other chronic pain: Secondary | ICD-10-CM | POA: Insufficient documentation

## 2013-01-01 DIAGNOSIS — C159 Malignant neoplasm of esophagus, unspecified: Secondary | ICD-10-CM

## 2013-01-01 DIAGNOSIS — Z79899 Other long term (current) drug therapy: Secondary | ICD-10-CM | POA: Insufficient documentation

## 2013-01-01 HISTORY — DX: Malignant neoplasm of esophagus, unspecified: C15.9

## 2013-01-01 LAB — CBC
Hemoglobin: 11.9 g/dL — ABNORMAL LOW (ref 13.0–17.0)
Platelets: 181 10*3/uL (ref 150–400)
RBC: 3.98 MIL/uL — ABNORMAL LOW (ref 4.22–5.81)
WBC: 7.6 10*3/uL (ref 4.0–10.5)

## 2013-01-01 LAB — HEPATIC FUNCTION PANEL
ALT: 21 U/L (ref 0–53)
Alkaline Phosphatase: 54 U/L (ref 39–117)
Total Bilirubin: 0.3 mg/dL (ref 0.3–1.2)
Total Protein: 5.9 g/dL — ABNORMAL LOW (ref 6.0–8.3)

## 2013-01-01 LAB — BASIC METABOLIC PANEL
CO2: 26 mEq/L (ref 19–32)
Glucose, Bld: 111 mg/dL — ABNORMAL HIGH (ref 70–99)
Potassium: 4.4 mEq/L (ref 3.5–5.1)
Sodium: 138 mEq/L (ref 135–145)

## 2013-01-01 MED ORDER — SODIUM CHLORIDE 0.9 % IV BOLUS (SEPSIS)
500.0000 mL | Freq: Once | INTRAVENOUS | Status: AC
  Administered 2013-01-01: 500 mL via INTRAVENOUS

## 2013-01-01 MED ORDER — LORAZEPAM 2 MG/ML IJ SOLN
0.5000 mg | Freq: Once | INTRAMUSCULAR | Status: AC
  Administered 2013-01-01: 0.5 mg via INTRAVENOUS
  Filled 2013-01-01: qty 1

## 2013-01-01 MED ORDER — ONDANSETRON HCL 4 MG/2ML IJ SOLN
4.0000 mg | Freq: Once | INTRAMUSCULAR | Status: AC
  Administered 2013-01-01: 4 mg via INTRAVENOUS
  Filled 2013-01-01: qty 2

## 2013-01-01 MED ORDER — GI COCKTAIL ~~LOC~~
30.0000 mL | Freq: Once | ORAL | Status: AC
  Administered 2013-01-01: 30 mL via ORAL
  Filled 2013-01-01: qty 30

## 2013-01-01 MED ORDER — HYDROCODONE-ACETAMINOPHEN 5-325 MG PO TABS
2.0000 | ORAL_TABLET | Freq: Once | ORAL | Status: AC
  Administered 2013-01-01: 2 via ORAL
  Filled 2013-01-01: qty 2

## 2013-01-01 NOTE — ED Provider Notes (Signed)
History     CSN: [id number]  Arrival date & time 01/01/13  6440   First MD Initiated Contact with Patient 01/01/13 [phone]      Chief Complaint  Patient presents with  . Chest Pain   HPI [name] is a 79 y.o. 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 Patient is thought to be in remission, he had a followup CAT scan done performed on 12/15/12 which found an incidental right lower lobe PE.  Since then the patient has been on chronic anticoagulation, he was transitioned to Coumadin however was awaiting a right hip replacement revision, and has been transitioning back to Lovenox prior to having surgery tomorrow at [facility].  Patient has been very nervous and anxious concerning surgery he was told this may be a long surgery 6-8 hours secondary to the extensive revision required.    Early this morning, the patient woke up as he typically does at about 2:00, tried to roll over on his left hand side and noticed that he had some dull 3-4/10 chest pain, nonradiating, since been intermittent it has been resolving, currently a 1-2/10. He thinks this may be related to his anxiety over the surgery.  Patient has no shortness of breath, no abdominal pain, he said he has some mild nausea, no vomiting, no diarrhea, no diaphoresis. Patient has no known coronary artery disease, no family history of coronary artery disease, history of hyperlipidemia well-controlled on simvastatin, no hypertension, no diabetes, patient does not smoke.  Patient has chronic right hip pain this is worse only with movement, it typically does not hurt unless he is moving. Patient also has a chronic indwelling Foley catheter secondary to his Nephroureterectomy.   Past Medical History  Diagnosis Date  . Hypertension   . Chronic kidney  disease 06/15/11    bladder cancer  . Blood transfusion   . Arthritis   . Anemia 08/16/2011  . transitional cell of genitourinary tract dx'd 2011    invasive tumor of lt renal pelvis  . Prostate cancer dx'd 07/2001    brachytherapy  . Metastasis to lung   . Papillary transitional cell carcinoma, renal dx'd 05/2010    papillary invasive hi grade urothelial ca    Past Surgical History  Procedure Laterality Date  . Colon surgery    . Appendectomy    . Joint replacement    . Vascular surgery      aortic anurysm repair  . Nephrectomy      left kidney removed Sept 2011  . Arcuate keratectomy    . Bladder surgery      Bx of bladder x4  . Prostate biopsy      Prostate seeds/radiation  . Cystoscopy  06/21/2011    Procedure: CYSTOSCOPY;  Surgeon: Crecencio Mc, MD;  Location: [facility];  Service: Urology;  Laterality: N/A;  . Transurethral resection of bladder tumor  06/21/2011    Procedure: TRANSURETHRAL RESECTION OF BLADDER TUMOR (TURBT);  Surgeon: Crecencio Mc, MD;  Location: [facility];  Service: Urology;  Laterality: N/A;  . Transurethral resection of prostate  06/21/2011    Procedure: TRANSURETHRAL RESECTION OF THE PROSTATE (TURP);  Surgeon: Crecencio Mc, MD;  Location: [facility];  Service: Urology;  Laterality: N/A;  . Cystoscopy with urethral dilatation  06/21/2011    Procedure: CYSTOSCOPY WITH URETHRAL DILATATION;  Surgeon: Crecencio Mc, MD;  Location: [facility];  Service: Urology;  Laterality: N/A;    History reviewed. No pertinent family history.  History  Substance Use Topics  . Smoking status: Former Smoker    Types: Cigarettes    Quit date: 07/16/1983  . Smokeless tobacco: Never Used  . Alcohol Use: No      Review of Systems At least 10pt or greater review of systems completed and are negative except where specified in the HPI.  Allergies  Morphine and related and Penicillins  Home Medications   Current Outpatient Rx  Name  Route  Sig  Dispense  Refill  . amLODipine (NORVASC)  10 MG tablet   Oral   Take 0.5 tablets (5 mg total) by mouth every morning.   30 tablet   0   . enoxaparin (LOVENOX) 100 MG/ML injection   Subcutaneous   Inject 1 mL (100 mg total) into the skin every 12 (twelve) hours.   10 Syringe   0   . finasteride (PROSCAR) 5 MG tablet   Oral   Take 5 mg by mouth daily after lunch daily after lunch.           Marland Kitchen HYDROcodone-acetaminophen (NORCO/VICODIN) 5-325 MG per tablet   Oral   Take 2 tablets by mouth every 4 (four) hours as needed for pain.   30 tablet   0   . Multiple Vitamin (MULTIVITAMIN WITH MINERALS) TABS   Oral   Take 1 tablet by mouth daily.         . simvastatin (ZOCOR) 80 MG tablet   Oral   Take 40 mg by mouth at bedtime.          Marland Kitchen warfarin (COUMADIN) 5 MG tablet   Oral   Take 1 tablet (5 mg total) by mouth one time only at 6 PM.   30 tablet   0     There were no vitals taken for this visit.  Physical Exam  Nursing notes reviewed.  Electronic medical record reviewed. VITAL SIGNS:   Filed Vitals:   01/01/13 0600 01/01/13 0615 01/01/13 0630 01/01/13 0645  BP: 115/80 122/69 122/62 111/59  Pulse: 93   80  Temp:      TempSrc:      Resp: 23   12  SpO2: 100%   100%   CONSTITUTIONAL: Awake, oriented, appears non-toxic HENT: Atraumatic, normocephalic, oral mucosa pink and moist, airway patent. Cold sore left nare. Nares patent without drainage. External ears normal. EYES: Conjunctiva clear, EOMI, PERRLA NECK: Trachea midline, non-tender, supple CARDIOVASCULAR: Normal heart rate, Normal rhythm, No murmurs, rubs, gallops PULMONARY/CHEST: Clear to auscultation, no rhonchi, wheezes, or rales. Symmetrical breath sounds. Non-tender. ABDOMINAL: Non-distended, soft, non-tender - mild tenderness in the left lower quadrant, no rebound or guarding. Well-healed midline laparotomy scar. BS normal. Small amounts of bruises yellow and purple secondary to Lovenox administration in the lower abdomen. NEUROLOGIC: Non-focal,  moving all four extremities, no gross sensory or motor deficits. EXTREMITIES: No clubbing, cyanosis, or edema. Tenderness when mobilizing the right hip. SKIN: Warm, Dry, No erythema, No rash, innumerable actinic keratoses   ED Course  Procedures (including critical care time)  Date: 01/01/2013  Rate: 86  Rhythm: normal sinus rhythm  QRS Axis: Left  Intervals: normal  ST/T Wave abnormalities: normal  Conduction Disutrbances: Left anterior fascicular block  Narrative Interpretation: No significant change when compared with prior EKG from 12/15/2012. Patient did previously have a first degree AV block no longer evident, nonischemic  EKG  Labs Reviewed  CBC - Abnormal; Notable for the following:    RBC 3.98 (*)    Hemoglobin 11.9 (*)    HCT 36.5 (*)    All other components within normal limits  BASIC METABOLIC PANEL - Abnormal; Notable for the following:    Glucose, Bld 111 (*)    BUN 36 (*)    GFR calc non Af Amer 81 (*)    All other components within normal limits  HEPATIC FUNCTION PANEL - Abnormal; Notable for the following:    Total Protein 5.9 (*)    Albumin 2.8 (*)    All other components within normal limits  PROTIME-INR  LIPASE, BLOOD  TROPONIN I   Dg Chest Port 1 View  01/01/2013   *RADIOLOGY REPORT*  Clinical Data: Chest pain.  PORTABLE CHEST - 1 VIEW  Comparison: 10/25/2011.  Chest CT 12/13/2012  Findings: The power port is stable.  Stable cardiac enlargement and tortuous aorta.  Persistent underlying chronic bronchitic type lung changes with superimposed atelectasis but no definite infiltrates or effusions.  IMPRESSION: Chronic underlying lung changes and stable cardiac enlargement. Streaky subsegmental atelectasis.   Original Report Authenticated By: Rudie Meyer, M.D.     No diagnosis found.    MDM  RICKY GALLERY is a 79 y.o. male presenting with atypical left-sided intermittent mild chest pain. Patient feels like this is anxiety related to upcoming surgery.   EKG is nonischemic, patient is currently being treated for PE on the right, this was found as an incidental.  Patient currently is in no respiratory distress, is not hypoxic, he is not tachypneic, is not tachycardic. Bowel sounds are stable and within normal limits. He is afebrile, nontoxic.  Patient is not concerned about his chest discomfort at this time, is more concerned about feeling agitated secondary to his surroundings.  Treated the patient with low-dose Ativan, and scheduled pain medicine, Norco x2 she typically takes at home for his right hip pain.  EKG is unremarkable, troponin is unremarkable as well.  Labs are essentially at baseline, slight elevation in patient's BUN to 39, baseline appears to be in the high 20s. Give the patient a small amount of fluid  for some possible mild dehydration. Patient has denied any hematochezia or melena. He denies any other symptoms consistent GI bleed, including dizziness, his hemoglobin is stable. Patient's INR has drifted down appropriately to 1.0. Patient is had his last Lovenox dose today in the hospital at 5:55 AM.  Pt had similar intermittent chest pains while hospitalized at [facility] for his initial PE workup earlier this month.  Doubt ACS based on H&P and clinical data to this point.  Plan will be to rule patient out the emergency department, he is low risk for ACS, patient is resting comfortably on reevaluation, we'll check a troponin and repeat EKG at 8:30 AM, after that that is normal in patient's symptom free he may be discharged home to followup with his scheduled surgery tomorrow.  Dr. Radford Pax to evaluate second EKG and troponin         Jones Skene, MD 01/01/13 [phone]

## 2013-01-01 NOTE — ED Notes (Addendum)
Pt in via EMS, per EMS- pt in c/o left lower chest pain, states that he has been having this pain since diagnosed with PE in left lower lobe two week ago, this morning when patient woke up he started have some episodes of diaphoresis and states that pain had increased, pt also states that pain has moved higher in chest, rating pain 4/10 upon arrival, ASA prior to arrival, pt has hip replacement scheduled for tomorrow

## 2013-01-01 NOTE — ED Notes (Signed)
Bed:WA23<BR> Expected date:<BR> Expected time:<BR> Means of arrival:<BR> Comments:<BR> EMS

## 2013-01-17 ENCOUNTER — Telehealth: Payer: Self-pay | Admitting: *Deleted

## 2013-01-17 ENCOUNTER — Telehealth: Payer: Self-pay | Admitting: Oncology

## 2013-01-17 NOTE — Telephone Encounter (Signed)
Spoke with wife carol, let her know that kr shadad is aware of patient's new diagnosis, and he need to get stronger before we can do anything. But, we will see him before 03/02/13. Wife verbalized understanding.

## 2013-01-17 NOTE — Telephone Encounter (Signed)
Spoke with daughter cindy, patient back at [facility], hip replacement not done at [facility], d/t new esophageal mass found, note to dr Alver Fisher desk.

## 2013-01-22 ENCOUNTER — Telehealth: Payer: Self-pay | Admitting: Oncology

## 2013-01-24 ENCOUNTER — Telehealth: Payer: Self-pay | Admitting: *Deleted

## 2013-01-24 NOTE — Telephone Encounter (Signed)
Lm informing the pt that his appt time had changed. gv appt d/t for 01/30/13 for a 2:45pm lab and 3;15pm ov. Pt is aware...td

## 2013-01-30 ENCOUNTER — Telehealth: Payer: Self-pay | Admitting: Oncology

## 2013-01-30 ENCOUNTER — Other Ambulatory Visit (HOSPITAL_BASED_OUTPATIENT_CLINIC_OR_DEPARTMENT_OTHER): Payer: Medicare Other

## 2013-01-30 ENCOUNTER — Ambulatory Visit (HOSPITAL_BASED_OUTPATIENT_CLINIC_OR_DEPARTMENT_OTHER): Payer: Medicare Other | Admitting: Oncology

## 2013-01-30 ENCOUNTER — Other Ambulatory Visit: Payer: Self-pay | Admitting: Oncology

## 2013-01-30 VITALS — BP 109/64 | HR 94 | Temp 98.1°F | Resp 17 | Ht 72.0 in | Wt 215.0 lb

## 2013-01-30 DIAGNOSIS — C7800 Secondary malignant neoplasm of unspecified lung: Secondary | ICD-10-CM

## 2013-01-30 DIAGNOSIS — C649 Malignant neoplasm of unspecified kidney, except renal pelvis: Secondary | ICD-10-CM

## 2013-01-30 DIAGNOSIS — C159 Malignant neoplasm of esophagus, unspecified: Secondary | ICD-10-CM

## 2013-01-30 DIAGNOSIS — C801 Malignant (primary) neoplasm, unspecified: Secondary | ICD-10-CM

## 2013-01-30 LAB — CBC WITH DIFFERENTIAL/PLATELET
Eosinophils Absolute: 0.1 10*3/uL (ref 0.0–0.5)
HCT: 28.6 % — ABNORMAL LOW (ref 38.4–49.9)
LYMPH%: 23.1 % (ref 14.0–49.0)
MCHC: 33.5 g/dL (ref 32.0–36.0)
MCV: 87.1 fL (ref 79.3–98.0)
MONO#: 0.5 10*3/uL (ref 0.1–0.9)
NEUT#: 3 10*3/uL (ref 1.5–6.5)
NEUT%: 62.2 % (ref 39.0–75.0)
Platelets: 150 10*3/uL (ref 140–400)
WBC: 4.9 10*3/uL (ref 4.0–10.3)

## 2013-01-30 LAB — COMPREHENSIVE METABOLIC PANEL (CC13)
BUN: 13.1 mg/dL (ref 7.0–26.0)
CO2: 25 mEq/L (ref 22–29)
Creatinine: 0.9 mg/dL (ref 0.7–1.3)
Glucose: 141 mg/dl — ABNORMAL HIGH (ref 70–99)
Total Bilirubin: 0.28 mg/dL (ref 0.20–1.20)

## 2013-01-30 NOTE — Telephone Encounter (Signed)
gv adn printed appt sched and avs for pt.[name] for [name] in radiation...advised pt cs will call with d/t for PET

## 2013-01-30 NOTE — Progress Notes (Signed)
Hematology and Oncology Follow Up Visit  [name] [id number] [birth year] 79 y.o. 01/30/2013 4:44 PM   CC: [name], MD  [name], MD   Principle Diagnosis: This is a pleasant 79 year old gentleman with the following issues:  1. 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 Now has metastatic disease with lung nodules. 2. Prostate cancer: He is s/p treatment with brachytherapy in December 2002. He has had no evidence for cancer recurrence since treatment. 3. Recent diagnosis of esophageal adenocarcinoma presented with a 4.2 cm GE junction tumor diagnosed in May of 2014.  Prior Therapy:  1. He underwent an open left nephroureterectomy done on June 04, 2010. His pathology from that showed [phone], showed that he had a papillary invasive high-grade urothelial carcinoma with negative urinary bladder cuff margins. The tumor was 3.8 cm. No lymphovascular invasion. Again, was a T2 NX disease.  S/P the six cycle of  chemotherapy of Carboplatin and Gemzar. Day 8 cycle 6 was on 4/18.  2. He is S/P the six cycle of  chemotherapy of Carboplatin and Gemzar. Completed on 12/02/2011.   Current therapy:  Observation and follow up.   Interim History: [name] presents for a follow up visit. He completed the sixth cycle of chemotherapy in 11/2011 and  have recovered well from that. Since his last visit, he was hospitalized at [facility] for the possible evaluation for hip operation. During his hospitalization, he was found to have a 4.2 cm GE junction tumor that was biopsied and found to be an anal carcinoma. He was discharged to a skilled nursing facility with that his hip operation being done and currently wheelchair-bound.He is really asymptomatic at this time from his esophageal tumor including dysphasia  or dyspepsia He is no longer reporting any fever. He did not report any abdominal pain. He had not  reported any chest pain. Had not reported any hematuria. Had not reported any flank pain.  His hip operation have been on hold given his recent finding of esophageal cancer. Patient presented today for a followup visit to discuss his treatment options.  Medications: I have reviewed the patient's current medications. Current Outpatient Prescriptions  Medication Sig Dispense Refill  . pantoprazole (PROTONIX) 40 MG tablet Take 40 mg by mouth daily. Take 1 tablet (40 mg total) by mouth 2 times daily.      Marland Kitchen senna (SENOKOT) 8.6 MG tablet Take 8.6 mg by mouth at bedtime. Take 1 tablet (8.6 mg total) by mouth nightly.      Marland Kitchen ALPRAZolam (XANAX) 0.25 MG tablet Take 0.25 mg by mouth 3 (three) times daily as needed for anxiety.      Marland Kitchen amLODipine (NORVASC) 10 MG tablet Take 0.5 tablets (5 mg total) by mouth every morning.  30 tablet  0  . enoxaparin (LOVENOX) 100 MG/ML injection Inject 1 mL (100 mg total) into the skin every 12 (twelve) hours.  10 Syringe  0  . finasteride (PROSCAR) 5 MG tablet Take 5 mg by mouth daily after lunch daily after lunch.        Marland Kitchen HYDROcodone-acetaminophen (NORCO/VICODIN) 5-325 MG per tablet Take 2 tablets by mouth every 4 (four) hours as needed for pain.  30 tablet  0  . Multiple Vitamin (MULTIVITAMIN WITH MINERALS) TABS Take 1 tablet by mouth daily.      . simvastatin (ZOCOR) 80 MG tablet Take 40 mg by mouth at bedtime.       Marland Kitchen  warfarin (COUMADIN) 5 MG tablet Take 1 tablet (5 mg total) by mouth one time only at 6 PM.  30 tablet  0   No current facility-administered medications for this visit.    Allergies:  Allergies  Allergen Reactions  . Morphine And Related Anxiety  . Penicillins Rash    Past Medical History, Surgical history, Social history, and Family History were reviewed and updated.  Review of Systems: Constitutional:  Negative for fever, chills, night sweats, anorexia, weight loss, pain. Cardiovascular: no chest pain or dyspnea on exertion Respiratory: no  cough, shortness of breath, or wheezing Neurological: no TIA or stroke symptoms Dermatological: negative ENT: negative Skin: Negative. Gastrointestinal: no abdominal pain, change in bowel habits, or black or bloody stools Genito-Urinary: no dysuria, trouble voiding, or hematuria Hematological and Lymphatic: negative Breast: negative Musculoskeletal: negative Remaining ROS negative.  Physical Exam: Blood pressure 109/64, pulse 94, temperature 98.1 F (36.7 C), temperature source Oral, resp. rate 17, height 6' (1.829 m), weight 215 lb (97.523 kg), SpO2 98.00%. ECOG: 1 General appearance: alert Head: Normocephalic, without obvious abnormality, atraumatic Neck: no adenopathy, no carotid bruit, no JVD, supple, symmetrical, trachea midline and thyroid not enlarged, symmetric, no tenderness/mass/nodules Lymph nodes: Cervical, supraclavicular, and axillary nodes normal. Heart:regular rate and rhythm, S1, S2 normal, no murmur, click, rub or gallop Lung:chest clear, no wheezing, rales, normal symmetric air entry Abdomen: soft, non-tender, without masses or organomegaly EXT:no erythema, induration, or nodules   Lab Results: Lab Results  Component Value Date   WBC 4.9 01/30/2013   HGB 9.6* 01/30/2013   HCT 28.6* 01/30/2013   MCV 87.1 01/30/2013   PLT 150 01/30/2013     Chemistry      Component Value Date/Time   NA 140 01/30/2013 1449   NA 138 01/01/2013 0535   K 3.8 01/30/2013 1449   K 4.4 01/01/2013 0535   CL 104 01/30/2013 1449   CL 101 01/01/2013 0535   CO2 25 01/30/2013 1449   CO2 26 01/01/2013 0535   BUN 13.1 01/30/2013 1449   BUN 36* 01/01/2013 0535   CREATININE 0.9 01/30/2013 1449   CREATININE 0.79 01/01/2013 0535      Component Value Date/Time   CALCIUM 8.6 01/30/2013 1449   CALCIUM 9.1 01/01/2013 0535   ALKPHOS 67 01/30/2013 1449   ALKPHOS 54 01/01/2013 0535   AST 11 01/30/2013 1449   AST 15 01/01/2013 0535   ALT 7 01/30/2013 1449   ALT 21 01/01/2013 0535   BILITOT 0.28 01/30/2013 1449    BILITOT 0.3 01/01/2013 0535     Impression and Plan:  This is a pleasant 79 year old gentleman with the following issues:   1. 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 He had a documented invasive tumor of the left renal pelvis, status post left nephroureterectomy, now with recurrence into the bladder neck area, biopsy proven to be muscle invasive. His last CT scan on April 30 did not really show any active disease but certainly he is at a high risk of recurrent disease.  2. New finding of a distal esophageal cancer adenocarcinoma and pathology: I discussed the natural course of this particular disease as well as the treatment option. I do not think in this gentleman that treatment would be curative. He is not a candidate for a try modality therapy with chemotherapy, radiation and surgery. I think given his age his 2 other malignancies and his hip issues of the oral cannot possibly palliative radiation therapy with oral Xeloda. However I  will stage him with a PET/CT scan also asked for a consultation from Dr. Roselind Messier and will make a decision accordingly.  3. History of prostate cancer: not active at this point.  4. 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 He is hoping to have the surgery to correct that but I think with recent findings of esophageal cancer Patrice plans on hold for now. I discussed with him that if he is indeed advanced esophageal cancer would be reasonable to go with more palliative approach and not consider that surgery. At this time he does not have any active pain but he is wheelchair bound because of it.      SHADAD,FIRAS 6/17/20144:44 PM

## 2013-01-31 ENCOUNTER — Telehealth: Payer: Self-pay | Admitting: Oncology

## 2013-01-31 NOTE — Telephone Encounter (Signed)
lvm for pt regarding to 6.25.14 appt @ 3:00 with Dr. Roselind Messier

## 2013-02-06 ENCOUNTER — Encounter: Payer: Self-pay | Admitting: *Deleted

## 2013-02-06 NOTE — Progress Notes (Signed)
GI Location of Tumor / Histology: esophagus at GE junction  Patient presented 2 months ago with symptoms of: found incidently during hospitalization in [facility], May 2014  Biopsies of esophagus, GE junction (if applicable) revealed: moderately differentiated adenocarcinoma, 4.2 cm tumor  Past/Anticipated interventions by surgeon, if any: none  Past/Anticipated interventions by medical oncology, if any: possibly oral Xeloda w/palliative radiation  Weight changes, if any: lost 10 lbs past 4 months  Bowel/Bladder complaints, if any: none  Nausea / Vomiting, if any: none  Pain issues, if any:  Pain in right hip that pt states is from hip replacement 12 years ago that is no longer viable. Pt went to [facility] for surgery when his esophageal cancer was found. He was d/c to [facility]. Pt cannot bear any weight on right leg. Pt denies loss of appetite, GERD, nausea, vomiting. Wife, daughter, son in law w/pt today.   SAFETY ISSUES:  Prior radiation? 05/08/2001 - 06/09/2001 prostate, Iodine-125 implant 07/28/2001  Pacemaker/ICD? no  Possible current pregnancy? na  Is the patient on methotrexate? no  Current Complaints / other details:  02/14/13 PET scan: mass in distal esophagus, enlarged gastrohepatic ligament lymph node

## 2013-02-07 ENCOUNTER — Ambulatory Visit: Payer: Medicare Other

## 2013-02-07 ENCOUNTER — Ambulatory Visit: Payer: Medicare Other | Admitting: Radiation Oncology

## 2013-02-13 ENCOUNTER — Encounter (HOSPITAL_COMMUNITY)
Admission: RE | Admit: 2013-02-13 | Discharge: 2013-02-13 | Disposition: A | Discharge status: Home / Self Care | Payer: Medicare Other | Source: Ambulatory Visit | Attending: Oncology | Admitting: Oncology

## 2013-02-13 DIAGNOSIS — K229 Disease of esophagus, unspecified: Secondary | ICD-10-CM | POA: Diagnosis not present

## 2013-02-13 DIAGNOSIS — C7800 Secondary malignant neoplasm of unspecified lung: Secondary | ICD-10-CM

## 2013-02-13 DIAGNOSIS — C3490 Malignant neoplasm of unspecified part of unspecified bronchus or lung: Secondary | ICD-10-CM | POA: Diagnosis present

## 2013-02-13 DIAGNOSIS — C159 Malignant neoplasm of esophagus, unspecified: Secondary | ICD-10-CM

## 2013-02-13 DIAGNOSIS — K8020 Calculus of gallbladder without cholecystitis without obstruction: Secondary | ICD-10-CM | POA: Diagnosis not present

## 2013-02-13 LAB — GLUCOSE, CAPILLARY: Glucose-Capillary: 97 mg/dL (ref 70–99)

## 2013-02-13 MED ORDER — FLUDEOXYGLUCOSE F - 18 (FDG) INJECTION
17.5000 | Freq: Once | INTRAVENOUS | Status: AC | PRN
  Administered 2013-02-13: 17.5 via INTRAVENOUS

## 2013-02-14 ENCOUNTER — Telehealth: Payer: Self-pay | Admitting: *Deleted

## 2013-02-14 ENCOUNTER — Ambulatory Visit (HOSPITAL_BASED_OUTPATIENT_CLINIC_OR_DEPARTMENT_OTHER): Payer: Medicare Other | Admitting: Oncology

## 2013-02-14 VITALS — BP 111/67 | HR 90 | Temp 97.4°F | Resp 18 | Ht 72.0 in | Wt 217.0 lb

## 2013-02-14 DIAGNOSIS — C801 Malignant (primary) neoplasm, unspecified: Secondary | ICD-10-CM

## 2013-02-14 DIAGNOSIS — C649 Malignant neoplasm of unspecified kidney, except renal pelvis: Secondary | ICD-10-CM

## 2013-02-14 DIAGNOSIS — C7800 Secondary malignant neoplasm of unspecified lung: Secondary | ICD-10-CM

## 2013-02-14 MED ORDER — FENTANYL 25 MCG/HR TD PT72: 1.0000 | MEDICATED_PATCH | TRANSDERMAL | Status: AC

## 2013-02-14 NOTE — Telephone Encounter (Signed)
sw pt wife gv appt d/t for 03/07/13 labs @3pm  and ov@3 :30pm. She is aware that i will mail a letter/avs as well...td

## 2013-02-14 NOTE — Telephone Encounter (Signed)
Faxed order to [facility], to [address] nurse to D/C physical therapy for now. [phone], rec'd confirmation of fax.

## 2013-02-14 NOTE — Progress Notes (Signed)
Hematology and Oncology Follow Up Visit  HUNT ZAJICEK [id number] Apr 26, 1927 79 y.o. 02/14/2013 9:10 AM   CC: [name], MD  [name], MD   Principle Diagnosis: This is a pleasant 79 year old gentleman with the following issues:  1. 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 Now has metastatic disease with lung nodules. 2. Prostate cancer: He is s/p treatment with brachytherapy in December 2002. He has had no evidence for cancer recurrence since treatment. 3. Recent diagnosis of esophageal adenocarcinoma presented with a 4.2 X 6 mc cm GE junction tumor diagnosed in May of 2014.  Prior Therapy:  1. He underwent an open left nephroureterectomy done on June 04, 2010. His pathology from that showed [phone], showed that he had a papillary invasive high-grade urothelial carcinoma with negative urinary bladder cuff margins. The tumor was 3.8 cm. No lymphovascular invasion. Again, was a T2 NX disease.  S/P the six cycle of  chemotherapy of Carboplatin and Gemzar. Day 8 cycle 6 was on 4/18.  2. He is S/P the six cycle of  chemotherapy of Carboplatin and Gemzar. Completed on 12/02/2011.   Current therapy:  Observation and follow up.   Interim History: Mr. Chesnut presents for a follow up visit. He completed the sixth cycle of chemotherapy in 11/2011 and  have recovered well from that. Since his last visit, No major changes in his health. No difficulty swallowing but increased pain in his right hip has been noted. No relief with hydrocodone. His hip operation have been on hold given his recent finding of esophageal cancer. Patient presented today for a followup visit to discuss his treatment options.  Medications: I have reviewed the patient's current medications. Current Outpatient Prescriptions  Medication Sig Dispense Refill  . ALPRAZolam (XANAX) 0.25 MG tablet Take 0.25 mg by mouth 3 (three)  times daily as needed for anxiety.      Marland Kitchen amLODipine (NORVASC) 10 MG tablet Take 0.5 tablets (5 mg total) by mouth every morning.  30 tablet  0  . enoxaparin (LOVENOX) 100 MG/ML injection Inject 1 mL (100 mg total) into the skin every 12 (twelve) hours.  10 Syringe  0  . fentaNYL (DURAGESIC - DOSED MCG/HR) 25 MCG/HR Place 1 patch (25 mcg total) onto the skin every 3 (three) days.  10 patch  0  . finasteride (PROSCAR) 5 MG tablet Take 5 mg by mouth daily after lunch daily after lunch.        Marland Kitchen HYDROcodone-acetaminophen (NORCO/VICODIN) 5-325 MG per tablet Take 2 tablets by mouth every 4 (four) hours as needed for pain.  30 tablet  0  . Multiple Vitamin (MULTIVITAMIN WITH MINERALS) TABS Take 1 tablet by mouth daily.      . pantoprazole (PROTONIX) 40 MG tablet Take 40 mg by mouth daily. Take 1 tablet (40 mg total) by mouth 2 times daily.      Marland Kitchen senna (SENOKOT) 8.6 MG tablet Take 8.6 mg by mouth at bedtime. Take 1 tablet (8.6 mg total) by mouth nightly.      . simvastatin (ZOCOR) 80 MG tablet Take 40 mg by mouth at bedtime.       Marland Kitchen warfarin (COUMADIN) 5 MG tablet Take 1 tablet (5 mg total) by mouth one time only at 6 PM.  30 tablet  0   No current facility-administered medications for this visit.    Allergies:  Allergies  Allergen Reactions  . Morphine And Related  Anxiety  . Penicillins Rash    Past Medical History, Surgical history, Social history, and Family History were reviewed and updated.  Review of Systems: Constitutional:  Negative for fever, chills, night sweats, anorexia, weight loss, pain. Cardiovascular: no chest pain or dyspnea on exertion Respiratory: no cough, shortness of breath, or wheezing Neurological: no TIA or stroke symptoms Dermatological: negative ENT: negative Skin: Negative. Gastrointestinal: no abdominal pain, change in bowel habits, or black or bloody stools Genito-Urinary: no dysuria, trouble voiding, or hematuria Hematological and Lymphatic:  negative Breast: negative Musculoskeletal: negative Remaining ROS negative.  Physical Exam: Blood pressure 111/67, pulse 90, temperature 97.4 F (36.3 C), temperature source Oral, resp. rate 18, height 6' (1.829 m), weight 217 lb (98.431 kg). ECOG: 2 General appearance: alert Head: Normocephalic, without obvious abnormality, atraumatic Neck: no adenopathy, no carotid bruit, no JVD, supple, symmetrical, trachea midline and thyroid not enlarged, symmetric, no tenderness/mass/nodules Lymph nodes: Cervical, supraclavicular, and axillary nodes normal. Heart:regular rate and rhythm, S1, S2 normal, no murmur, click, rub or gallop Lung:chest clear, no wheezing, rales, normal symmetric air entry Abdomen: soft, non-tender, without masses or organomegaly EXT:no erythema, induration, or nodules   Lab Results: Lab Results  Component Value Date   WBC 4.9 01/30/2013   HGB 9.6* 01/30/2013   HCT 28.6* 01/30/2013   MCV 87.1 01/30/2013   PLT 150 01/30/2013     Chemistry      Component Value Date/Time   NA 140 01/30/2013 1449   NA 138 01/01/2013 0535   K 3.8 01/30/2013 1449   K 4.4 01/01/2013 0535   CL 104 01/30/2013 1449   CL 101 01/01/2013 0535   CO2 25 01/30/2013 1449   CO2 26 01/01/2013 0535   BUN 13.1 01/30/2013 1449   BUN 36* 01/01/2013 0535   CREATININE 0.9 01/30/2013 1449   CREATININE 0.79 01/01/2013 0535      Component Value Date/Time   CALCIUM 8.6 01/30/2013 1449   CALCIUM 9.1 01/01/2013 0535   ALKPHOS 67 01/30/2013 1449   ALKPHOS 54 01/01/2013 0535   AST 11 01/30/2013 1449   AST 15 01/01/2013 0535   ALT 7 01/30/2013 1449   ALT 21 01/01/2013 0535   BILITOT 0.28 01/30/2013 1449   BILITOT 0.3 01/01/2013 0535     NUCLEAR MEDICINE PET SKULL BASE TO THIGH  Fasting Blood Glucose: 97  Technique: 17.5 mCi F-18 FDG was injected intravenously. CT data  was obtained and used for attenuation correction and anatomic  localization only. (This was not acquired as a diagnostic CT  examination.) Additional  exam technical data entered on  technologist worksheet.  Comparison: 07/20/2011  Findings:  Neck: No hypermetabolic lymph node or mass within the neck.  Chest: There is a mass within the distal esophagus. This measures  6.1 x 3.9 x 6.3 cm. There is intense FDG uptake associated this  mass within SUV max equal to 20.4 appear.  No hypermetabolic mediastinal or hilar lymph nodes identified.  Moderate cardiac enlargement. No pericardial or pleural effusion  identified. No hypermetabolic pulmonary nodule or mass identified.  Abdomen/Pelvis: There is no abnormal uptake within the liver  parenchyma. Stones are noted within the gallbladder. The pancreas  is unremarkable. The spleen is normal. Normal appearance of the  adrenal glands. Solitary right kidney noted. There are seed  implants identified within the prostate gland.  Postoperative change involving the abdominal aorta. There is a  gastrohepatic ligament lymph node which measure 1 cm. Mild  increased FDG uptake is associated with this  lymph node within SUV  max equal to 5.8. No additional enlarged or hypermetabolic lymph  nodes identified within the abdomen or pelvis.  Skeleton: There are no specific features identified to suggest  bone metastases.  IMPRESSION:  1. Mass within the distal esophagus is identified and concerning  for primary esophageal neoplasm.  2. Borderline enlarged gastrohepatic ligament lymph node is  identified and may represent an area of local metastatic disease    Impression and Plan:  This is a pleasant 79 year old gentleman with the following issues:   1. 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 He had a documented invasive tumor of the left renal pelvis, status post left nephroureterectomy, now with recurrence into the bladder neck area, biopsy proven to be muscle invasive. PET/CT did not show relapse.  2. New finding of a distal esophageal cancer adenocarcinoma and pathology:  PET/CT results discussed today in details with the patient and family. I discussed the natural course of this particular disease as well as the treatment option. I do not think in this gentleman that treatment would be curative. He is not a candidate for a try modality therapy with chemotherapy, radiation and surgery. I think given his age his 2 other malignancies and his hip issues of the only cannot possibly palliative radiation therapy with oral Xeloda.  He understands that this will be palliative in nature only. The alternative will be hospice route and supportive care only.  3. History of prostate cancer: not active at this point.  4. Chronic hip pain: He had a long standing history of hip pain with a malfunctioning prosthesis which caused him to be chronically debilitated as of late and unable to be a ambulatory. He is hoping to have the surgery to correct that but I think with recent findings of esophageal cancer Patrice plans on hold for now.  I feel given the above findings, I recommend conservative approach with pain control only.  I gave a Rx for Fentanyl patch given his constant pain.   5. Prognosis: overall poor with limited life expectancy. I feel even with treatment, he has limited amount of months to live and he is suitable for hospice care.       Tammela Bales 7/2/20149:10 AM

## 2013-02-15 ENCOUNTER — Encounter: Payer: Self-pay | Admitting: *Deleted

## 2013-02-15 DIAGNOSIS — C159 Malignant neoplasm of esophagus, unspecified: Secondary | ICD-10-CM | POA: Insufficient documentation

## 2013-02-19 ENCOUNTER — Other Ambulatory Visit: Payer: Self-pay | Admitting: Radiation Oncology

## 2013-02-19 ENCOUNTER — Encounter: Payer: Self-pay | Admitting: Radiation Oncology

## 2013-02-19 ENCOUNTER — Ambulatory Visit
Admission: RE | Admit: 2013-02-19 | Discharge: 2013-02-19 | Disposition: A | Discharge status: Home / Self Care | Payer: Medicare Other | Source: Ambulatory Visit | Attending: Radiation Oncology | Admitting: Radiation Oncology

## 2013-02-19 VITALS — BP 112/76 | HR 75 | Temp 98.5°F | Resp 20

## 2013-02-19 DIAGNOSIS — C159 Malignant neoplasm of esophagus, unspecified: Secondary | ICD-10-CM

## 2013-02-19 DIAGNOSIS — I129 Hypertensive chronic kidney disease with stage 1 through stage 4 chronic kidney disease, or unspecified chronic kidney disease: Secondary | ICD-10-CM | POA: Insufficient documentation

## 2013-02-19 DIAGNOSIS — M25559 Pain in unspecified hip: Secondary | ICD-10-CM | POA: Insufficient documentation

## 2013-02-19 DIAGNOSIS — Z8546 Personal history of malignant neoplasm of prostate: Secondary | ICD-10-CM | POA: Insufficient documentation

## 2013-02-19 DIAGNOSIS — Z85528 Personal history of other malignant neoplasm of kidney: Secondary | ICD-10-CM | POA: Insufficient documentation

## 2013-02-19 DIAGNOSIS — C160 Malignant neoplasm of cardia: Secondary | ICD-10-CM | POA: Insufficient documentation

## 2013-02-19 DIAGNOSIS — Z79899 Other long term (current) drug therapy: Secondary | ICD-10-CM | POA: Insufficient documentation

## 2013-02-19 DIAGNOSIS — N189 Chronic kidney disease, unspecified: Secondary | ICD-10-CM | POA: Insufficient documentation

## 2013-02-19 NOTE — Progress Notes (Signed)
Please see the Nurse Progress Note in the MD Initial Consult Encounter for this patient. 

## 2013-02-19 NOTE — Progress Notes (Signed)
Radiation Oncology         ([phone]) [phone] ________________________________  Initial outpatient Consultation  Name: [name] MRN: [id number]  Date: 02/19/2013  DOB: 25-Jul-1927  WU:[id number],GNF Brian Fus, MD  Benjiman Core, MD   REFERRING PHYSICIAN: Benjiman Core, MD  DIAGNOSIS: The encounter diagnosis was Esophageal cancer.  HISTORY OF PRESENT ILLNESS::[name] is a 79 y.o. male who is seen out courtesy of Dr. Clelia Croft for an opinion concerning radiation therapy as part of the management of patient's recently diagnosed adenocarcinoma of the distal esophagus.  Recently the patient was undergoing evaluation at [facility] for right hip replacement.  Imaging at [facility] revealed possible lesion within the chest and chest CT scan confirmed the these findings. imaging at this institution is not available for review at this time. Patient did undergo 3 endoscopy procedures. According to family members on the third endoscopy the patient was noted to have a mass within the distal esophagus this was biopsied and revealed moderately differentiated adenocarcinoma.  Once the patient was discharged from [facility] he proceeded to undergo PET scan which did reveal activity in the distal esophagus and possible metastatic spread to a gastrohepatic ligament lymph node.  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 He did develop recurrence of this problem in the bladder neck area for which he is been receiving chemotherapy. Patient also has a prior history of prostate cancer which appears to be in remission.   PREVIOUS RADIATION THERAPY: Yes the patient underwent external beam radiation therapy directed at the prostate area to 45 gray. Patient then proceeded with iodine 125 radioactive seed implantation boost. Dose from the patient's implant was 90 gray. Cumulative dose to  the prostate was 135 gray.  PAST MEDICAL HISTORY:  has a past medical history of Hypertension; Chronic kidney disease (06/15/11); Blood transfusion; Arthritis; Anemia (08/16/2011); transitional cell of genitourinary tract (dx'd 2011); Prostate cancer (dx'd 07/2001); Metastasis to lung; Papillary transitional cell carcinoma, renal (dx'd 05/2010); and Esophageal cancer (01/01/13).    PAST SURGICAL HISTORY: Past Surgical History  Procedure Laterality Date  . Colon surgery    . Appendectomy    . Joint replacement    . Vascular surgery      aortic anurysm repair  . Nephrectomy      left kidney removed Sept 2011  . Arcuate keratectomy    . Bladder surgery      Bx of bladder x4  . Prostate biopsy      Prostate seeds/radiation  . Cystoscopy  06/21/2011    Procedure: CYSTOSCOPY;  Surgeon: Crecencio Mc, MD;  Location: [facility];  Service: Urology;  Laterality: N/A;  . Transurethral resection of bladder tumor  06/21/2011    Procedure: TRANSURETHRAL RESECTION OF BLADDER TUMOR (TURBT);  Surgeon: Crecencio Mc, MD;  Location: [facility];  Service: Urology;  Laterality: N/A;  . Transurethral resection of prostate  06/21/2011    Procedure: TRANSURETHRAL RESECTION OF THE PROSTATE (TURP);  Surgeon: Crecencio Mc, MD;  Location: [facility];  Service: Urology;  Laterality: N/A;  . Cystoscopy with urethral dilatation  06/21/2011    Procedure: CYSTOSCOPY WITH URETHRAL DILATATION;  Surgeon: Crecencio Mc, MD;  Location: [facility];  Service: Urology;  Laterality: N/A;    FAMILY HISTORY: family history is not on file.  SOCIAL HISTORY:  reports that he quit smoking about 29 years ago. His smoking use included Cigarettes. He smoked 0.00  packs per day. He has never used smokeless tobacco. He reports that he does not drink alcohol.  ALLERGIES: Morphine and related and Penicillins  MEDICATIONS:  Current Outpatient Prescriptions  Medication Sig Dispense Refill  . acetaminophen (TYLENOL) 500 MG chewable tablet Chew 500 mg by mouth every 6  (six) hours as needed for pain.      Marland Kitchen ALPRAZolam (XANAX) 0.25 MG tablet Take 0.25 mg by mouth 3 (three) times daily as needed for anxiety.      . enoxaparin (LOVENOX) 100 MG/ML injection Inject 1 mL (100 mg total) into the skin every 12 (twelve) hours.  10 Syringe  0  . ergocalciferol (VITAMIN D2) 50000 UNITS capsule Take 50,000 Units by mouth once a week.      . escitalopram (LEXAPRO) 5 MG tablet Take 5 mg by mouth daily.      . fentaNYL (DURAGESIC - DOSED MCG/HR) 25 MCG/HR Place 1 patch (25 mcg total) onto the skin every 3 (three) days.  10 patch  0  . lidocaine (LIDODERM) 5 % Place 1 patch onto the skin daily. Remove & Discard patch within 12 hours or as directed by MD      . Multiple Vitamin (MULTIVITAMIN WITH MINERALS) TABS Take 1 tablet by mouth daily.      Marland Kitchen oxycodone (OXY-IR) 5 MG capsule Take 5 mg by mouth every 4 (four) hours as needed.      . pantoprazole (PROTONIX) 40 MG tablet Take 40 mg by mouth daily. Take 1 tablet (40 mg total) by mouth 2 times daily.      . pravastatin (PRAVACHOL) 20 MG tablet Take 20 mg by mouth daily.      Marland Kitchen senna (SENOKOT) 8.6 MG tablet Take 8.6 mg by mouth at bedtime. Take 1 tablet (8.6 mg total) by mouth nightly.      Marland Kitchen amLODipine (NORVASC) 10 MG tablet Take 0.5 tablets (5 mg total) by mouth every morning.  30 tablet  0  . finasteride (PROSCAR) 5 MG tablet Take 5 mg by mouth daily after lunch daily after lunch.        Marland Kitchen HYDROcodone-acetaminophen (NORCO/VICODIN) 5-325 MG per tablet Take 2 tablets by mouth every 4 (four) hours as needed for pain.  30 tablet  0  . warfarin (COUMADIN) 5 MG tablet Take 1 tablet (5 mg total) by mouth one time only at 6 PM.  30 tablet  0   No current facility-administered medications for this encounter.    REVIEW OF SYSTEMS:  A 15 point review of systems is documented in the electronic medical record. This was obtained by the nursing staff. However, I reviewed this with the patient to discuss relevant findings and make appropriate  changes.  He has significant right hip pain with any pelvic movement. He is unable to bear weight on his right lower extremity.  He denies any swallowing difficulties or pain with swallowing   PHYSICAL EXAM:  oral temperature is 98.5 F (36.9 C). His blood pressure is 112/76 and his pulse is 75. His respiration is 20.   this is a very pleasant 79 year old gentleman in no acute distress. He is sitting in the hospital wheelchair. He is accompanied by his wife daughter and son on evaluation.  Examination of the neck and supraclavicular region reveals no evidence of adenopathy. The axillary areas are free of adenopathy. Examination of the lungs reveals them to be clear. The heart has regular rhythm and rate. The abdomen is soft and nontender with normal bowel sounds.  LABORATORY DATA:  Lab Results  Component Value Date   WBC 4.9 01/30/2013   HGB 9.6* 01/30/2013   HCT 28.6* 01/30/2013   MCV 87.1 01/30/2013   PLT 150 01/30/2013   Lab Results  Component Value Date   NA 140 01/30/2013   K 3.8 01/30/2013   CL 104 01/30/2013   CO2 25 01/30/2013   Lab Results  Component Value Date   ALT 7 01/30/2013   AST 11 01/30/2013   ALKPHOS 67 01/30/2013   BILITOT 0.28 01/30/2013     RADIOGRAPHY: Nm Pet Image Restag (ps) Skull Base To Thigh  02/13/2013   *RADIOLOGY REPORT*  Clinical Data: Subsequent treatment strategy for lung cancer.  NUCLEAR MEDICINE PET SKULL BASE TO THIGH  Fasting Blood Glucose:  97  Technique:  17.5 mCi F-18 FDG was injected intravenously. CT data was obtained and used for attenuation correction and anatomic localization only.  (This was not acquired as a diagnostic CT examination.) Additional exam technical data entered on technologist worksheet.  Comparison:  07/20/2011  Findings:  Neck: No hypermetabolic lymph node or mass within the neck.  Chest:  There is a mass within the distal esophagus.  This measures 6.1 x 3.9 x 6.3 cm.  There is intense FDG uptake associated this mass within SUV max equal  to 20.4 appear.  No hypermetabolic mediastinal or hilar lymph nodes identified. Moderate cardiac enlargement.  No pericardial or pleural effusion identified.  No hypermetabolic pulmonary nodule or mass identified.  Abdomen/Pelvis:  There is no abnormal uptake within the liver parenchyma.  Stones are noted within the gallbladder.  The pancreas is unremarkable.  The spleen is normal.  Normal appearance of the adrenal glands.  Solitary right kidney noted.  There are seed implants identified within the prostate gland.  Postoperative change involving the abdominal aorta.  There is a gastrohepatic ligament lymph node which measure 1 cm.  Mild increased FDG uptake is associated with this lymph node within SUV max equal to 5.8.  No additional enlarged or hypermetabolic lymph nodes identified within the abdomen or pelvis.  Skeleton:  There are no specific features identified to suggest bone metastases.  IMPRESSION:  1.  Mass within the distal esophagus is identified and concerning for primary esophageal neoplasm. 2.  Borderline enlarged gastrohepatic ligament lymph node is identified and may represent an area of local metastatic disease.   Original Report Authenticated By: Signa Kell, M.D.      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  Patient has multiple medical issues as dictated above and his performance status is severely limited by his right hip pain.  I discussed the consideration for palliative ration therapy directed at the distal esophagus along with mild chemotherapy. The intent of this would be to delay development of dysphasia which I anticipate would develop within the next 6-8 weeks without any treatment. The other option would be to proceed with hospice  PLAN: The patient is undecided which treatment approach she would like to proceed with. He is tentatively scheduled for simulation and  planning tomorrow but will call tomorrow morning with his final decision concerning treatment.  I spent 60 minutes minutes face to face with the patient and more than 50% of that time was spent in counseling and/or coordination of care.   ------------------------------------------------  -----------------------------------  Billie Lade, PhD, MD

## 2013-02-20 ENCOUNTER — Telehealth: Payer: Self-pay | Admitting: *Deleted

## 2013-02-20 ENCOUNTER — Ambulatory Visit: Payer: Medicare Other | Admitting: Radiation Oncology

## 2013-02-20 NOTE — Progress Notes (Signed)
   Department of Radiation Oncology  Phone:  [phone] Fax:        [phone]  The patient's daughter called back this morning. After careful consideration [name] has decided against radiation and radiosensitizing chemotherapy for management of his esophageal cancer.  He will proceed with hospice support.  -----------------------------------  Billie Lade, PhD, MD

## 2013-02-20 NOTE — Addendum Note (Signed)
Encounter addended by: Billie Lade, MD on: 02/20/2013  2:28 PM<BR>     Documentation filed: Clinical Notes, Notes Section

## 2013-02-20 NOTE — Telephone Encounter (Signed)
Nurse yevette calling from hospice, patient to be discharged from [facility] tomorrow and they would like [facility] [place]. Per dr Clelia Croft, he will be the attending and hospice physicians may do symptom management. yevette verbalized understanding.

## 2013-02-22 ENCOUNTER — Other Ambulatory Visit: Payer: Self-pay | Admitting: *Deleted

## 2013-02-22 ENCOUNTER — Telehealth: Payer: Self-pay | Admitting: *Deleted

## 2013-02-22 MED ORDER — OXYCODONE HCL 5 MG PO CAPS: 5.0000 mg | ORAL_CAPSULE | ORAL | Status: AC | PRN

## 2013-02-22 MED ORDER — ALPRAZOLAM 0.5 MG PO TABS: 0.5000 mg | ORAL_TABLET | Freq: Every day | ORAL | Status: AC

## 2013-02-22 NOTE — Telephone Encounter (Signed)
Signed Scripts for xanax and oxycodone faxed to [facility] [address] [facility] [address]. Per hospice request.

## 2013-02-22 NOTE — Telephone Encounter (Signed)
Hospice nurse calling to request we increase oxycodone  5 mg to 1-2 tablets q 4 hours, ok per dr Clelia Croft.

## 2013-02-23 ENCOUNTER — Telehealth: Payer: Self-pay | Admitting: *Deleted

## 2013-02-23 MED ORDER — FENTANYL 50 MCG/HR TD PT72: 1.0000 | MEDICATED_PATCH | TRANSDERMAL | Status: AC

## 2013-02-23 NOTE — Telephone Encounter (Signed)
Increased duragesic patch to 50 mcg every 72 hours, d/t pain at "10" , while on 25 mcg patch and oxycodone 10 mg every 4 hours.. Faxed new script for 50 mcg to [facility] [address] [facility]. Hospice nurse carol notified.

## 2013-02-26 ENCOUNTER — Telehealth: Payer: Self-pay | Admitting: *Deleted

## 2013-02-26 NOTE — Telephone Encounter (Signed)
PT.'S FAMILY WOULD LIKE PT. TO BE CHANGED FROM LOVENOX TO COUMADIN. PT.'S FAMILY WOULD LIKE A DNR IN PLACE AT THE HOME. THIS NOTE TO KRISTIN CURCIO,NP.

## 2013-02-26 NOTE — Telephone Encounter (Signed)
Per kristin, okay for hospice physicians to sign DNR for the home and will ask dr Clelia Croft re: coumadin vs the lovenox injection, when he returns to the office tomorrow.

## 2013-03-05 ENCOUNTER — Encounter: Payer: Self-pay | Admitting: *Deleted

## 2013-03-05 NOTE — Progress Notes (Signed)
Per hospice, patient expired at 1535 on 03/22/2013, dr shadad notified, all future appts cancelled.

## 2013-03-07 ENCOUNTER — Other Ambulatory Visit: Payer: Medicare Other | Admitting: Lab

## 2013-03-07 ENCOUNTER — Ambulatory Visit: Payer: Medicare Other | Admitting: Oncology

## 2013-03-16 DEATH — deceased

## 2014-04-09 ENCOUNTER — Other Ambulatory Visit: Payer: Self-pay | Admitting: Pharmacist
